# Patient Record
Sex: Female | Born: 1950 | Race: White | Hispanic: No | Marital: Married | Smoking: Never smoker
Health system: Southern US, Community
[De-identification: ages and names within clinical notes are randomized; demographics above are authoritative.]

## PROBLEM LIST (undated history)

## (undated) DIAGNOSIS — G4730 Sleep apnea, unspecified: Secondary | ICD-10-CM

## (undated) DIAGNOSIS — J189 Pneumonia, unspecified organism: Secondary | ICD-10-CM

## (undated) DIAGNOSIS — F0390 Unspecified dementia without behavioral disturbance: Secondary | ICD-10-CM

## (undated) DIAGNOSIS — K08109 Complete loss of teeth, unspecified cause, unspecified class: Secondary | ICD-10-CM

## (undated) DIAGNOSIS — K222 Esophageal obstruction: Secondary | ICD-10-CM

## (undated) DIAGNOSIS — I499 Cardiac arrhythmia, unspecified: Secondary | ICD-10-CM

## (undated) DIAGNOSIS — L723 Sebaceous cyst: Secondary | ICD-10-CM

## (undated) DIAGNOSIS — I480 Paroxysmal atrial fibrillation: Secondary | ICD-10-CM

## (undated) DIAGNOSIS — K219 Gastro-esophageal reflux disease without esophagitis: Secondary | ICD-10-CM

## (undated) DIAGNOSIS — I10 Essential (primary) hypertension: Secondary | ICD-10-CM

## (undated) DIAGNOSIS — I5022 Chronic systolic (congestive) heart failure: Secondary | ICD-10-CM

## (undated) DIAGNOSIS — R0600 Dyspnea, unspecified: Secondary | ICD-10-CM

## (undated) DIAGNOSIS — Z972 Presence of dental prosthetic device (complete) (partial): Secondary | ICD-10-CM

## (undated) HISTORY — PX: ESOPHAGOGASTRODUODENOSCOPY (EGD) WITH ESOPHAGEAL DILATION: SHX5812

## (undated) HISTORY — DX: Gastro-esophageal reflux disease without esophagitis: K21.9

## (undated) HISTORY — PX: BREAST SURGERY: SHX581

## (undated) HISTORY — DX: Sebaceous cyst: L72.3

---

## 1998-06-13 ENCOUNTER — Other Ambulatory Visit: Admission: RE | Admit: 1998-06-13 | Discharge: 1998-06-13 | Payer: Self-pay | Admitting: Obstetrics and Gynecology

## 1998-10-11 ENCOUNTER — Ambulatory Visit (HOSPITAL_COMMUNITY): Admission: RE | Admit: 1998-10-11 | Discharge: 1998-10-11 | Payer: Self-pay | Admitting: Gastroenterology

## 1998-10-11 ENCOUNTER — Encounter: Payer: Self-pay | Admitting: Gastroenterology

## 1998-11-02 ENCOUNTER — Ambulatory Visit (HOSPITAL_COMMUNITY): Admission: RE | Admit: 1998-11-02 | Discharge: 1998-11-02 | Payer: Self-pay | Admitting: Gastroenterology

## 1999-06-20 ENCOUNTER — Ambulatory Visit (HOSPITAL_COMMUNITY): Admission: RE | Admit: 1999-06-20 | Discharge: 1999-06-20 | Payer: Self-pay | Admitting: Gastroenterology

## 1999-07-11 ENCOUNTER — Ambulatory Visit (HOSPITAL_COMMUNITY): Admission: RE | Admit: 1999-07-11 | Discharge: 1999-07-11 | Payer: Self-pay | Admitting: Gastroenterology

## 2000-05-28 ENCOUNTER — Ambulatory Visit (HOSPITAL_COMMUNITY): Admission: RE | Admit: 2000-05-28 | Discharge: 2000-05-28 | Payer: Self-pay | Admitting: Gastroenterology

## 2000-06-03 ENCOUNTER — Encounter: Payer: Self-pay | Admitting: Emergency Medicine

## 2000-06-03 ENCOUNTER — Encounter: Admission: RE | Admit: 2000-06-03 | Discharge: 2000-06-03 | Payer: Self-pay | Admitting: Emergency Medicine

## 2000-06-18 ENCOUNTER — Ambulatory Visit (HOSPITAL_COMMUNITY): Admission: RE | Admit: 2000-06-18 | Discharge: 2000-06-18 | Payer: Self-pay | Admitting: Emergency Medicine

## 2000-07-09 ENCOUNTER — Ambulatory Visit (HOSPITAL_COMMUNITY): Admission: RE | Admit: 2000-07-09 | Discharge: 2000-07-09 | Payer: Self-pay | Admitting: Gastroenterology

## 2001-08-21 ENCOUNTER — Ambulatory Visit (HOSPITAL_COMMUNITY): Admission: RE | Admit: 2001-08-21 | Discharge: 2001-08-21 | Payer: Self-pay | Admitting: Gastroenterology

## 2002-08-17 ENCOUNTER — Other Ambulatory Visit: Admission: RE | Admit: 2002-08-17 | Discharge: 2002-08-17 | Payer: Self-pay | Admitting: Obstetrics and Gynecology

## 2002-09-24 ENCOUNTER — Encounter (INDEPENDENT_AMBULATORY_CARE_PROVIDER_SITE_OTHER): Payer: Self-pay | Admitting: Specialist

## 2002-09-24 ENCOUNTER — Ambulatory Visit (HOSPITAL_COMMUNITY): Admission: RE | Admit: 2002-09-24 | Discharge: 2002-09-24 | Payer: Self-pay | Admitting: Gastroenterology

## 2002-09-24 HISTORY — PX: COLONOSCOPY: SHX174

## 2003-03-23 ENCOUNTER — Ambulatory Visit (HOSPITAL_COMMUNITY): Admission: RE | Admit: 2003-03-23 | Discharge: 2003-03-23 | Payer: Self-pay | Admitting: Gastroenterology

## 2003-06-01 ENCOUNTER — Ambulatory Visit (HOSPITAL_COMMUNITY): Admission: RE | Admit: 2003-06-01 | Discharge: 2003-06-01 | Payer: Self-pay | Admitting: Gastroenterology

## 2003-06-01 HISTORY — PX: ESOPHAGOGASTRODUODENOSCOPY: SHX1529

## 2003-09-14 ENCOUNTER — Other Ambulatory Visit: Admission: RE | Admit: 2003-09-14 | Discharge: 2003-09-14 | Payer: Self-pay | Admitting: Obstetrics and Gynecology

## 2004-08-01 ENCOUNTER — Ambulatory Visit: Payer: Self-pay | Admitting: Family Medicine

## 2004-08-16 ENCOUNTER — Ambulatory Visit: Payer: Self-pay | Admitting: Family Medicine

## 2004-09-25 ENCOUNTER — Other Ambulatory Visit: Admission: RE | Admit: 2004-09-25 | Discharge: 2004-09-25 | Payer: Self-pay | Admitting: Obstetrics and Gynecology

## 2004-10-25 ENCOUNTER — Ambulatory Visit: Payer: Self-pay | Admitting: Family Medicine

## 2004-12-05 ENCOUNTER — Ambulatory Visit: Payer: Self-pay | Admitting: Family Medicine

## 2004-12-13 ENCOUNTER — Encounter: Admission: RE | Admit: 2004-12-13 | Discharge: 2004-12-13 | Payer: Self-pay | Admitting: Family Medicine

## 2005-01-24 ENCOUNTER — Encounter: Admission: RE | Admit: 2005-01-24 | Discharge: 2005-01-24 | Payer: Self-pay | Admitting: Family Medicine

## 2005-01-30 ENCOUNTER — Encounter: Admission: RE | Admit: 2005-01-30 | Discharge: 2005-01-30 | Payer: Self-pay | Admitting: Family Medicine

## 2005-03-19 ENCOUNTER — Encounter: Admission: RE | Admit: 2005-03-19 | Discharge: 2005-03-19 | Payer: Self-pay | Admitting: Family Medicine

## 2005-03-19 IMAGING — US US MFM FOLLOW-UP FOCUS VISIT
1 series · 14 of 22 positions shown · non-contrast
Comparison: none

CLINICAL DATA: One month follow-up following transcatheter occlusion, left great saphenous vein. 
 ULTRASOUND FOLLOW-UP FOCUSED VISIT:
 The patient returns one month following transcatheter occlusion of the left great saphenous vein.   The patient is doing well clinically.  The patient states her pain symptoms have improved significantly.  
 On physical exam, the varicose veins which were noted prior to treatment have nearly completely resolved.  Only scattered spider veins now noted in the left lower extremity.

[Series 1: unknown · 14 of 22 slices shown]
[im 1/22]
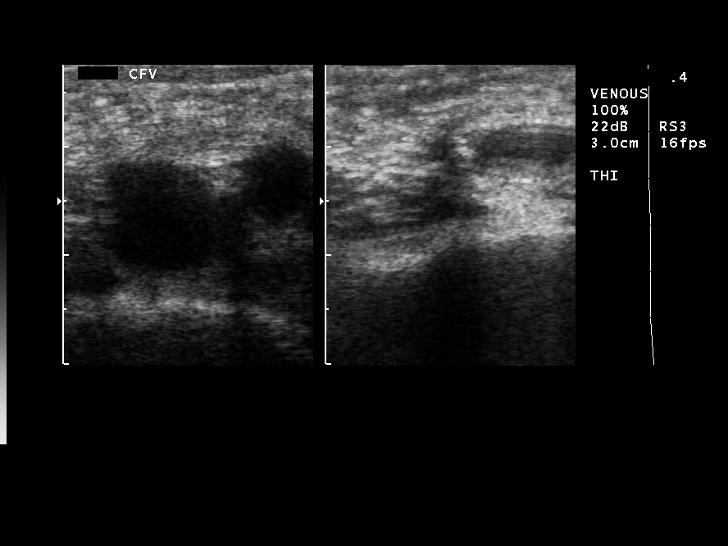
[im 3/22]
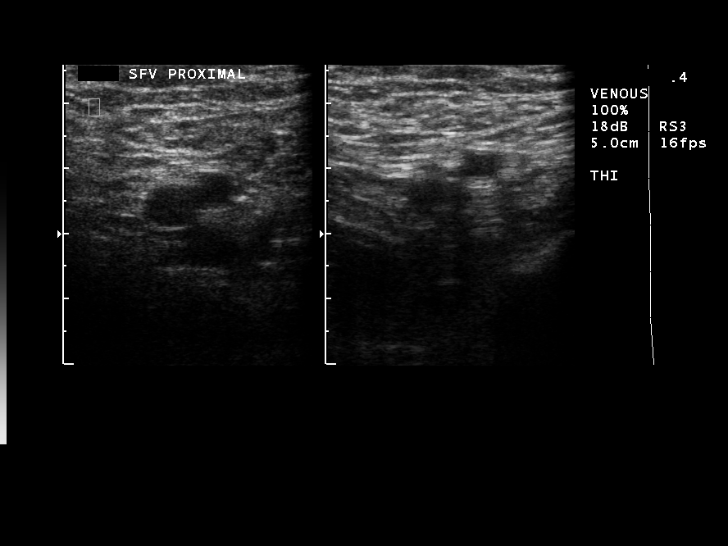
[im 4/22]
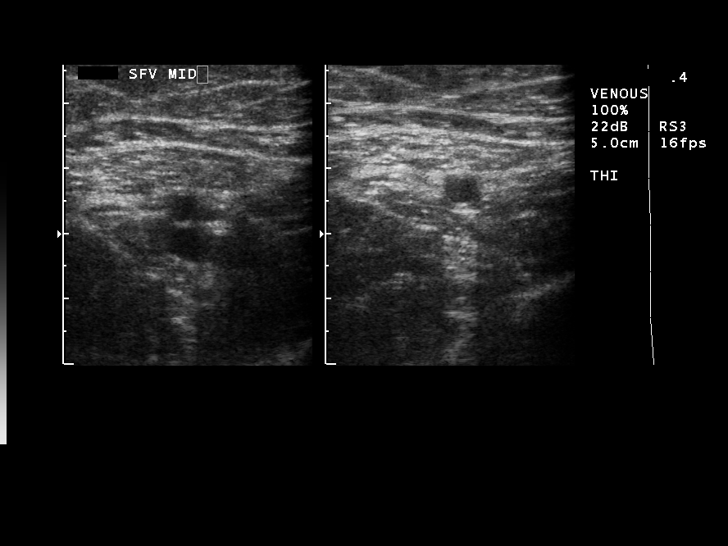
[im 6/22]
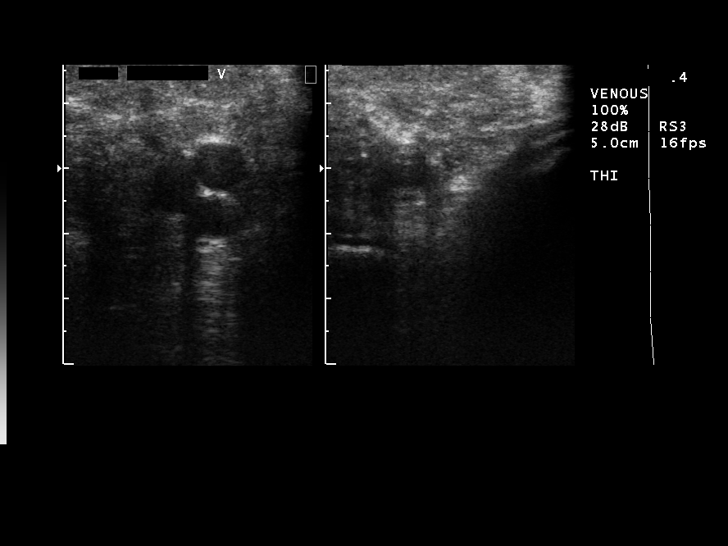
[im 8/22]
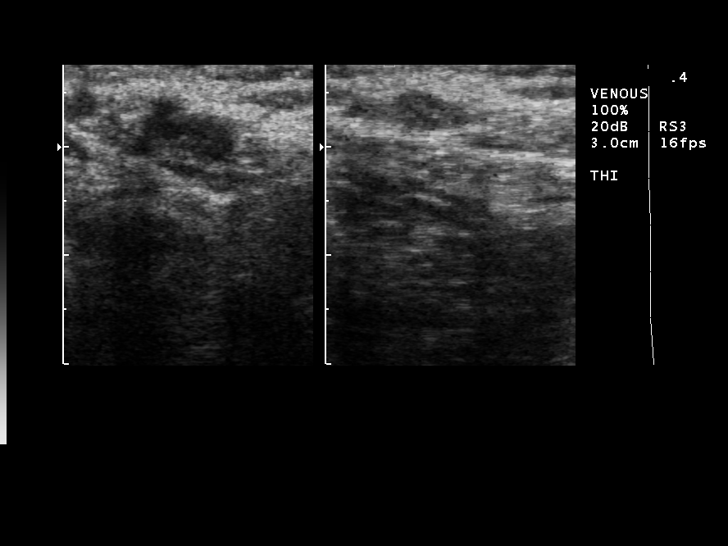
[im 9/22]
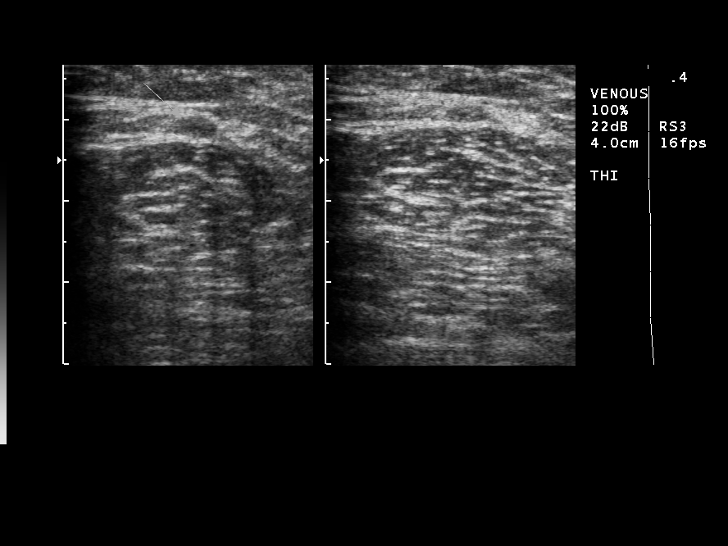
[im 11/22]
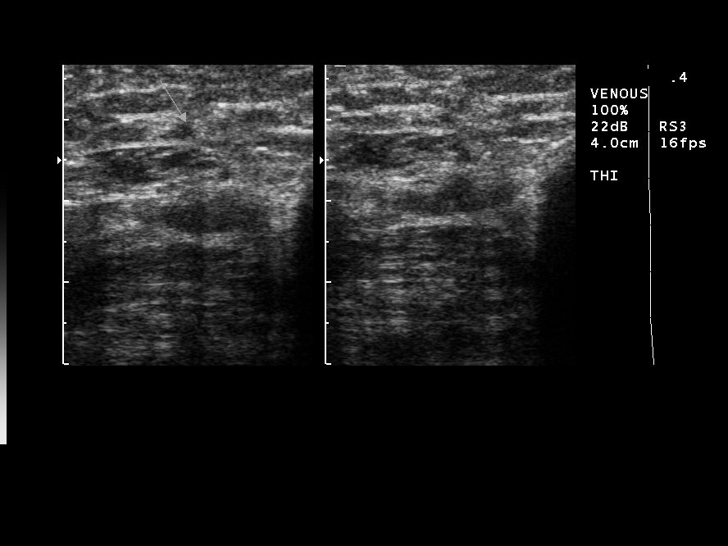
[im 12/22]
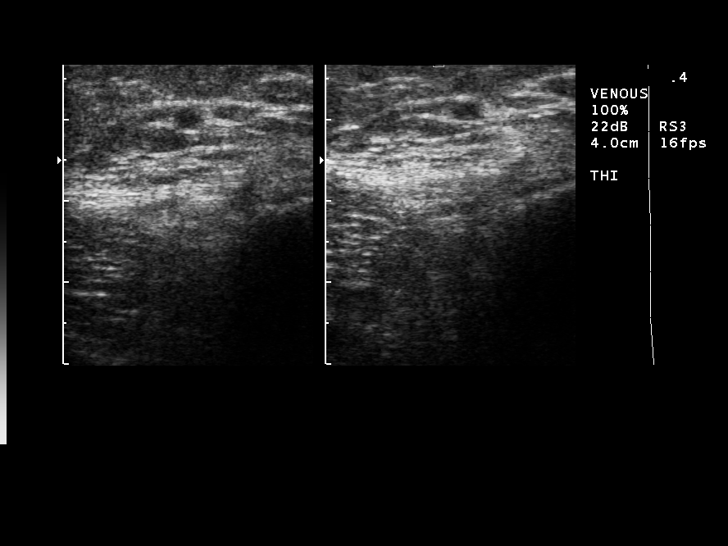
[im 14/22]
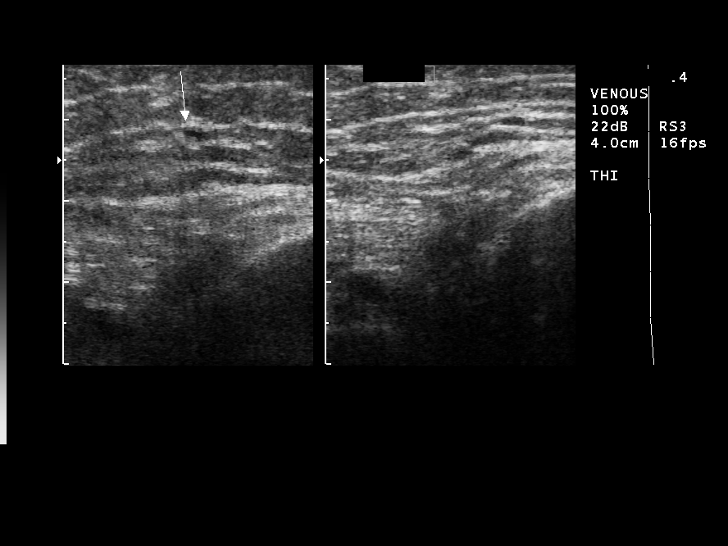
[im 15/22]
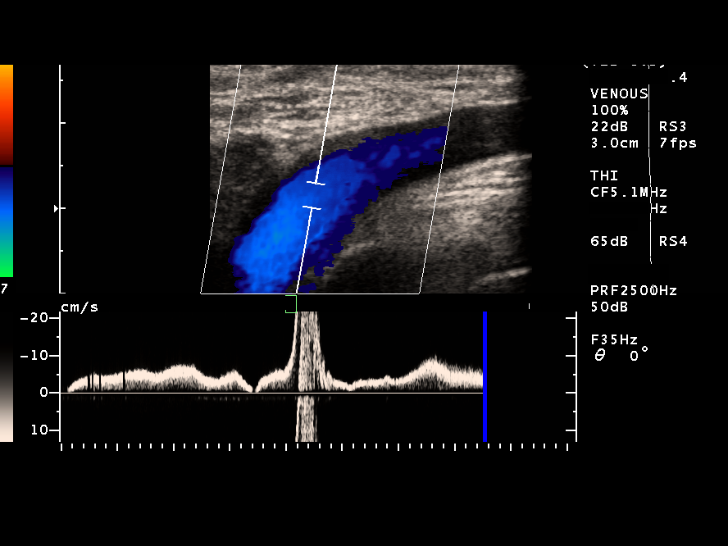
[im 17/22]
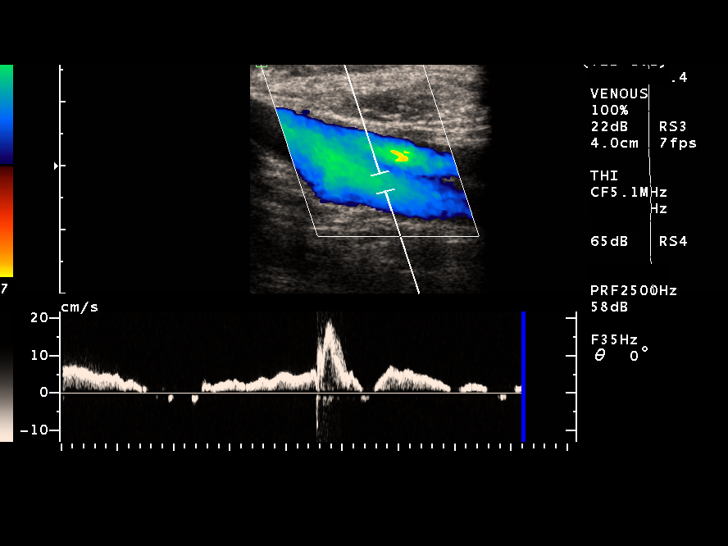
[im 19/22]
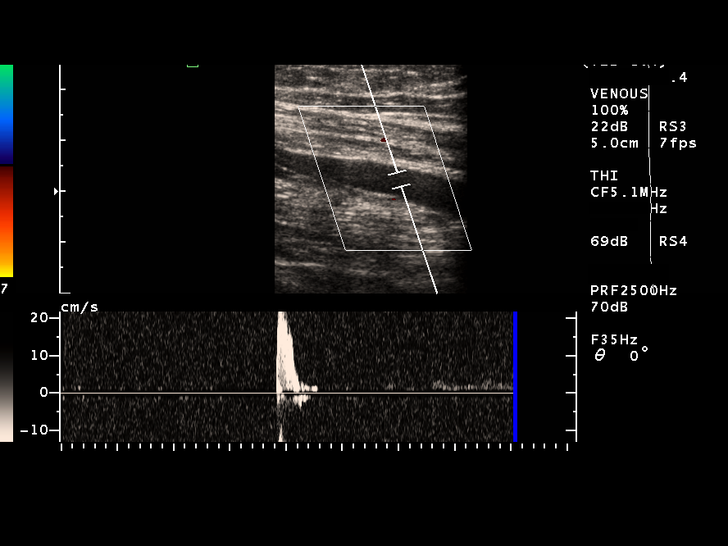
[im 20/22]
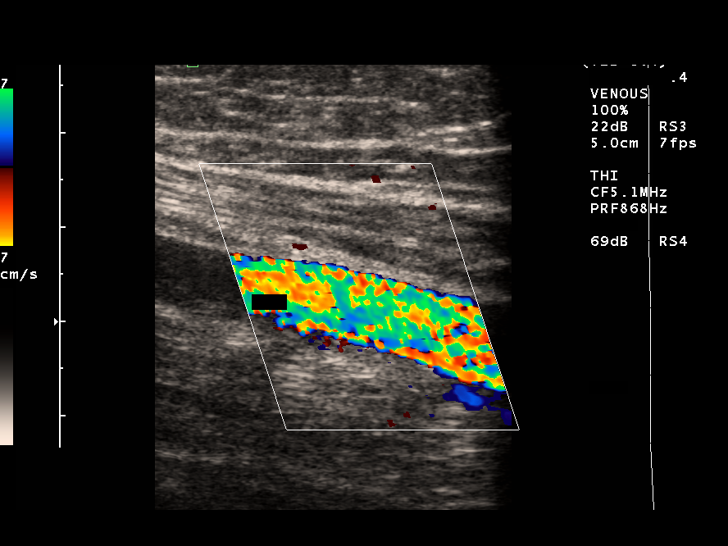
[im 22/22]
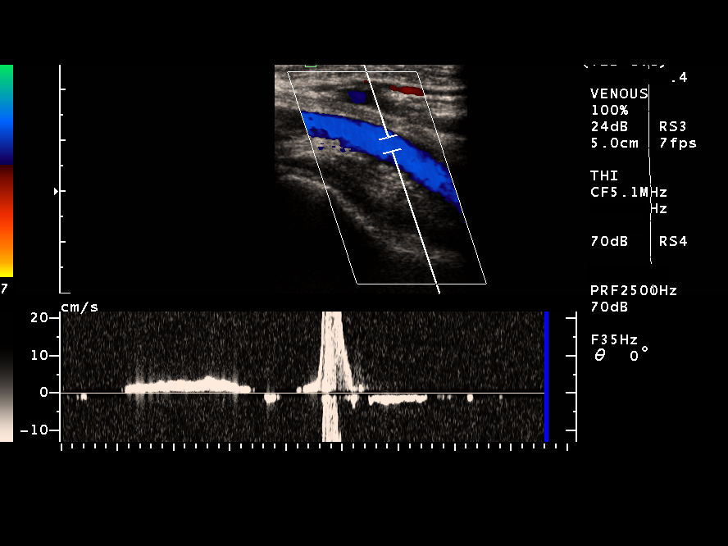

[14 of 22 positions shown; findings below may reference images not displayed]

IMPRESSION: Patient doing well one month following transcatheter occlusion of the left great saphenous vein with near complete resolution of her pain symptoms and varicose veins.  She will be followed-up at six months.

## 2005-08-07 ENCOUNTER — Ambulatory Visit: Payer: Self-pay | Admitting: Family Medicine

## 2005-09-30 ENCOUNTER — Ambulatory Visit: Payer: Self-pay | Admitting: Family Medicine

## 2005-10-14 ENCOUNTER — Ambulatory Visit: Payer: Self-pay | Admitting: Internal Medicine

## 2006-02-17 ENCOUNTER — Encounter (INDEPENDENT_AMBULATORY_CARE_PROVIDER_SITE_OTHER): Payer: Self-pay | Admitting: Specialist

## 2006-02-17 ENCOUNTER — Ambulatory Visit (HOSPITAL_COMMUNITY): Admission: RE | Admit: 2006-02-17 | Discharge: 2006-02-17 | Payer: Self-pay | Admitting: Physician Assistant

## 2006-05-16 ENCOUNTER — Ambulatory Visit (HOSPITAL_COMMUNITY): Admission: RE | Admit: 2006-05-16 | Discharge: 2006-05-16 | Payer: Self-pay | Admitting: Gastroenterology

## 2006-06-11 ENCOUNTER — Ambulatory Visit: Payer: Self-pay | Admitting: Family Medicine

## 2006-06-27 ENCOUNTER — Encounter (INDEPENDENT_AMBULATORY_CARE_PROVIDER_SITE_OTHER): Payer: Self-pay | Admitting: Specialist

## 2006-06-27 ENCOUNTER — Ambulatory Visit (HOSPITAL_COMMUNITY): Admission: RE | Admit: 2006-06-27 | Discharge: 2006-06-27 | Payer: Self-pay | Admitting: Gastroenterology

## 2006-12-03 ENCOUNTER — Ambulatory Visit (HOSPITAL_COMMUNITY): Admission: RE | Admit: 2006-12-03 | Discharge: 2006-12-03 | Payer: Self-pay | Admitting: Gastroenterology

## 2007-04-15 ENCOUNTER — Ambulatory Visit: Payer: Self-pay | Admitting: Family Medicine

## 2007-04-15 DIAGNOSIS — M542 Cervicalgia: Secondary | ICD-10-CM

## 2007-05-05 ENCOUNTER — Encounter: Payer: Self-pay | Admitting: Family Medicine

## 2007-05-08 ENCOUNTER — Ambulatory Visit (HOSPITAL_COMMUNITY): Admission: RE | Admit: 2007-05-08 | Discharge: 2007-05-08 | Payer: Self-pay | Admitting: Gastroenterology

## 2007-06-10 ENCOUNTER — Encounter: Payer: Self-pay | Admitting: Family Medicine

## 2007-06-17 ENCOUNTER — Encounter: Admission: RE | Admit: 2007-06-17 | Discharge: 2007-06-17 | Payer: Self-pay | Admitting: Obstetrics and Gynecology

## 2007-09-28 ENCOUNTER — Telehealth (INDEPENDENT_AMBULATORY_CARE_PROVIDER_SITE_OTHER): Payer: Self-pay | Admitting: *Deleted

## 2007-10-09 ENCOUNTER — Ambulatory Visit (HOSPITAL_COMMUNITY): Admission: RE | Admit: 2007-10-09 | Discharge: 2007-10-09 | Payer: Self-pay | Admitting: Obstetrics and Gynecology

## 2008-04-20 ENCOUNTER — Telehealth: Payer: Self-pay | Admitting: Family Medicine

## 2008-12-20 ENCOUNTER — Ambulatory Visit (HOSPITAL_COMMUNITY): Admission: RE | Admit: 2008-12-20 | Discharge: 2008-12-20 | Payer: Self-pay | Admitting: Obstetrics and Gynecology

## 2009-01-04 ENCOUNTER — Ambulatory Visit: Payer: Self-pay | Admitting: Family Medicine

## 2009-01-04 DIAGNOSIS — M810 Age-related osteoporosis without current pathological fracture: Secondary | ICD-10-CM | POA: Insufficient documentation

## 2009-01-09 ENCOUNTER — Telehealth: Payer: Self-pay | Admitting: Family Medicine

## 2009-03-21 ENCOUNTER — Telehealth: Payer: Self-pay | Admitting: Family Medicine

## 2009-05-17 ENCOUNTER — Telehealth: Payer: Self-pay | Admitting: Family Medicine

## 2009-06-13 ENCOUNTER — Telehealth: Payer: Self-pay | Admitting: Family Medicine

## 2009-06-22 ENCOUNTER — Encounter: Payer: Self-pay | Admitting: Family Medicine

## 2010-03-14 ENCOUNTER — Ambulatory Visit: Payer: Self-pay | Admitting: Family Medicine

## 2010-03-14 LAB — CONVERTED CEMR LAB
Glucose, Urine, Semiquant: NEGATIVE
Nitrite: NEGATIVE
Specific Gravity, Urine: >=1.03
WBC Urine, dipstick: NEGATIVE

## 2010-03-15 LAB — CONVERTED CEMR LAB
AST: 16 units/L (ref 0–37)
Albumin: 3.7 g/dL (ref 3.5–5.2)
Basophils Absolute: 0 10*3/uL (ref 0.0–0.1)
CO2: 28 meq/L (ref 19–32)
Chloride: 106 meq/L (ref 96–112)
GFR calc non Af Amer: 92.05 mL/min (ref >60)
Glucose, Bld: 87 mg/dL (ref 70–99)
HCT: 38.3 % (ref 36.0–46.0)
Hemoglobin: 12.7 g/dL (ref 12.0–15.0)
Lymphs Abs: 1.7 10*3/uL (ref 0.7–4.0)
MCHC: 33.1 g/dL (ref 30.0–36.0)
MCV: 90.7 fL (ref 78.0–100.0)
Monocytes Relative: 9.7 % (ref 3.0–12.0)
Neutro Abs: 3.1 10*3/uL (ref 1.4–7.7)
Potassium: 4.4 meq/L (ref 3.5–5.1)
RDW: 12.9 % (ref 11.5–14.6)
Sodium: 141 meq/L (ref 135–145)
TSH: 1.36 microintl units/mL (ref 0.35–5.50)

## 2010-03-20 ENCOUNTER — Ambulatory Visit: Payer: Self-pay | Admitting: Family Medicine

## 2010-03-20 ENCOUNTER — Encounter: Payer: Self-pay | Admitting: Family Medicine

## 2010-03-28 ENCOUNTER — Ambulatory Visit (HOSPITAL_COMMUNITY)
Admission: RE | Admit: 2010-03-28 | Discharge: 2010-03-28 | Payer: Self-pay | Source: Home / Self Care | Attending: Obstetrics and Gynecology | Admitting: Obstetrics and Gynecology

## 2010-04-04 ENCOUNTER — Telehealth: Payer: Self-pay | Admitting: Family Medicine

## 2010-05-05 ENCOUNTER — Encounter: Payer: Self-pay | Admitting: Family Medicine

## 2010-05-17 NOTE — Progress Notes (Signed)
Summary: Pt req med change from Prevacid  to generic Solutabs  Phone Note Call from Patient Call back at [facility] Phone [phone]   Caller: Patient Summary of Call: Pt req change of meds from Prevacid to generic Solutabs. Please call in to [facility].  Initial call taken by: Lucy Antigua,  May 17, 2009 9:13 AM  Follow-up for Phone Call        Phone Call Completed, Rx Called In Follow-up by: Alfred Levins, CMA,  May 17, 2009 3:31 PM    New/Updated Medications: PREVACID SOLUTAB 30 MG TBDP (LANSOPRAZOLE) once daily Prescriptions: PREVACID SOLUTAB 30 MG TBDP (LANSOPRAZOLE) once daily  #30 x 11   Entered by:   Alfred Levins, CMA   Authorized by:   Nelwyn Salisbury MD   Signed by:   Alfred Levins, CMA on 05/17/2009   Method used:   [facility]  [place] [address] [facility]       [address]       [place], [place]  [address]       Ph: [phone] or [phone]       Fax: [phone]   RxID:   [id number]   Appended Document: Pt req med change from Prevacid  to generic Solutabs    Prescriptions: PREVACID SOLUTAB 30 MG TBDP (LANSOPRAZOLE) 1 by mouth two times a day  #30 x 11   Entered by:   Alfred Levins, CMA   Authorized by:   Nelwyn Salisbury MD   Signed by:   Alfred Levins, CMA on 05/23/2009   Method used:   [facility]  [place] [address]       [address]       [place], [place]  [address]       Ph: [phone] or [phone]       Fax: [phone]   RxID:   [id number]     Appended Document: Pt req med change from Prevacid  to generic Solutabs    Prescriptions: PREVACID SOLUTAB 30 MG TBDP (LANSOPRAZOLE) 1 by mouth two times a day  #60 x 11   Entered by:   Alfred Levins, CMA   Authorized by:   Nelwyn Salisbury MD   Signed by:   Alfred Levins, CMA on 05/23/2009   Method used:   [facility]  [place] [address]       [address]       [place], [place]  [address]       Ph:  [phone] or [phone]       Fax: [phone]   RxID:   [id number]

## 2010-05-17 NOTE — Progress Notes (Signed)
Summary: Pt req referral for a doctor that can remove cyst  Phone Note Call from Patient Call back at [phone] cell   Caller: Patient Summary of Call: Pt called and said that she has a cyst on back on neck, and during last ov, Dr Clent Ridges mentioned about getting a referral for doctor to remove cyst. Pt req referral. Pts insurance has changed to [facility].  Initial call taken by: Lucy Antigua,  June 13, 2009 12:50 PM  Follow-up for Phone Call        refer to Surgery for a cyst on the neck Follow-up by: Nelwyn Salisbury MD,  June 13, 2009 1:10 PM  Additional Follow-up for Phone Call Additional follow up Details #1::        Phone Call Completed Additional Follow-up by: Alfred Levins, CMA,  June 13, 2009 3:53 PM

## 2010-05-17 NOTE — Letter (Signed)
Summary: [facility]   Imported By: Maryln Gottron 07/11/2009 14:36:54  _____________________________________________________________________  External Attachment:    Type:   Image     Comment:   External Document

## 2010-05-17 NOTE — Assessment & Plan Note (Signed)
Summary: CPX // RS   Vital Signs:  Patient profile:   60 year old female Height:      60 inches Weight:      108.06 pounds BMI:     21.18 O2 Sat:      97 % on Room air Temp:     98.6 degrees F oral Pulse rate:   81 / minute BP sitting:   118 / 78  (left arm) Cuff size:   regular  Vitals Entered By: Margaret Pyle, CMA (March 20, 2010 9:07 AM)  O2 Flow:  Room air CC: CPX Comments Pt is also taking ASA 81mg , Calcium and Vit D supplements   History of Present Illness: 60 yr old female for a cpx. She is doing well in general, with her only complaint being low back pain for 2 weeks. She has been doing very demanding work with stripping Scientist, water quality. She has sharp pain in the left lower back which runs down the back of her left leg to the calf. This has gotten better in the past few days with taking some Motrin.   Preventive Screening-Counseling & Management  Alcohol-Tobacco     Smoking Status: never  Allergies (verified): No Known Drug Allergies  Past History:  Past Medical History: GERD, sees Dr. Loreta Ave Osteoporosis per Dr. Henderson Cloud, gets Reclast yearly. Last DEXA was on 06-10-07 sees Dr. Henderson Cloud for GYN exams has a sebaceous cyst on the back of the neck, has seen Dr. Bertram Savin  Past Surgical History: pharyngeal dilatation 8-09 per Dr. Christia Reading EGD with esophageal dilatation 05-20-07  Family History: Reviewed history and no changes required. unremarkable  Social History: Reviewed history and no changes required. Married Never Smoked Alcohol use-no Smoking Status:  never  Review of Systems  The patient denies anorexia, fever, weight loss, weight gain, vision loss, decreased hearing, hoarseness, chest pain, syncope, dyspnea on exertion, peripheral edema, prolonged cough, headaches, hemoptysis, abdominal pain, melena, hematochezia, severe indigestion/heartburn, hematuria, incontinence, genital sores, muscle weakness, suspicious skin  lesions, transient blindness, difficulty walking, depression, unusual weight change, abnormal bleeding, enlarged lymph nodes, angioedema, breast masses, and testicular masses.    Physical Exam  General:  Well-developed,well-nourished,in no acute distress; alert,appropriate and cooperative throughout examination Head:  Normocephalic and atraumatic without obvious abnormalities. No apparent alopecia or balding. Eyes:  No corneal or conjunctival inflammation noted. EOMI. Perrla. Funduscopic exam benign, without hemorrhages, exudates or papilledema. Vision grossly normal. Ears:  External ear exam shows no significant lesions or deformities.  Otoscopic examination reveals clear canals, tympanic membranes are intact bilaterally without bulging, retraction, inflammation or discharge. Hearing is grossly normal bilaterally. Nose:  External nasal examination shows no deformity or inflammation. Nasal mucosa are pink and moist without lesions or exudates. Mouth:  Oral mucosa and oropharynx without lesions or exudates.  Teeth in good repair. Neck:  No deformities, masses, or tenderness noted. Chest Wall:  No deformities, masses, or tenderness noted. Lungs:  Normal respiratory effort, chest expands symmetrically. Lungs are clear to auscultation, no crackles or wheezes. Heart:  Normal rate and regular rhythm. S1 and S2 normal without gallop, murmur, click, rub or other extra sounds. EKG normal  Abdomen:  Bowel sounds positive,abdomen soft and non-tender without masses, organomegaly or hernias noted. Msk:  No deformity or scoliosis noted of thoracic or lumbar spine.  No tenderness, full ROM  Pulses:  R and L carotid,radial,femoral,dorsalis pedis and posterior tibial pulses are full and equal bilaterally Extremities:  No clubbing, cyanosis, edema, or deformity  noted with normal full range of motion of all joints.   Neurologic:  No cranial nerve deficits noted. Station and gait are normal. Plantar reflexes are  down-going bilaterally. DTRs are symmetrical throughout. Sensory, motor and coordinative functions appear intact. Skin:  Intact without suspicious lesions or rashes Cervical Nodes:  No lymphadenopathy noted Axillary Nodes:  No palpable lymphadenopathy Inguinal Nodes:  No significant adenopathy Psych:  Cognition and judgment appear intact. Alert and cooperative with normal attention span and concentration. No apparent delusions, illusions, hallucinations   Impression & Recommendations:  Problem # 1:  HEALTH MAINTENANCE EXAM (ICD-V70.0)  Orders: EKG w/ Interpretation (93000)  Complete Medication List: 1)  Prevacid Solutab 30 Mg Tbdp (Lansoprazole) .Marland Kitchen.. 1 by mouth two times a day  Patient Instructions: 1)  Please schedule a follow-up appointment in 1 year.  2)  she is not sure when her last colonoscopy was, so she will ask Dr. Kenna Gilbert office.   Orders Added: 1)  Est. Patient 40-64 years [99396] 2)  EKG w/ Interpretation [93000]   Immunization History:  Influenza Immunization History:    Influenza:  historical (01/13/2010)   Immunization History:  Influenza Immunization History:    Influenza:  Historical (01/13/2010)

## 2010-05-17 NOTE — Progress Notes (Signed)
Summary: something cheaper  Phone Note Call from Patient   Caller: Patient Call For: Nelwyn Salisbury MD Summary of Call: pt can no longer afford prevacid at 50.00 copay, Pt would like something else [phone] Initial call taken by: Heron Sabins,  April 04, 2010 12:28 PM  Follow-up for Phone Call        switch to Omeprazole 40 mg a day, call in one year supply Follow-up by: Nelwyn Salisbury MD,  April 04, 2010 1:31 PM    New/Updated Medications: OMEPRAZOLE 40 MG CPDR (OMEPRAZOLE) 1 by mouth once daily Prescriptions: OMEPRAZOLE 40 MG CPDR (OMEPRAZOLE) 1 by mouth once daily  #30 x 11   Entered by:   Pura Spice, RN   Authorized by:   Nelwyn Salisbury MD   Signed by:   Pura Spice, RN on 04/04/2010   Method used:   [facility]  [place] [address]       [address]       [place], [place]  [address]       Ph: [phone] or [phone]       Fax: [phone]   RxID:   [id number]

## 2010-06-18 ENCOUNTER — Ambulatory Visit (HOSPITAL_BASED_OUTPATIENT_CLINIC_OR_DEPARTMENT_OTHER)
Admission: RE | Admit: 2010-06-18 | Discharge: 2010-06-18 | Disposition: A | Discharge status: Home / Self Care | Payer: BC Managed Care – PPO | Source: Ambulatory Visit | Attending: General Surgery | Admitting: General Surgery

## 2010-06-18 DIAGNOSIS — L723 Sebaceous cyst: Secondary | ICD-10-CM | POA: Insufficient documentation

## 2010-08-28 NOTE — Op Note (Signed)
NAMEVERNE, COVE               ACCOUNT NO.:  [id number]   MEDICAL RECORD NO.:  [id number]          PATIENT TYPE:  AMB   LOCATION:  ENDO                         [facility]ACILITY[facility]  [facility]   PHYSICIAN:  Anselmo Rod, M.D.  DATE OF BIRTH:  05-27-1950   DATE OF PROCEDURE:  12/03/2006  DATE OF DISCHARGE:                               OPERATIVE REPORT   PROCEDURE PERFORMED:  Esophagogastroduodenoscopy with balloon dilatation  of the UES stricture.   ENDOSCOPIST:  Anselmo Rod.   INSTRUMENT USED:  Pentax video panendoscope.   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   PREPROCEDURE PREPARATION:  Informed consent was procured from the  patient.  The patient fasted for 8 hours prior to procedure.  Risks and  benefits of the procedure were discussed with the patient in detail.   PREPROCEDURE PHYSICAL:  The patient had stable vital signs.  NECK:  Supple.  CHEST:  Clear to auscultation.  S1/S2 regular.  ABDOMEN:  Soft with normal bowel sounds.   DESCRIPTION OF PROCEDURE:  The patient was placed in left lateral  decubitus position and sedated with 100 mcg of Fentanyl and 10 mg of  Versed given intravenously in slow incremental doses. Once the patient  was adequately sedated and maintained on low-flow oxygen and continuous  cardiac monitoring, the Pentax video panendoscope was advanced through  the mouthpiece, over the tongue into the esophagus and with difficulty  at the UES site. There was slight hesitation to passage of scope which  ultimately passed.  The rest of the esophagus was widely patent.  GE  junction and Z-line appeared healthy. The entire gastric mucosa in the  proximal small bowel appeared normal.  No abnormalities were noted on  high retroflexion. The scope was then pulled back into this esophagus,  and the  UES stricture was identified and dilated with controlled radial  expansion balloon dilator measuring 10 mm, 11 mm and 12 mm sequentially  with good results.  The patient tolerated the procedure well without  complications.  There was minimal heme at the site of dilatation after  advancing the scope initially to the UES.   IMPRESSION:  Upper esophageal sphincter stricture dilated with balloon  dilator.  Otherwise normal esophagogastroduodenoscopy.   RECOMMENDATIONS:  1. Continue Prevacid.  2. Call the office as needed for further problems with dysphagia.      Anselmo Rod, M.D.  Electronically Signed     JNM/MEDQ  D:  12/03/2006  T:  12/04/2006  Job:  [id number]

## 2010-08-31 NOTE — Op Note (Signed)
NAMEYVANNA, VIDAS                         ACCOUNT NO.:  [id number]   MEDICAL RECORD NO.:  [id number]                   PATIENT TYPE:  AMB   LOCATION:  ENDO                                 FACILITY:  [facility]   PHYSICIAN:  Anselmo Rod, M.D.               DATE OF BIRTH:  1951/02/27   DATE OF PROCEDURE:  06/01/2003  DATE OF DISCHARGE:                                 OPERATIVE REPORT   PROCEDURE:  Esophagogastroduodenoscopy.   ENDOSCOPIST:  Charna Elizabeth, M.D.   INSTRUMENT USED:  Olympus video panendoscope.   INDICATIONS FOR PROCEDURE:  60 year old white female with a stricture in the  upper esophagus undergoing repeat EGD for recurrent dysphagia and dilatation  planned if needed.   PREPROCEDURE PREPARATION:  Informed consent was procured from the patient.  The patient was fasted for eight hours prior to the procedure.   PREPROCEDURE PHYSICAL:  Patient with stable vital signs.  Neck supple.  Chest clear to auscultation.  S1 and S2 regular.  Abdomen soft with normal  bowel sounds.   DESCRIPTION OF PROCEDURE:  The patient was placed in the left lateral  decubitus position, sedated with 70 mg of Demerol and 8 mg Versed  intravenously.  Once the patient was adequately sedated, maintained on low  flow oxygen and continuous cardiac monitoring, the Olympus video  panendoscope was advanced through the mouth piece over the tongue into the  esophagus with some difficulty.  There was stricturing of the EUS.  The  scope was somewhat difficult to pass with mild bleeding noted after the  scope was passed through the esophagus.  Otherwise, it was normal.  The Z-  line appeared healthy.  Retroflexion in the high cardia revealed a small  hiatal hernia.  There was diffuse gastritis noted but no ulcers were  identified.  The proximal small bowel appeared normal.   IMPRESSION:  1. Stricture in the proximal esophagus spontaneously dilated with passage of     scope.  2. Small hiatal hernia.  3.  Diffuse gastritis.  4. Normal proximal small bowel.   RECOMMENDATIONS:  1. Continue Nexium.  2. Barium swallow in the next 6-8 weeks.  3. Liberal fluid intake with meals.  4. Chew meats carefully and eat slowly.  5. Further recommendations made in follow up.                                               Anselmo Rod, M.D.    JNM/MEDQ  D:  06/01/2003  T:  06/01/2003  Job:  [id number]   cc:   Reuben Likes, M.D.  [address]  [place]  [place] [address]  Fax: [phone]

## 2010-08-31 NOTE — Op Note (Signed)
Mackenzie [name], BEZOLD               ACCOUNT NO.:  [id number]   MEDICAL RECORD NO.:  [id number]          PATIENT TYPE:  AMB   LOCATION:  ENDO                         FACILITY:  [facility]   PHYSICIAN:  Anselmo Rod, M.D.  DATE OF BIRTH:  1950/07/11   DATE OF PROCEDURE:  02/17/2006  DATE OF DISCHARGE:  02/17/2006                                 OPERATIVE REPORT   PROCEDURE PERFORMED:  Esophagogastroduodenoscopy with spontaneous dilatation  of a stricture at the upper esophageal sphincter.   PREPROCEDURE PREPARATION:  Informed consent was procured from the patient.  The patient fasted for 8 hours prior to the procedure.  Risks and benefits  of the procedure were discussed with her in great detail.  The patient has a  history of a stricture at the UES which has been dilated in the past.  Patient had recurrent dysphagia and therefore repeat EGD with possible  dilatation is planned.   PREPROCEDURE PHYSICAL:  The patient had stable vital signs.  NECK:  Supple.  CHEST:  Clear to auscultation.  S1, S2 regular.  ABDOMEN:  Soft with normal bowel sounds.   DESCRIPTION OF THE PROCEDURE:  The patient was placed in the left lateral  decubitus position, sedated with 75 mcg of fentanyl and 7.5 mg of Versed in  slow incremental doses.  Once the patient was adequately sedated and  maintained on low flow oxygen and continuous cardiac monitoring.  The  Olympus video pan endoscope was advanced through the mouthpiece, over the  tongue, into the esophagus with slight difficulty.  There was some  hesitation at the UES and when the scope was gently pushed into the upper  esophagus it seemed to have interrupted a possible web.  There seemed to  be spontaneous dilatation of this area with passage of scope and some  minimal bleeding was noted.  The rest of the gastric mucosa appeared normal.  Submucosal nodularity was noted in the proximal stomach of unclear  significance.  Proximal small bowel biopsies were  done to rule out sprue,  which may be a reason why the patient has an esophageal web.  There was no  outlet obstruction.  The patient tolerated the procedure well without  immediate complications.  As there was some bleeding with spontaneous  dilatation of the UES, further dilatation was not undertaken for the fear of  causing complications related to perforation or bleeding.   IMPRESSION:  1. Web versus stricture at upper esophageal stricture spontaneously      dilated with passage of a scope.  Minimal bleeding noted.  2. Submucosal nodularity in proximal stomach, question significance.  3. Small bowel biopsy done to rule out sprue.   RECOMMENDATIONS:  1. Await pathology result.  2. Soft diet for the next 2-3 days.  3. Carafate suspension samples have been given to the patient to take in      between meals and at bedtime.  4. Outpatient followup in the next 2 weeks for further recommendations.      Anselmo Rod, M.D.  Electronically Signed     JNM/MEDQ  D:  02/19/2006  T:  02/20/2006  Job:  [id number]   cc:   Tera Mater. Clent Ridges, MD

## 2010-08-31 NOTE — Procedures (Signed)
[place]. [facility]  Patient:    Mackenzie [name], Mackenzie [name] Visit Number: [id number] MRN: [id number]          Service Type: END Location: ENDO Attending Physician:  Charna Elizabeth Dictated by:   Anselmo Rod, M.D. Proc. Date: 08/21/01 Admit Date:  08/21/2001 Discharge Date: 08/21/2001   CC:         Reuben Likes, M.D.   Procedure Report  DATE OF BIRTH:  1951-02-10.  PROCEDURE:  Esophagogastroduodenoscopy with dilatation of an upper esophagus stricture.  ENDOSCOPIST:  Anselmo Rod, M.D.  INSTRUMENT USED:  Olympus video panendoscope and Savary dilators.  INDICATION FOR PROCEDURE:  Severe dysphagia in a 60 year old white female. Rule out recurrent stricture.  Dilatation planned.  PREPROCEDURE PREPARATION:  Informed consent was procured from the patient. The patient was fasted for eight hours prior to the procedure.  PREPROCEDURE PHYSICAL:  VITAL SIGNS:  The patient had stable vital signs.  NECK:  Supple.  CHEST:  Clear to auscultation.  S1, S2 regular.  ABDOMEN:  Soft with normal bowel sounds.  DESCRIPTION OF PROCEDURE:  The patient was placed in the left lateral decubitus position and sedated with 50 mg of Demerol and 10 mg of Versed intravenously.  Once the patient was adequately sedate and maintained on low-flow oxygen and continuous cardiac monitoring, the Olympus video panendoscope was advanced through the mouthpiece, over the tongue, into the esophagus under direct vision.  The entire esophagus appeared normal except for a stricture at the UES.  Gastric mucosa appeared nodular in configuration, but no ulcers were seen.  A guidewire was placed on the antrum, and the scope was withdrawn.  Serial dilators were used, Savary dilator sizes 12, 12.8, 14, and 15 mm were used to dilate the upper esophageal sphincter.  Repeat endoscopy was done to check this.  There was a minimal amount of heme at the site of dilatation.  The patient tolerated  the procedure well without complication.  IMPRESSION: 1. Upper esophageal sphincter stricture dilated with Savary dilators. 2. Mild atrophic gastritis.  RECOMMENDATIONS: 1. Continue PPIs. 2. Soft diet for the next three to four days. 3. Outpatient follow-up on a p.r.n. basis. 4. Avoid all nonsteroidals including aspirin. Dictated by:   Anselmo Rod, M.D. Attending Physician:  Charna Elizabeth DD:  08/21/01 TD:  08/24/01 Job: [id number]

## 2010-08-31 NOTE — Procedures (Signed)
[place]. [facility]  Patient:    Mackenzie [name], Mackenzie [name]                      MRN: [id number] Proc. Date: 07/11/99 Adm. Date:  [id number] Attending:  Charna Elizabeth CC:         Reuben Likes, M.D.                           Procedure Report  DATE OF BIRTH:  [birth year]  REFERRING PHYSICIAN:  Reuben Likes, M.D.  PROCEDURE PERFORMED:  Esophagogastroduodenoscopy with Savary dilatation of a high esophageal stricture.  ENDOSCOPIST:  Anselmo Rod, M.D.  INSTRUMENT USED:  Olympus video panendoscope.  INDICATIONS:  Dysphagia in a 59 year old white female, who has stricture at 10 m. Patient underwent Savary dilatation up to 16 mm on June 20, 1999.  This has recurred with symptoms when she tried to eat bacon recently.  Therefore, repeat EGD with dilatation was planned.  PREPROCEDURE PHYSICAL:  Patient has stable vital signs.  NECK:  Supple.  CHEST:  Clear to auscultation. S1, S2 regular.  ABDOMEN:  Soft with normal abdominal bowel sounds.  DESCRIPTION OF PROCEDURE:  The patient was placed in left lateral decubitus position and sedated with 100 mg of Demerol and 5 mg of Versed intravenously. nce the patient was adequately sedated and maintained on low-flow oxygen and continuous cardiac monitoring, the Olympus video panendoscope was advanced through the mouth piece, over the tongue into the esophagus under direct vision.  A stricture was  seen at the UES at 10 cm that was dilated with Savary dilators measuring 12, 15, 17, 19 and 20 mm.  There was no heme on the returning dilator and patient tolerated the procedure well.  Dilatation was done in a routine manner after placing the guidewire in the antrum.  The rest of the esophagus and the stomach, including proximal small bowel distal appeared normal.  IMPRESSION: 1. High stricture in the esophagus at the level of the UES (10 cm) dilated with  Savary dilators. 2. Normal-appearing stomach and  proximal small bowel.  RECOMMENDATIONS: 1. Continue soft diet for now. 2. Continue with Carafate slurry 1 g q.i.d. for the next two weeks. 3. Prevacid 30 mg 1 p.o. q.d. 4. Avoid all nonsteroidals. 5. Outpatient follow-up in the next two weeks. DD:  07/11/99 TD:  07/11/99 Job: [id number]

## 2010-08-31 NOTE — Procedures (Signed)
[place]  Patient:    Mackenzie [name], Mackenzie [name]                      MRN: [id number] Proc. Date: 07/09/00 Adm. Date:  [id number] Attending:  Charna Elizabeth CC:         Reuben Likes, M.D.   Procedure Report  DATE OF BIRTH:  Mar 19, 1951  PROCEDURE PERFORMED:  Esophagogastroduodenoscopy with Savory dilatation of a high esophageal stricture.  ENDOSCOPIST:  Anselmo Rod, M.D.  INSTRUMENT USED:  Olympus video panendoscope and Savory dilators.  INDICATION FOR PROCEDURE:  Recurrent dysphagia in a 60 year old white female with a high UES stricture.  Recent dilatation done did not help the patient much.  The patient had recurrence of her symptoms within two to three weeks of the dilatation.  More aggressive dilatation is planned this time.  PREPROCEDURE PREPARATION:  Informed consent was procured from the patient. The patient was fasted for eight hours prior to the procedure.  PREPROCEDURE PHYSICAL:  VITAL SIGNS:  The patient had stable vital signs.  NECK:  Supple.  CHEST:  Clear to auscultation.  S1 and S2 normal.  ABDOMEN:  Soft with normal abdominal bowel sounds.  DESCRIPTION OF PROCEDURE:  The patient was placed in the left lateral decubitus position and sedated with 50 mg of Demerol and 4 mg of Versed intravenously.  Once the patient was adequately sedated and maintained on low flow oxygen and continuous cardiac monitoring, the Olympus video panendoscope was advanced through the mouth piece over the tongue into the esophagus under direct vision.  The UES stricture seemed to be quite patent.  The rest of the distal esophagus appeared normal.  There was mild diffuse gastritis about the gastric mucosa with normal appearing proximal small bowel up to 60 cm.  After finishing an examination of the upper GI tract, a guide wire was passed in the routine manner and the UES stricture was dilated with 14, 19, and 20 [place] Savory dilators.  There was  some heme seen on the last dilator.  Repeat endoscopy was done and there was small amount of bleeding at the site of the dilatation.  The patient tolerated the procedure well and had no postoperative complications.  IMPRESSION: 1. High esophageal stricture dilated with Savory dilators. 2. Mild diffuse gastritis. 3. Normal proximal small bowel.  RECOMMENDATIONS: 1. Carafate slurry 1 gm q.i.d. will be prescribed for the patient for    the next 15 days. 2. Soft diet is to be maintained for next four to five days. 3. She is to continue her Protonix 40 mg one p.o. q.d. 4. Outpatient follow up is advised in the next two weeks or earlier if    need be. 5. Antireflux measures to be aggressively followed. DD:  07/09/00 TD:  07/10/00 Job: [id number]

## 2010-08-31 NOTE — Op Note (Signed)
[name], Mackenzie [name]               ACCOUNT NO.:  [id number]   MEDICAL RECORD NO.:  [id number]          PATIENT TYPE:  AMB   LOCATION:  ENDO                         FACILITY:  [facility]   PHYSICIAN:  Anselmo Rod, M.D.  DATE OF BIRTH:  Aug 29, 1950   DATE OF PROCEDURE:  06/27/2006  DATE OF DISCHARGE:                               OPERATIVE REPORT   PROCEDURE PERFORMED:  Esophagogastroduodenoscopy with balloon dilatation  of the upper esophageal sphincter.   ENDOSCOPIST:  Anselmo Rod, M.D.   INSTRUMENT USED:  Pentax video panendoscope.   INDICATIONS FOR PROCEDURE:  60 year old white female with recurrent  dysphagia and history of a UES.  She is undergoing a repeat EGD with  dilatation for recurrent dysphagia.  Balloon dilatation is planned.   PRE-PROCEDURE PREPARATION:  Informed consent was procured from the  patient.  The risks and benefits of the procedure including perforation  requiring surgery were discussed with the patient in great detail.   PRE-PROCEDURE PHYSICAL:  Patient with stable vital signs.  Neck supple.  Chest clear to auscultation.  S1 and S2 regular.  Abdomen soft with  normal bowel sounds.   DESCRIPTION OF PROCEDURE:  The patient was placed in the left lateral  decubitus position and sedated with 100 mcg of fentanyl and 10 mg of  Versed given intravenously in slow incremental doses.  Once the patient  was adequately sedated and maintained on low flow oxygen and continuous  cardiac monitoring, the Pentax video panendoscope was advanced through  the mouthpiece over the tongue into the esophagus under direct vision.  There was some hesitation at the EUS to the passage of the scope.  The  scope was then gently advanced in the stomach. The gastric mucosa seemed  slightly inflamed, mild gastritis was noted.  The proximal small bowel  was examined.  There was evidence of focal erythema. Biopsies were done  to rule out the presence of sprue. A 15-16 mm controlled  radial  expansion balloon dilator was used and the UES was dilated.  The balloon  was held in position at 15 mm dilatation for about 3 minutes and again  at 16 mm for about 3 minutes.  There was no bleeding at the site of the  dilatation. The patient tolerated the procedure well without  complications.   IMPRESSION:  1. UES sphincter dilated with the CRE dilator, size 15/16 mm.  2. Mild gastritis.  3. Proximal small bowel biopsy to rule out sprue.   RECOMMENDATIONS:  1. A soft diet is recommended for the next couple of days.  2. Continue Prevacid Solutab.  3. Repeat dilatation is planned in eight weeks to achieve better      results.  4. Outpatient follow-up as need arises in the future.      Anselmo Rod, M.D.  Electronically Signed     JNM/MEDQ  D:  06/27/2006  T:  06/28/2006  Job:  [id number]   cc:   Jeannett Senior A. Clent Ridges, MD

## 2010-08-31 NOTE — Procedures (Signed)
. [facility]  Patient:    Mackenzie [name], Mackenzie [name]                      MRN: [id number] Proc. Date: 05/28/00 Adm. Date:  [id number] Attending:  Charna Elizabeth CC:         Reuben Likes, M.D.   Procedure Report  DATE OF BIRTH:  Aug 19, 1950.  REFERRING PHYSICIAN:  Reuben Likes, M.D.  PROCEDURE PERFORMED:  Esophagogastroduodenoscopy with Savary dilatation of esophageal stricture.  ENDOSCOPIST:  Anselmo Rod, M.D.  INSTRUMENT USED:  PREPROCEDURE PREPARATION:  Informed consent was procured from the patient. The patient was fasted for eight hours prior to the procedure.  PREPROCEDURE PHYSICAL:  The patient had stable vital signs.  Neck supple. Chest clear to auscultation.  S1, S2 regular.  Abdomen soft with normal abdominal bowel sounds.  DESCRIPTION OF PROCEDURE:  The patient was placed in left lateral decubitus position and sedated with 60 mg of Demerol and 7 mg of Versed intravenously. Once the patient was adequately sedated and maintained on low-flow oxygen and continuous cardiac monitoring, the Olympus video panendoscope was advanced through the mouthpiece, over the tongue, into the esophagus under direct vision.  The scope was advanced to the stomach.  A guide wire was then placed in the stomach and the upper esophageal stricture which was slightly difficult to negotiate while passing the scope notice at 10 cm, was dilated with serial Savary dilators using a 12 mm, 12.8 mm, 14 mm, 15 mm, 16 mm, 17 mm, 18 mm, 19 mm dilators.  There was slight heme seen on the 14 mm dilator but the patient tolerated the procedure well without complications.  IMPRESSION:  Upper esophageal sphincter stricture seen at 10 cm, dilated with Savary dilators.  RECOMMENDATION: 1. The patient has been advised to try Protonix 40 mg 1 p.o. q.d.  A    prescription has been given to her for 30 days with six refills. 2. Carafate slurry 1 gm q.i.d. has been prescribed  for the next 15 days. 3. Soft diet has been advocated for the next two to three days and she is    to advance her diet as tolerated. DD:  05/29/00 TD:  05/29/00 Job: [id number]

## 2010-08-31 NOTE — Op Note (Signed)
NAME:  Mackenzie [name], Mackenzie [name]                         ACCOUNT NO.:  [id number]   MEDICAL RECORD NO.:  [id number]                   PATIENT TYPE:  AMB   LOCATION:  ENDO                                 FACILITY:  [facility]   PHYSICIAN:  Anselmo Rod, M.D.               DATE OF BIRTH:  07/02/1950   DATE OF PROCEDURE:  03/23/2003  DATE OF DISCHARGE:                                 OPERATIVE REPORT   PROCEDURE PERFORMED:  Esophagogastroduodenoscopy with dilatation of upper  esophageal sphincter.   ENDOSCOPIST:  Anselmo Rod, M.D.   INSTRUMENT USED:  Olympus video panendoscope.   INDICATIONS FOR PROCEDURE:  This is a 60 year old white female with a  history of proximal esophagus stricture, undergoing dilatation.  The  patient's last dilatation was in May of 2003.   PREPROCEDURE PREPARATION:  Informed consent was secured from the patient.  The patient fasted for eight hours prior to the procedure.   PREPROCEDURE PHYSICAL:  VITAL SIGNS:  Stable.  NECK:  Supple.  CHEST:  Clear to auscultation.  ABDOMEN:  Soft, with normal bowel sounds.   DESCRIPTION OF THE PROCEDURE:  The patient was placed in the left lateral  decubitus position and sedated with 70 mg of Demerol and 10 mg of Versed  intravenously.  Once the patient was adequately sedated and maintained on  low-flow oxygen and continuous cardiac monitor, the Olympus panendoscope was  advanced through the mouthpiece, over the tongue, into the esophagus under  direct vision.  There was some hesitancy noted in passage of the scope  through the UES.  The rest of the esophagus was widely patent.  The GE  junction appeared normal.  There was colonic gastritis noted throughout the  gastric mucosa.  No ulcers, erosions, masses, or polyps were seen.  Retroflexion of the high cardia revealed no abnormalities except for the  chronic gastritis.  The proximal small bowel appeared normal.  A guidewire  was passed into the stomach in the routine manner,  and the proximal  esophageal stricture was dilated with 12.8- and 15-French dilators over the  guidewire in a routine manner.  There was a small amount of heme noted on  the dilators.  The patient tolerated the procedure well without  complication.  The proximal small bowel appeared normal.   IMPRESSION:  1. Proximal esophageal stricture dilated with three Savary dilators (see     description above).  2. Severe gastritis.  3. Normal proximal small bowel.   RECOMMENDATIONS:  1. Soft diet for the next two to three days.  2. Continue PPIs.  3. Avoid nonsteroidals.  4. Repeat EGD with possible dilatation in the next four to six weeks.  Anselmo Rod, M.D.    JNM/MEDQ  D:  03/23/2003  T:  03/23/2003  Job:  [id number]   cc:   Reuben Likes, M.D.  [address]  [place]  [place] [address]  Fax: [phone]

## 2010-08-31 NOTE — Op Note (Signed)
   NAME:  Mackenzie [name], Mackenzie [name]                         ACCOUNT NO.:  [id number]   MEDICAL RECORD NO.:  [id number]                   PATIENT TYPE:  AMB   LOCATION:  ENDO                                 FACILITY:  [facility]   PHYSICIAN:  Anselmo Rod, M.D.               DATE OF BIRTH:  Feb 26, 1951   DATE OF PROCEDURE:  09/24/2002  DATE OF DISCHARGE:                                 OPERATIVE REPORT   PROCEDURE PERFORMED:  Colonoscopy with snare polypectomy x1.   ENDOSCOPIST:  Anselmo Rod, M.D.   INSTRUMENT USED:  Olympus videocolonoscope.   INDICATION FOR THE PROCEDURE:  Screening colonoscopy being performed in a 27-  year-old white female.  Rule out colonic polyps, masses, etc.   PREPROCEDURE PREPARATION:  Informed consent was procured from the patient.  The patient fasted for eight hours prior to the procedure and prepped with a  bottle of magnesium citrate and a gallon on GoLYTELY the night prior to the  procedure.   PREPROCEDURE PHYSICAL:  VITAL SIGNS:  Stable.  NECK:  Supple.  CHEST:  Clear to auscultation, S1 and S2 regular.  ABDOMEN:  Soft with normal bowel sounds.   DESCRIPTION OF THE PROCEDURE:  The patient was placed in the left lateral  decubitus position and sedated with 100 mg of Demerol and  10 mg Versed  intravenously.  Once the patient was adequately sedated and maintained on  low-flow oxygen and continuous cardiac monitoring, the Olympus  videocolonoscope was advanced from the rectum to the cecum without  difficulty.  The patient has fairly good prep.  A small sessile polyp was  snared in the hepatic flexure.  Small internal and external hemorrhoids were  seen.  The patient tolerated the procedure well without complications.  The  appendiceal orifice and ileocecal valve were visualized and photographed.  The terminal ileum appeared normal.   IMPRESSION:  1. Small sessile polyp snared from the hepatic flexure.  2. Small nonbleeding internal and external  hemorrhoids.  3. Normal-appearing terminal ileum.  4. No masses or polyps seen in the cecum, right colon, left colon, rectum,     or rectosigmoid.    RECOMMENDATIONS:  1. Await pathology results.  2. Avoid nonsteroidals including aspirin for the next four weeks.  3. Outpatient followup in the next two weeks for further recommendations.                                               Anselmo Rod, M.D.    JNM/MEDQ  D:  09/24/2002  T:  09/25/2002  Job:  [id number]   cc:   Reuben Likes, M.D.  [address]  [place]  [place] [address]  Fax: [phone]

## 2010-08-31 NOTE — Procedures (Signed)
[place]  Patient:    Mackenzie [name], Mackenzie [name]                      MRN: [id number] Proc. Date: 06/20/99 Adm. Date:  [id number] Attending:  Charna Elizabeth CC:         Reuben Likes, M.D.                           Procedure Report  DATE OF BIRTH:  27-Oct-1950.  REFERRING PHYSICIAN:  Reuben Likes, M.D.  PROCEDURE PERFORMED:  Esophagogastroduodenoscopy with Savory dilatation of an esophageal stricture.  ENDOSCOPIST:  Anselmo Rod, M.D.  INSTRUMENT USED:  Olympus video panendoscope.  INDICATION FOR PROCEDURE:  [age]-year-old white female with a history of high esophageal stricture and recurrent dysphagia.  Repeat dilatation is planned.  PROCEDURE PREPARATION:  Informed consent was procured from the patient.  The patient was fasted for eight hours prior to the procedure and taken off of all nonsteroidals for a week prior to the procedure.  PREPROCEDURE PHYSICAL:  VITAL SIGNS:  Patient had stable vital signs.  NECK:  Supple.  CHEST:  Clear to auscultation.  S1 and S2 regular.  ABDOMEN:  Soft, with normal abdominal bowel sounds.  DESCRIPTION OF PROCEDURE:  The patient was placed in the left lateral decubitus  position and sedated with 60 mg of Demerol and 6 mg of Versed intravenously. Once the patient was adequately sedate and maintained on low-flow oxygen and continuous cardiac monitoring, the Olympus video panendoscope was advanced through the mouthpiece, over the tongue and into the esophagus under direct vision.  There as a high esophageal stricture seen at the level of the cricopharyngeus; this was dilated with Savory dilators measuring 12.0 mm, 12.8 mm, 14.0 mm, 15.0 mm and 16.0 mm.  There was minimal amount of heme on the last dilator.  The rest of the esophagus appeared normal.  There was debris in the stomach in the high fundus;  this was suctioned out for more clear visualization of the underlying  mucosa. o abnormalities were seen except for mild diffuse gastritis throughout the stomach. A small hiatal hernia was seen on retroflexion.  The proximal small bowel appeared normal.  The patient tolerated the procedure well without complication.  IMPRESSION: 1. High esophageal stricture, dilated with Savory dilators. 2. Small hiatal hernia. 3. Mild diffuse gastritis. 4. Normal proximal small bowel.  RECOMMENDATIONS: 1. Patient is to maintain a soft diet for one week. 2. She has been given a prescription for Carafate slurry 1 g q.8h. for the next two    weeks. 3. Samples of Prevacid 30 mg p.o. q.d. have been given to her from the office for    the next month. 4. She has strongly been advised to refrain from the use of all nonsteroidals. 5. Outpatient followup is advised in the next two weeks. DD:  06/20/99 TD:  06/21/99 Job: [id number]

## 2010-08-31 NOTE — Op Note (Signed)
Mackenzie [name], Mackenzie [name]               ACCOUNT NO.:  [id number]   MEDICAL RECORD NO.:  [id number]          PATIENT TYPE:  AMB   LOCATION:  ENDO                         FACILITY:  [facility]   PHYSICIAN:  Anselmo Rod, M.D.  DATE OF BIRTH:  1950/10/31   DATE OF PROCEDURE:  DATE OF DISCHARGE:  05/08/2007                               OPERATIVE REPORT   PROCEDURE PERFORMED:  Esophagogastroduodenoscopy with balloon dilatation  of the upper esophageal stricture.  Marland Kitchen   ENDOSCOPIST:  Anselmo Rod, M.D.   INSTRUMENT USED:  Pentax video panendoscope.   INDICATIONS FOR PROCEDURE:  A 60 year old white female with a history of  a tight stricture at the UES undergoing EGD with dilatation.  The  patient has had several dilatations in the past.  Preprocedure  preparation and informed consent was procured from the patient.  The  patient fasted for 8 hours prior to procedure.  The risks and benefits  were discussed with the patient in great detail.   PREPROCEDURE PHYSICAL:  VITAL SIGNS:  Stable.  NECK:  Supple.  CHEST:  Clear to auscultation.  HEART:  S1, S2, regular.  ABDOMEN:  Soft, with normal bowel sounds.   DESCRIPTION OF PROCEDURE:  The patient was placed in a left lateral  decubitus position, sedated with 75 mcg of Fentanyl and 6 mg of Versed  given intravenously in slow incremental doses.  Once the patient was  adequately sedated,  maintained on low-flow oxygen and continuous  cardiac monitoring, the Pentax video panendoscope was advanced through  the mouthpiece over the tongue into the esophagus.  There was hesitation  with passage of the scope through the upper esophageal sphincter.  The  scope was then gently maneuvered into the esophagus.  The rest of the  esophagus was widely patent with no evidence of  ring stricture, masses,  esophagitis or [name] mucosa.  The scope was then advanced into the  stomach.  After thorough inspection, the GE junction appeared normal.  Mild diffuse  gastritis was noted. A small hiatal hernia was seen on high  retroflexion.  The proximal small bowel up to 60 cm appeared normal.  Once the upper GI tract was adequately inspected, the 15 mm balloon  dilator was used to dilate the upper esophageal sphincter.  This was  increased to 16.5 mm.  The balloon was held in place for about 2  minutes, and then the balloon was dilated again to 18 mm, and the  balloon was held again in place for 2 minutes.  The patient tolerated  the procedure well without complication.  There was some small tear from  the passage of the scope into the esophagus on initial inspection.   IMPRESSION:  1. Upper esophageal sphincter stricture dilated with the controlled      radial expansion balloon at 15, 16.5 and 18 mm.  2. Small hiatal hernia.  3. Mild diffuse gastritis.  4. Normal proximal small bowel.   RECOMMENDATIONS:  1. An ENT evaluation is planned for the patient.  2. Continue Prevacid Solutab for now.  3. Soft diet for the next couple  of days.  4. Outpatient follow up as needs arise in the future.      Anselmo Rod, M.D.  Electronically Signed     JNM/MEDQ  D:  05/11/2007  T:  05/11/2007  Job:  [id number]   cc:   Jeannett Senior A. Clent Ridges, MD  [address] [place]  [place] [address]

## 2010-11-01 ENCOUNTER — Telehealth: Payer: Self-pay

## 2010-11-01 NOTE — Telephone Encounter (Signed)
Pt would like an Rx for prevacid solu tabs because she feels like they work better that the caps.  She last saw you for a CPE 03/20/10

## 2010-11-02 MED ORDER — LANSOPRAZOLE 30 MG PO TBDP: 30.0000 mg | ORAL_TABLET | Freq: Every day | ORAL | Status: DC

## 2010-11-02 NOTE — Telephone Encounter (Signed)
Done and pt aware

## 2010-11-02 NOTE — Telephone Encounter (Signed)
Call in Prevacid Solutabs 30mg  to take every day, #30 with 11 rf

## 2011-07-04 ENCOUNTER — Encounter: Payer: Self-pay | Admitting: Family Medicine

## 2011-07-05 ENCOUNTER — Ambulatory Visit: Payer: BC Managed Care – PPO | Admitting: Family Medicine

## 2011-11-04 ENCOUNTER — Other Ambulatory Visit: Payer: Self-pay | Admitting: Family Medicine

## 2011-11-05 ENCOUNTER — Other Ambulatory Visit: Payer: Self-pay | Admitting: Family Medicine

## 2011-11-05 NOTE — Telephone Encounter (Signed)
Can we refill this? 

## 2011-11-06 NOTE — Telephone Encounter (Signed)
Call in #30 only. Needs an OV  

## 2011-11-24 ENCOUNTER — Other Ambulatory Visit: Payer: Self-pay | Admitting: Family Medicine

## 2011-12-03 ENCOUNTER — Encounter: Payer: Self-pay | Admitting: Family Medicine

## 2011-12-03 ENCOUNTER — Ambulatory Visit (INDEPENDENT_AMBULATORY_CARE_PROVIDER_SITE_OTHER): Payer: BC Managed Care – PPO | Admitting: Family Medicine

## 2011-12-03 VITALS — BP 138/82 | HR 105 | Temp 98.5°F | Wt 110.0 lb

## 2011-12-03 DIAGNOSIS — M810 Age-related osteoporosis without current pathological fracture: Secondary | ICD-10-CM

## 2011-12-03 DIAGNOSIS — K219 Gastro-esophageal reflux disease without esophagitis: Secondary | ICD-10-CM

## 2011-12-03 MED ORDER — LANSOPRAZOLE 30 MG PO TBDP: 30.0000 mg | ORAL_TABLET | Freq: Every day | ORAL | Status: DC

## 2011-12-03 NOTE — Progress Notes (Signed)
  Subjective:    Patient ID: Mackenzie [name], female    DOB: Apr 13, 1951, 61 y.o.   MRN: [id number]  HPI Here for refills. She feels fine and her GERD is well controlled. She sees Dr. Henderson Cloud for GYN care , and he had been taking care of her osteoporosis. She had been getting Reclast treatments, but apparently her DEXA scan last year did not show much improvement. She was referred to Dr. Ardyth Harps, who switched her to [facility] shots daily. She has been on these for 2 months now.    Review of Systems  Constitutional: Negative.   Respiratory: Negative.   Cardiovascular: Negative.   Gastrointestinal: Negative.        Objective:   Physical Exam  Constitutional: She appears well-developed and well-nourished.  Neck: No thyromegaly present.  Cardiovascular: Normal rate, regular rhythm, normal heart sounds and intact distal pulses.   Pulmonary/Chest: Effort normal and breath sounds normal.  Abdominal: Soft. Bowel sounds are normal. She exhibits no distension and no mass. There is no tenderness. There is no rebound and no guarding.  Lymphadenopathy:    She has no cervical adenopathy.          Assessment & Plan:  Refilled her Prevacid. She will sign releases so we can get copies of her recent office notes and lab results.

## 2011-12-27 ENCOUNTER — Ambulatory Visit: Payer: BC Managed Care – PPO | Admitting: Family Medicine

## 2012-01-16 ENCOUNTER — Encounter: Payer: Self-pay | Admitting: Internal Medicine

## 2012-01-16 ENCOUNTER — Ambulatory Visit (INDEPENDENT_AMBULATORY_CARE_PROVIDER_SITE_OTHER): Payer: BC Managed Care – PPO | Admitting: Internal Medicine

## 2012-01-16 VITALS — BP 144/80 | HR 78 | Temp 98.3°F | Wt 108.0 lb

## 2012-01-16 DIAGNOSIS — L259 Unspecified contact dermatitis, unspecified cause: Secondary | ICD-10-CM

## 2012-01-16 DIAGNOSIS — L239 Allergic contact dermatitis, unspecified cause: Secondary | ICD-10-CM

## 2012-01-16 MED ORDER — CLOBETASOL PROPIONATE 0.05 % EX CREA: TOPICAL_CREAM | Freq: Two times a day (BID) | CUTANEOUS | Status: DC

## 2012-01-16 NOTE — Assessment & Plan Note (Signed)
61 year old white female with signs and symptoms of allergic dermatitis on left lower neck. Use clobetasol cream as directed twice daily for 2 weeks. If symptoms do not improve, patient advised to follow up with her PCP within 2 weeks.

## 2012-01-16 NOTE — Progress Notes (Signed)
  Subjective:    Patient ID: Mackenzie [name], female    DOB: [birth year], 61 y.o.   MRN: [id number]  HPI  61 year old white female complains of slightly red itchy area on left lower neck for one to 2 months. She has tried over-the-counter hydrocortisone creams without any improvement. She denies any associated fever or, joint pains or uveitis symptoms.  She denies any new environmental exposures including lotions, creams and/or new jewelry.  Review of Systems See HPI     Past Medical History  Diagnosis Date  . GERD (gastroesophageal reflux disease)     see's Dr. Loreta Ave  . Gynecological examination     see's Dr. Henderson Cloud   . Sebaceous cyst     on back of neck, has seen Dr. Bertram Savin  . Osteoporosis     sees Dr. Ardyth Harps     History   Social History  . Marital Status: Married    Spouse Name: N/A    Number of Children: N/A  . Years of Education: N/A   Occupational History  . Not on file.   Social History Main Topics  . Smoking status: Never Smoker   . Smokeless tobacco: Never Used  . Alcohol Use: No  . Drug Use: No  . Sexually Active: Not on file   Other Topics Concern  . Not on file   Social History Narrative  . No narrative on file    Past Surgical History  Procedure Date  . Pharyngeal dilatation 8/09    Dr. Christia Reading  . Esophagogastroduodenoscopy 05/20/07    No family history on file.  No Known Allergies  Current Outpatient Prescriptions on File Prior to Visit  Medication Sig Dispense Refill  . Calcium Carb-Cholecalciferol (CALCIUM + D3 PO) Take by mouth daily.      . lansoprazole (PREVACID SOLUTAB) 30 MG disintegrating tablet Take 1 tablet (30 mg total) by mouth daily.  30 tablet  11  . Teriparatide, Recombinant, (FORTEO [place]) Inject 20 mcg into the skin daily.        BP 144/80  Pulse 78  Temp 98.3 F (36.8 C) (Oral)  Wt 108 lb (48.988 kg)  SpO2 97%    Objective:   Physical Exam  Constitutional: She appears well-developed and  well-nourished.  Cardiovascular: Normal rate, regular rhythm and normal heart sounds.   Pulmonary/Chest: Effort normal and breath sounds normal. She has no wheezes.  Skin:       Quarter sized area of redness right lower neck          Assessment & Plan:

## 2012-01-16 NOTE — Patient Instructions (Addendum)
Please follow up with Dr. Clent Ridges if your rash has not resolved within 2 weeks

## 2012-04-23 ENCOUNTER — Telehealth: Payer: Self-pay | Admitting: Family Medicine

## 2012-04-23 NOTE — Telephone Encounter (Signed)
Pt received letter from [employer] staing they will no longer pay for lansoprazole (PREVACID SOLUTAB) 30 MG disintegrating tablet. They did give list. Pt says lansoprazole another form (pill) is on list and that is ok. Pt needs refill of this by 17th. Pharm: [facility]/ [place]

## 2012-04-24 MED ORDER — LANSOPRAZOLE 30 MG PO CPDR: 30.0000 mg | DELAYED_RELEASE_CAPSULE | Freq: Every day | ORAL | Status: DC

## 2012-04-24 NOTE — Telephone Encounter (Signed)
Change to regular Prevacid 30 mg a day. Call in one year supply

## 2012-04-24 NOTE — Telephone Encounter (Signed)
I sent script e-scribe and left voice message for pt 

## 2012-09-14 ENCOUNTER — Encounter: Payer: Self-pay | Admitting: Family Medicine

## 2012-09-14 ENCOUNTER — Ambulatory Visit (INDEPENDENT_AMBULATORY_CARE_PROVIDER_SITE_OTHER): Payer: BC Managed Care – PPO | Admitting: Family Medicine

## 2012-09-14 VITALS — BP 128/72 | HR 94 | Temp 98.3°F | Wt 110.0 lb

## 2012-09-14 DIAGNOSIS — D3110 Benign neoplasm of unspecified cornea: Secondary | ICD-10-CM

## 2012-09-14 DIAGNOSIS — D3112 Benign neoplasm of left cornea: Secondary | ICD-10-CM

## 2012-09-14 NOTE — Progress Notes (Signed)
  Subjective:    Patient ID: Mackenzie [name], female    DOB: February 06, 1951, 62 y.o.   MRN: [id number]  HPI Here for 3 days of mild pain in the left eye. No recent trauma. She woke up 3 mornings ago with this pain and it has persisted. She uses eye drops which help for awhile. No change in vision. She does not wear contacts.    Review of Systems  Constitutional: Negative.   Eyes: Positive for pain. Negative for photophobia, discharge, redness, itching and visual disturbance.       Objective:   Physical Exam  Constitutional: She appears well-developed and well-nourished. No distress.  Eyes:  The left cornea has an area of milky white discoloration that appears to be within the cornea and not on the surface          Assessment & Plan:  Corneal lesion, possible abrasion vs an acute infection. Refer to Ophthalmology ASAP

## 2012-12-22 ENCOUNTER — Ambulatory Visit: Payer: BC Managed Care – PPO | Admitting: Family Medicine

## 2013-01-11 ENCOUNTER — Encounter (HOSPITAL_BASED_OUTPATIENT_CLINIC_OR_DEPARTMENT_OTHER): Payer: Self-pay | Admitting: *Deleted

## 2013-01-12 ENCOUNTER — Encounter (HOSPITAL_BASED_OUTPATIENT_CLINIC_OR_DEPARTMENT_OTHER): Payer: Self-pay | Admitting: *Deleted

## 2013-01-12 NOTE — Progress Notes (Signed)
No labs needed

## 2013-01-18 ENCOUNTER — Encounter (HOSPITAL_BASED_OUTPATIENT_CLINIC_OR_DEPARTMENT_OTHER): Payer: Self-pay | Admitting: Anesthesiology

## 2013-01-18 ENCOUNTER — Ambulatory Visit (HOSPITAL_BASED_OUTPATIENT_CLINIC_OR_DEPARTMENT_OTHER)
Admission: RE | Admit: 2013-01-18 | Discharge: 2013-01-18 | Disposition: A | Discharge status: Home / Self Care | Payer: BC Managed Care – PPO | Source: Ambulatory Visit | Attending: Otolaryngology | Admitting: Otolaryngology

## 2013-01-18 ENCOUNTER — Ambulatory Visit (HOSPITAL_BASED_OUTPATIENT_CLINIC_OR_DEPARTMENT_OTHER): Payer: BC Managed Care – PPO | Admitting: Anesthesiology

## 2013-01-18 ENCOUNTER — Encounter (HOSPITAL_BASED_OUTPATIENT_CLINIC_OR_DEPARTMENT_OTHER)
Admission: RE | Disposition: A | Discharge status: Home / Self Care | Payer: Self-pay | Source: Ambulatory Visit | Attending: Otolaryngology

## 2013-01-18 DIAGNOSIS — K219 Gastro-esophageal reflux disease without esophagitis: Secondary | ICD-10-CM | POA: Insufficient documentation

## 2013-01-18 DIAGNOSIS — K222 Esophageal obstruction: Secondary | ICD-10-CM | POA: Insufficient documentation

## 2013-01-18 HISTORY — DX: Presence of dental prosthetic device (complete) (partial): Z97.2

## 2013-01-18 HISTORY — DX: Complete loss of teeth, unspecified cause, unspecified class: K08.109

## 2013-01-18 HISTORY — PX: RIGID ESOPHAGOSCOPY: SHX5226

## 2013-01-18 LAB — POCT HEMOGLOBIN-HEMACUE: Hemoglobin: 5.9 g/dL — CL (ref 12.0–15.0)

## 2013-01-18 SURGERY — ESOPHAGOSCOPY, RIGID
Anesthesia: General | Site: Esophagus | Wound class: Clean Contaminated

## 2013-01-18 MED ORDER — OXYCODONE HCL 5 MG PO TABS: 5.0000 mg | ORAL_TABLET | Freq: Once | ORAL | Status: DC | PRN

## 2013-01-18 MED ORDER — LIDOCAINE HCL (CARDIAC) 20 MG/ML IV SOLN
INTRAVENOUS | Status: DC | PRN
  Administered 2013-01-18: 50 mg via INTRAVENOUS

## 2013-01-18 MED ORDER — OXYCODONE HCL 5 MG/5ML PO SOLN: 5.0000 mg | Freq: Once | ORAL | Status: DC | PRN

## 2013-01-18 MED ORDER — SUCCINYLCHOLINE CHLORIDE 20 MG/ML IJ SOLN
INTRAMUSCULAR | Status: DC | PRN
  Administered 2013-01-18: 100 mg via INTRAVENOUS

## 2013-01-18 MED ORDER — MIDAZOLAM HCL 5 MG/5ML IJ SOLN
INTRAMUSCULAR | Status: DC | PRN
  Administered 2013-01-18: 1 mg via INTRAVENOUS

## 2013-01-18 MED ORDER — PROMETHAZINE HCL 25 MG/ML IJ SOLN: 6.2500 mg | INTRAMUSCULAR | Status: DC | PRN

## 2013-01-18 MED ORDER — FENTANYL CITRATE 0.05 MG/ML IJ SOLN
INTRAMUSCULAR | Status: DC | PRN
  Administered 2013-01-18: 50 ug via INTRAVENOUS

## 2013-01-18 MED ORDER — DEXAMETHASONE SODIUM PHOSPHATE 4 MG/ML IJ SOLN
INTRAMUSCULAR | Status: DC | PRN
  Administered 2013-01-18: 5 mg via INTRAVENOUS

## 2013-01-18 MED ORDER — ONDANSETRON HCL 4 MG/2ML IJ SOLN
INTRAMUSCULAR | Status: DC | PRN
  Administered 2013-01-18: 4 mg via INTRAMUSCULAR

## 2013-01-18 MED ORDER — HYDROMORPHONE HCL PF 1 MG/ML IJ SOLN: 0.2500 mg | INTRAMUSCULAR | Status: DC | PRN

## 2013-01-18 MED ORDER — LACTATED RINGERS IV SOLN
INTRAVENOUS | Status: DC
  Administered 2013-01-18: 10:00:00 via INTRAVENOUS

## 2013-01-18 MED ORDER — PROPOFOL 10 MG/ML IV BOLUS
INTRAVENOUS | Status: DC | PRN
  Administered 2013-01-18: 150 mg via INTRAVENOUS

## 2013-01-18 SURGICAL SUPPLY — 27 items
CANISTER SUCTION 1200CC (MISCELLANEOUS) ×2 IMPLANT
CLOTH BEACON ORANGE TIMEOUT ST (SAFETY) ×2 IMPLANT
CONT SPEC 4OZ CLIKSEAL STRL BL (MISCELLANEOUS) ×2 IMPLANT
GAUZE SPONGE 4X4 12PLY STRL LF (GAUZE/BANDAGES/DRESSINGS) ×2 IMPLANT
GLOVE BIO SURGEON STRL SZ7.5 (GLOVE) ×2 IMPLANT
GLOVE SURG SS PI 7.0 STRL IVOR (GLOVE) ×1 IMPLANT
GOWN PREVENTION PLUS XLARGE (GOWN DISPOSABLE) ×3 IMPLANT
GUARD TEETH (MISCELLANEOUS) ×2 IMPLANT
IV NS 500ML (IV SOLUTION)
IV NS 500ML BAXH (IV SOLUTION) ×1 IMPLANT
M00558370 FIXED WIRE ESOPHAGEAL BALLOON DILATATION CATHETER ×1 IMPLANT
MARKER SKIN DUAL TIP RULER LAB (MISCELLANEOUS) ×1 IMPLANT
NDL HYPO 18GX1.5 BLUNT FILL (NEEDLE) ×1 IMPLANT
NDL SPNL 22GX7 QUINCKE BK (NEEDLE) IMPLANT
NEEDLE HYPO 18GX1.5 BLUNT FILL (NEEDLE) ×2 IMPLANT
NEEDLE SPNL 22GX7 QUINCKE BK (NEEDLE) IMPLANT
NS IRRIG 1000ML POUR BTL (IV SOLUTION) ×2 IMPLANT
PATTIES SURGICAL .5 X3 (DISPOSABLE) ×1 IMPLANT
SHEET MEDIUM DRAPE 40X70 STRL (DRAPES) ×2 IMPLANT
SOLUTION ANTI FOG 6CC (MISCELLANEOUS) ×1 IMPLANT
SOLUTION BUTLER CLEAR DIP (MISCELLANEOUS) ×1 IMPLANT
SURGILUBE 2OZ TUBE FLIPTOP (MISCELLANEOUS) ×2 IMPLANT
SYR 5ML LL (SYRINGE) ×1 IMPLANT
SYR CONTROL 10ML LL (SYRINGE) ×1 IMPLANT
SYR INFLATE BILIARY GAUGE (MISCELLANEOUS) ×1 IMPLANT
TOWEL OR 17X24 6PK STRL BLUE (TOWEL DISPOSABLE) ×2 IMPLANT
TUBE CONNECTING 20X1/4 (TUBING) ×2 IMPLANT

## 2013-01-18 NOTE — Anesthesia Postprocedure Evaluation (Signed)
Anesthesia Post Note  Patient: Mackenzie [name]  Procedure(s) Performed: Procedure(s) (LRB): RIGID ESOPHAGOSCOPY WITH ESPHAGEAL Balloon DILATION  (N/A)  Anesthesia type: general  Patient location: PACU  Post pain: Pain level controlled  Post assessment: Patient's Cardiovascular Status Stable  Last Vitals:  Filed Vitals:   01/18/13 1140  BP:   Pulse: 72  Temp:   Resp: 17    Post vital signs: Reviewed and stable  Level of consciousness: sedated  Complications: No apparent anesthesia complications

## 2013-01-18 NOTE — H&P (Signed)
Mackenzie [name] is an 62 y.o. female.   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  She has had recurrence of symptoms for the past few weeks and presents for repeat dilation.  Past Medical History  Diagnosis Date  . GERD (gastroesophageal reflux disease)     see's Dr. Loreta Ave  . Gynecological examination     see's Dr. Henderson Cloud   . Sebaceous cyst     on back of neck, has seen Dr. Bertram Savin  . Osteoporosis     sees Dr. Ardyth Harps   . Full dentures     Past Surgical History  Procedure Laterality Date  . Esophagogastroduodenoscopy  06/01/2003    12/13  . Colonoscopy  09/24/2002  . Esophagogastroduodenoscopy (egd) with esophageal dilation      History reviewed. No pertinent family history. Social History:  reports that she has never smoked. She has never used smokeless tobacco. She reports that she does not drink alcohol or use illicit drugs.  Allergies: No Known Allergies  Medications Prior to Admission  Medication Sig Dispense Refill  . aspirin 81 MG tablet Take 81 mg by mouth daily.      . Calcium Carb-Cholecalciferol (CALCIUM + D3 PO) Take by mouth daily.      . lansoprazole (PREVACID) 30 MG capsule Take 1 capsule (30 mg total) by mouth daily.  30 capsule  11  . Teriparatide, Recombinant, (FORTEO Junior) Inject 20 mcg into the skin daily.        No results found for this or any previous visit (from the past 48 hour(s)). No results found.  Review of Systems  All other systems reviewed and are negative.    Blood pressure 167/76, pulse 65, temperature 98.3 F (36.8 C), temperature source Oral, resp. rate 18, height 5' (1.524 m), weight 49.896 kg (110 lb), SpO2 98.00%. Physical Exam  Constitutional: She is oriented to person, place, and time. She appears well-developed and well-nourished. No distress.  HENT:  Head: Normocephalic.  Right Ear:  External ear normal.  Left Ear: External ear normal.  Nose: Nose normal.  Mouth/Throat: Oropharynx is clear and moist.  Eyes: Conjunctivae and EOM are normal. Pupils are equal, round, and reactive to light.  Neck: Normal range of motion. Neck supple.  Cardiovascular: Normal rate.   Respiratory: Effort normal.  GI:  Did not examine.  Genitourinary:  Did not examine.  Musculoskeletal: Normal range of motion.  Neurological: She is alert and oriented to person, place, and time. No cranial nerve deficit.  Skin: Skin is warm and dry.  Psychiatric: She has a normal mood and affect. Her behavior is normal. Judgment and thought content normal.     Assessment/Plan Esophageal stenosis, dysphagia To OR for esophagoscopy with dilation.  Jafeth Mustin 01/18/2013, 10:00 AM

## 2013-01-18 NOTE — Anesthesia Procedure Notes (Signed)
Procedure Name: Intubation Date/Time: 01/18/2013 10:29 AM Performed by: Zenia Resides D Pre-anesthesia Checklist: Patient identified, Emergency Drugs available, Suction available and Patient being monitored Patient Re-evaluated:Patient Re-evaluated prior to inductionOxygen Delivery Method: Circle System Utilized Preoxygenation: Pre-oxygenation with 100% oxygen Intubation Type: IV induction Ventilation: Mask ventilation without difficulty Laryngoscope Size: Mac and 3 Grade View: Grade I Tube type: Oral Tube size: 5.5 mm Number of attempts: 1 Airway Equipment and Method: stylet and oral airway Placement Confirmation: ETT inserted through vocal cords under direct vision,  positive ETCO2 and breath sounds checked- equal and bilateral Tube secured with: Tape Dental Injury: Teeth and Oropharynx as per pre-operative assessment

## 2013-01-18 NOTE — Anesthesia Preprocedure Evaluation (Addendum)
Anesthesia Evaluation  Patient identified by MRN, date of birth, ID band Patient awake    Reviewed: Allergy & Precautions, H&P , NPO status , Patient's Chart, lab work & pertinent test results  History of Anesthesia Complications Negative for: history of anesthetic complications  Airway Mallampati: I TM Distance: >3 FB Neck ROM: Full    Dental  (+) Edentulous Upper, Edentulous Lower and Dental Advisory Given   Pulmonary neg pulmonary ROS,    Pulmonary exam normal       Cardiovascular negative cardio ROS      Neuro/Psych negative neurological ROS  negative psych ROS   GI/Hepatic Neg liver ROS, GERD-  ,  Endo/Other  negative endocrine ROS  Renal/GU negative Renal ROS     Musculoskeletal   Abdominal   Peds  Hematology negative hematology ROS (+)   Anesthesia Other Findings   Reproductive/Obstetrics                          Anesthesia Physical Anesthesia Plan  ASA: II  Anesthesia Plan: General   Post-op Pain Management:    Induction: Intravenous  Airway Management Planned: Oral ETT  Additional Equipment:   Intra-op Plan:   Post-operative Plan: Extubation in OR  Informed Consent: I have reviewed the patients History and Physical, chart, labs and discussed the procedure including the risks, benefits and alternatives for the proposed anesthesia with the patient or authorized representative who has indicated his/her understanding and acceptance.   Dental advisory given  Plan Discussed with: CRNA, Anesthesiologist and Surgeon  Anesthesia Plan Comments:        Anesthesia Quick Evaluation

## 2013-01-18 NOTE — Transfer of Care (Signed)
Immediate Anesthesia Transfer of Care Note  Patient: Mackenzie [name]  Procedure(s) Performed: Procedure(s): RIGID ESOPHAGOSCOPY WITH ESPHAGEAL Balloon DILATION  (N/A)  Patient Location: PACU  Anesthesia Type:General  Level of Consciousness: awake, alert  and oriented  Airway & Oxygen Therapy: Patient Spontanous Breathing and Patient connected to face mask oxygen  Post-op Assessment: Report given to PACU RN and Post -op Vital signs reviewed and stable  Post vital signs: Reviewed and stable  Complications: No apparent anesthesia complications

## 2013-01-18 NOTE — Brief Op Note (Signed)
01/18/2013  10:46 AM  PATIENT:  Mackenzie [name]  62 y.o. female  PRE-OPERATIVE DIAGNOSIS:  ESOPHAGEAL STENOSIS, DYSPHAGIA  POST-OPERATIVE DIAGNOSIS:  * No post-op diagnosis entered *  PROCEDURE:  Procedure(s): RIGID ESOPHAGOSCOPY WITH ESPHAGEAL Balloon DILATION  (N/A)  SURGEON:  Surgeon(s) and Role:    * Christia Reading, MD - Primary  PHYSICIAN ASSISTANT:   ASSISTANTS: none   ANESTHESIA:   general  EBL:     BLOOD ADMINISTERED:none  DRAINS: none and Nasogastric Tube   LOCAL MEDICATIONS USED:  NONE  SPECIMEN:  No Specimen  DISPOSITION OF SPECIMEN:  N/A  COUNTS:  YES  TOURNIQUET:  * No tourniquets in log *  DICTATION: .Other Dictation: Dictation Number [id number]  PLAN OF CARE: Discharge to home after PACU  PATIENT DISPOSITION:  PACU - hemodynamically stable.   Delay start of Pharmacological VTE agent (>24hrs) due to surgical blood loss or risk of bleeding: no

## 2013-01-19 NOTE — Op Note (Signed)
NAMEAYDIN, CAVALIERI NO.:  [id number]  MEDICAL RECORD NO.:  [id number]  LOCATION:                               FACILITY:  [facility]  PHYSICIAN:  Antony Contras, MD     DATE OF BIRTH:  June 01, 1950  DATE OF PROCEDURE:  01/18/2013 DATE OF DISCHARGE:  01/18/2013                              OPERATIVE REPORT   PREOPERATIVE DIAGNOSES: 1. Proximal esophageal stenosis. 2. Dysphagia.  POSTOPERATIVE DIAGNOSES: 1. Proximal esophageal stenosis. 2. Dysphagia.  PROCEDURE:  Rigid esophagoscopy with esophageal balloon dilation to 18 mm.  SURGEON:  Antony Contras, MD  ANESTHESIA:  General endotracheal anesthesia.  COMPLICATIONS:  None.  INDICATION:  The patient is a 62 year old female who has a long history of esophageal stenosis in the proximal esophagus that has required serial dilation with her last dilation occurring in December of last year.  She has had recurrence of obstructive symptoms over the last few weeks and presents for dilation.  FINDINGS:  The usual position of stenosis was again seen with approximately 80-90% stenosis.  Dilation was performed with the esophageal balloon dilator up to 18 mm with only a mucosal tear on the left side seen afterwards.  DESCRIPTION OF PROCEDURE:  The patient was identified in the holding room and informed consent having been obtained including discussion of risks, benefits, and alternatives, the patient was brought to the operative suite and put on the operative table in supine position. Anesthesia was induced and the patient was intubated by the anesthesia team without difficulty.  The eyes were taped closed and the bed was turned 90 degrees from anesthesia.  A damp gauze was placed over the upper gum and a cervical esophagoscope was inserted into the upper esophagus keeping the lumen in view.  The stenosis was approached and easily seen.  The esophageal balloon dilator was then passed through the rigid esophagoscope and  through the stenotic area, and first inflated to 15 mm for 1 minute and then removed.  The area was examined and only a small mucosal tear seen.  Thus, the dilator was again positioned and inflated to 18 mm for another 1 minute.  It was then removed and the mucosal tear was seemed to be widened, but only for the mucosa.  At this point, the cervical esophagoscope could passed through the area of stenosis without difficulty.  The esophagus was suctioned and the scope was removed as was the damp gauze. She was turned back to Anesthesia for wake up and was extubated, moved to the recovery room in stable condition.     Antony Contras, MD     DDB/MEDQ  D:  01/18/2013  T:  01/19/2013  Job:  [id number]

## 2013-01-20 ENCOUNTER — Encounter (HOSPITAL_BASED_OUTPATIENT_CLINIC_OR_DEPARTMENT_OTHER): Payer: Self-pay | Admitting: Otolaryngology

## 2013-04-23 ENCOUNTER — Other Ambulatory Visit: Payer: Self-pay | Admitting: Family Medicine

## 2013-08-17 ENCOUNTER — Other Ambulatory Visit: Payer: Self-pay | Admitting: Family Medicine

## 2013-10-20 ENCOUNTER — Ambulatory Visit: Payer: BC Managed Care – PPO | Admitting: Family Medicine

## 2013-11-01 ENCOUNTER — Telehealth: Payer: Self-pay | Admitting: Family Medicine

## 2013-11-02 ENCOUNTER — Ambulatory Visit: Payer: BC Managed Care – PPO | Admitting: Family Medicine

## 2013-11-08 ENCOUNTER — Encounter: Payer: Self-pay | Admitting: Family Medicine

## 2013-11-08 ENCOUNTER — Ambulatory Visit (INDEPENDENT_AMBULATORY_CARE_PROVIDER_SITE_OTHER): Payer: BC Managed Care – PPO | Admitting: Family Medicine

## 2013-11-08 VITALS — BP 155/92 | HR 71 | Temp 98.9°F | Ht 60.0 in | Wt 106.0 lb

## 2013-11-08 DIAGNOSIS — K219 Gastro-esophageal reflux disease without esophagitis: Secondary | ICD-10-CM

## 2013-11-08 DIAGNOSIS — I10 Essential (primary) hypertension: Secondary | ICD-10-CM

## 2013-11-08 LAB — CBC WITH DIFFERENTIAL/PLATELET
BASOS PCT: 0.4 % (ref 0.0–3.0)
Basophils Absolute: 0 10*3/uL (ref 0.0–0.1)
EOS ABS: 0.1 10*3/uL (ref 0.0–0.7)
EOS PCT: 1.9 % (ref 0.0–5.0)
HCT: 41.4 % (ref 36.0–46.0)
Hemoglobin: 13.8 g/dL (ref 12.0–15.0)
Lymphocytes Relative: 29.9 % (ref 12.0–46.0)
Lymphs Abs: 2.2 10*3/uL (ref 0.7–4.0)
MCHC: 33.3 g/dL (ref 30.0–36.0)
MCV: 89.2 fl (ref 78.0–100.0)
MONOS PCT: 8.2 % (ref 3.0–12.0)
Monocytes Absolute: 0.6 10*3/uL (ref 0.1–1.0)
NEUTROS PCT: 59.6 % (ref 43.0–77.0)
Neutro Abs: 4.3 10*3/uL (ref 1.4–7.7)
Platelets: 318 10*3/uL (ref 150.0–400.0)
RBC: 4.65 Mil/uL (ref 3.87–5.11)
RDW: 13.1 % (ref 11.5–15.5)
WBC: 7.2 10*3/uL (ref 4.0–10.5)

## 2013-11-08 LAB — POCT URINALYSIS DIPSTICK
Bilirubin, UA: NEGATIVE
Glucose, UA: NEGATIVE
KETONES UA: NEGATIVE
Leukocytes, UA: NEGATIVE
Nitrite, UA: NEGATIVE
PH UA: 5.5
PROTEIN UA: NEGATIVE
RBC UA: NEGATIVE
SPEC GRAV UA: 1.02
UROBILINOGEN UA: 0.2

## 2013-11-08 LAB — HEPATIC FUNCTION PANEL
ALBUMIN: 3.8 g/dL (ref 3.5–5.2)
ALK PHOS: 31 U/L — AB (ref 39–117)
ALT: 12 U/L (ref 0–35)
AST: 18 U/L (ref 0–37)
BILIRUBIN DIRECT: 0 mg/dL (ref 0.0–0.3)
TOTAL PROTEIN: 7.6 g/dL (ref 6.0–8.3)
Total Bilirubin: 0.4 mg/dL (ref 0.2–1.2)

## 2013-11-08 LAB — BASIC METABOLIC PANEL
BUN: 14 mg/dL (ref 6–23)
CHLORIDE: 104 meq/L (ref 96–112)
CO2: 28 meq/L (ref 19–32)
Calcium: 9.7 mg/dL (ref 8.4–10.5)
Creatinine, Ser: 0.7 mg/dL (ref 0.4–1.2)
GFR: 89.46 mL/min (ref >60.00)
GLUCOSE: 98 mg/dL (ref 70–99)
POTASSIUM: 4.6 meq/L (ref 3.5–5.1)
SODIUM: 138 meq/L (ref 135–145)

## 2013-11-08 LAB — TSH: TSH: 0.61 u[IU]/mL (ref 0.35–4.50)

## 2013-11-08 MED ORDER — RAMIPRIL 10 MG PO CAPS: 10.0000 mg | ORAL_CAPSULE | Freq: Every day | ORAL | Status: DC

## 2013-11-08 MED ORDER — LANSOPRAZOLE 30 MG PO CPDR: DELAYED_RELEASE_CAPSULE | ORAL | Status: DC

## 2013-11-08 NOTE — Progress Notes (Signed)
   Subjective:    Patient ID: Mackenzie [name], female    DOB: 1950/06/25, 63 y.o.   MRN: [id number]  HPI Here for refills and to discuss her BP. Her GERD has been well controlled. She notes that her BP at home has been creeping up this past year and now it is often in the 239R or 320E systolic. She feels well but occasionally has headaches.    Review of Systems  Constitutional: Negative.   Respiratory: Negative.   Cardiovascular: Negative.   Neurological: Positive for headaches.       Objective:   Physical Exam  Constitutional: She is oriented to person, place, and time. She appears well-developed and well-nourished.  Neck: No thyromegaly present.  Cardiovascular: Normal rate, regular rhythm, normal heart sounds and intact distal pulses.   Pulmonary/Chest: Effort normal and breath sounds normal.  Lymphadenopathy:    She has no cervical adenopathy.  Neurological: She is alert and oriented to person, place, and time.          Assessment & Plan:  Her GERD is stable. She will treat the HTN with Ramipril 10 mg daily. Get labs today and recheck in one month.

## 2013-11-08 NOTE — Progress Notes (Signed)
Pre visit review using our clinic review tool, if applicable. No additional management support is needed unless otherwise documented below in the visit note. 

## 2013-11-11 ENCOUNTER — Telehealth: Payer: Self-pay | Admitting: Family Medicine

## 2013-11-11 NOTE — Telephone Encounter (Signed)
I spoke with pt and put a copy of labs in mail.

## 2013-11-11 NOTE — Telephone Encounter (Signed)
Pt req a copy of her labs be sent to her home address. She would also like to know what her bp was on her last visit

## 2013-11-18 ENCOUNTER — Telehealth: Payer: Self-pay | Admitting: Family Medicine

## 2013-11-18 NOTE — Telephone Encounter (Signed)
Pt called to say the bp medicine  ramipril (ALTACE) 10 MG capsule makes her cough . Pt said she will no longer takes this medicine is there something else she can try.

## 2013-11-19 ENCOUNTER — Telehealth: Payer: Self-pay | Admitting: Family Medicine

## 2013-11-19 MED ORDER — AMLODIPINE BESYLATE 5 MG PO TABS: 5.0000 mg | ORAL_TABLET | Freq: Every day | ORAL | Status: DC

## 2013-11-19 NOTE — Telephone Encounter (Signed)
Per Dr. Sarajane Jews, pt can schedule to see him next week. If she does get any worse, then she can go to the ER. I spoke with pt and she will schedule this.

## 2013-11-19 NOTE — Telephone Encounter (Signed)
Stop the Ramipril. Call in Amlodipine 5 mg daily, one year supply

## 2013-11-19 NOTE — Telephone Encounter (Signed)
Patient Information:  Caller Name: Kaleeyah  Phone: [phone]  Patient: Mackenzie [name], Mackenzie [name]  Gender: Female  DOB: 09/08/1950  Age: 63 Years  PCP: [name] [facility])  Office Follow Up:  Does the office need to follow up with this patient?: Yes  Instructions For The Office: RN spoke [place], in office, and informed of above. Irine Seal requests that office note be sent for review by Dr. Sarajane Jews. States to advise Patient that she will receive a return call regarding above.  RN Note:  Patient states she has had frequent episodes of "palpitations" in the mornings. States sx have been occurring " for a few months." Patient states she experiences increased fatigue, dizziness, nausea accompanying the palpitations. Patient states she experienced palpitations at 0640 11/19/13. States episode lasted until approx. 1100 11/19/13. Patient states she continues to have intermittent palpitations. Patient denies dizziness, diaphoresis, dyspnea, nausea at present. Patient denies chest pain. Blood pressure 173/83, pulse 115 at 1545 11/19/13.  Care advice given per guidelines. Call back parameters reviewed. Patient advised not to drive. Patient advised to change positions slowly, with assistance, Patient verbalizes understanding. RN spoke [place], in office, and informed of above. Irine Seal requests that office note be sent for review by Dr. Sarajane Jews. States to advise Patient that she will receive a return call regarding above. Patient informed of above.  Patient advised to call 911 if chest pain develops or sx increase. Call back parameters reviewed. Patient verbalizes understanding. Patient requests call back at [phone].   Symptoms  Reason For Call & Symptoms: Increased fatigue, Palpitations  Reviewed Health History In EMR: Yes  Reviewed Medications In EMR: Yes  Reviewed Allergies In EMR: Yes  Reviewed Surgeries / Procedures: Yes  Date of Onset of Symptoms: Unknown  Guideline(s) Used:  Heart Rate  and Heartbeat Questions  Disposition Per Guideline:   See Today in Office  Reason For Disposition Reached:   Age > 60 years  Advice Given:  Avoid Caffeine  Avoid caffeine-containing beverages (Reason: caffeine is a stimulant and can aggravate palpitations).  Examples of caffeine-containing beverages include coffee, tea, colas, Mountain Dew, [employer], and some energy drinks.  Call Back If:  Chest pain, lightheadedness, or difficulty breathing occurs  Heart beating more than 130 beats / minute  More than 3 extra or skipped beats / minute  You become worse.  Patient Will Follow Care Advice:  YES

## 2013-11-19 NOTE — Telephone Encounter (Signed)
I spoke with pt, sent new script e-scribe and added Ramipril to drug allergy list.

## 2013-11-24 ENCOUNTER — Ambulatory Visit: Payer: BC Managed Care – PPO | Admitting: Family Medicine

## 2013-11-24 ENCOUNTER — Telehealth: Payer: Self-pay | Admitting: Family Medicine

## 2013-11-24 MED ORDER — BENZONATATE 200 MG PO CAPS: 200.0000 mg | ORAL_CAPSULE | Freq: Two times a day (BID) | ORAL | Status: DC | PRN

## 2013-11-24 NOTE — Telephone Encounter (Signed)
Pt called to ask if Dr Sarajane Jews would rx her something for a cough. She said she has tried over the counter and nothing is working

## 2013-11-24 NOTE — Telephone Encounter (Signed)
error 

## 2013-11-24 NOTE — Telephone Encounter (Signed)
I called the pt and advised her the Rx was called to her pharmacy.

## 2013-11-24 NOTE — Telephone Encounter (Signed)
Call in Benzonatate 200 mg to take bid prn cough, #60 with 2 rf

## 2013-11-24 NOTE — Telephone Encounter (Signed)
Duplicate

## 2013-12-02 ENCOUNTER — Encounter: Payer: Self-pay | Admitting: Family Medicine

## 2013-12-02 ENCOUNTER — Ambulatory Visit (INDEPENDENT_AMBULATORY_CARE_PROVIDER_SITE_OTHER): Payer: BC Managed Care – PPO | Admitting: Family Medicine

## 2013-12-02 VITALS — BP 142/84 | HR 72 | Temp 98.4°F | Wt 107.0 lb

## 2013-12-02 DIAGNOSIS — R030 Elevated blood-pressure reading, without diagnosis of hypertension: Secondary | ICD-10-CM

## 2013-12-02 DIAGNOSIS — L989 Disorder of the skin and subcutaneous tissue, unspecified: Secondary | ICD-10-CM

## 2013-12-02 DIAGNOSIS — IMO0001 Reserved for inherently not codable concepts without codable children: Secondary | ICD-10-CM | POA: Insufficient documentation

## 2013-12-02 NOTE — Progress Notes (Signed)
Pre visit review using our clinic review tool, if applicable. No additional management support is needed unless otherwise documented below in the visit note. 

## 2013-12-02 NOTE — Progress Notes (Signed)
   Subjective:    Patient ID: Mackenzie [name], female    DOB: Apr 09, 1951, 63 y.o.   MRN: [id number]  HPI Here to follow up elevated BP and to check several skin lesions. At our last visit she had been getting some BP readings at home with systolics in the 505L and we started her on ramipril. After taking a single dose she started to cough, and she stopped the med thinking this was a side effect. We then prescribed Amlodipine but she never took it because her BP came down on its own. Since then she has had readings of 110s or 120s over 70s. She actually developed what sounds like a viral URI because she had sinus drainage and a dry cough for about a week, but this resolved. She now feels fine. Also she asks me to check a lesion on her finger that appeared 2 years ago and has been slowly growing since then. And she has had a small lump on the right neck for 6 months.    Review of Systems  Constitutional: Negative.   Respiratory: Negative.   Cardiovascular: Negative.        Objective:   Physical Exam  Constitutional: She appears well-developed and well-nourished.  Cardiovascular: Normal rate, regular rhythm, normal heart sounds and intact distal pulses.   Pulmonary/Chest: Effort normal and breath sounds normal.  Skin:  There is a firm non-tender papule on the right neck. There is a large umbilicated non-tender lesion on the right 3rd finger.          Assessment & Plan:  She seems to have had a viral URI which resolved. Her BP at home was probably elevated as she was fighting the virus, but this has returned to her baseline now. She does NOT have HTN and she does not need top take BP meds. We will [facility] the skin lesions.

## 2014-09-22 ENCOUNTER — Telehealth: Payer: Self-pay

## 2014-09-22 NOTE — Telephone Encounter (Signed)
Pt to have done at obgyn office.

## 2014-11-03 ENCOUNTER — Other Ambulatory Visit: Payer: Self-pay | Admitting: Family Medicine

## 2015-01-09 ENCOUNTER — Encounter: Payer: Self-pay | Admitting: Family Medicine

## 2015-01-09 ENCOUNTER — Ambulatory Visit (INDEPENDENT_AMBULATORY_CARE_PROVIDER_SITE_OTHER): Payer: Self-pay | Admitting: Family Medicine

## 2015-01-09 ENCOUNTER — Telehealth: Payer: Self-pay | Admitting: Family Medicine

## 2015-01-09 VITALS — BP 114/97 | HR 131 | Temp 98.5°F | Ht 60.0 in | Wt 105.0 lb

## 2015-01-09 DIAGNOSIS — J019 Acute sinusitis, unspecified: Secondary | ICD-10-CM | POA: Diagnosis not present

## 2015-01-09 MED ORDER — AZITHROMYCIN 200 MG/5ML PO SUSR: ORAL | Status: DC

## 2015-01-09 NOTE — Telephone Encounter (Signed)
Per Dr. Sarajane Jews change amount of Zithromax liquid from 22.5 ml to 45 ml, this a correct amount. I spoke with pharmacy and gave correct amount.

## 2015-01-09 NOTE — Progress Notes (Signed)
   Subjective:    Patient ID: Mackenzie [name], female    DOB: 08-Jun-1950, 64 y.o.   MRN: [id number]  HPI Here for 3 weeks of PND, upset stomach , and a dry cough. No fever. 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    Review of Systems  Constitutional: Negative.   HENT: Positive for congestion and postnasal drip. Negative for sinus pressure.   Eyes: Negative.   Respiratory: Positive for cough. Negative for shortness of breath and wheezing.   Cardiovascular: Negative.        Objective:   Physical Exam  Constitutional: She appears well-developed and well-nourished.  HENT:  Right Ear: External ear normal.  Left Ear: External ear normal.  Nose: Nose normal.  Mouth/Throat: Oropharynx is clear and moist.  Eyes: Conjunctivae are normal.  Cardiovascular: Normal rate, regular rhythm, normal heart sounds and intact distal pulses.   Pulmonary/Chest: Effort normal and breath sounds normal.  Lymphadenopathy:    She has no cervical adenopathy.          Assessment & Plan:  The cough ssems to be from PND, likely from a sinusitis. Treat with Zithromax.

## 2015-01-09 NOTE — Progress Notes (Signed)
Pre visit review using our clinic review tool, if applicable. No additional management support is needed unless otherwise documented below in the visit note. 

## 2015-02-09 ENCOUNTER — Inpatient Hospital Stay (HOSPITAL_COMMUNITY)
Admission: EM | Admit: 2015-02-09 | Discharge: 2015-02-13 | DRG: 308 | Disposition: A | Discharge status: Home / Self Care | Payer: BLUE CROSS/BLUE SHIELD | Attending: Cardiology | Admitting: Cardiology

## 2015-02-09 ENCOUNTER — Encounter (HOSPITAL_COMMUNITY): Payer: Self-pay | Admitting: *Deleted

## 2015-02-09 ENCOUNTER — Ambulatory Visit (INDEPENDENT_AMBULATORY_CARE_PROVIDER_SITE_OTHER): Payer: BLUE CROSS/BLUE SHIELD | Admitting: Family Medicine

## 2015-02-09 ENCOUNTER — Encounter: Payer: Self-pay | Admitting: Family Medicine

## 2015-02-09 ENCOUNTER — Emergency Department (HOSPITAL_COMMUNITY): Payer: BLUE CROSS/BLUE SHIELD

## 2015-02-09 VITALS — BP 124/102 | HR 149 | Temp 98.7°F | Ht 60.0 in | Wt 106.0 lb

## 2015-02-09 DIAGNOSIS — K222 Esophageal obstruction: Secondary | ICD-10-CM

## 2015-02-09 DIAGNOSIS — K219 Gastro-esophageal reflux disease without esophagitis: Secondary | ICD-10-CM | POA: Diagnosis present

## 2015-02-09 DIAGNOSIS — I110 Hypertensive heart disease with heart failure: Secondary | ICD-10-CM | POA: Diagnosis present

## 2015-02-09 DIAGNOSIS — I340 Nonrheumatic mitral (valve) insufficiency: Secondary | ICD-10-CM | POA: Diagnosis not present

## 2015-02-09 DIAGNOSIS — R002 Palpitations: Secondary | ICD-10-CM | POA: Diagnosis not present

## 2015-02-09 DIAGNOSIS — I248 Other forms of acute ischemic heart disease: Secondary | ICD-10-CM

## 2015-02-09 DIAGNOSIS — I480 Paroxysmal atrial fibrillation: Secondary | ICD-10-CM | POA: Diagnosis present

## 2015-02-09 DIAGNOSIS — I509 Heart failure, unspecified: Secondary | ICD-10-CM | POA: Diagnosis not present

## 2015-02-09 DIAGNOSIS — M810 Age-related osteoporosis without current pathological fracture: Secondary | ICD-10-CM | POA: Diagnosis present

## 2015-02-09 DIAGNOSIS — I4891 Unspecified atrial fibrillation: Secondary | ICD-10-CM | POA: Diagnosis present

## 2015-02-09 DIAGNOSIS — I429 Cardiomyopathy, unspecified: Secondary | ICD-10-CM | POA: Diagnosis present

## 2015-02-09 DIAGNOSIS — I2489 Other forms of acute ischemic heart disease: Secondary | ICD-10-CM

## 2015-02-09 DIAGNOSIS — I5021 Acute systolic (congestive) heart failure: Secondary | ICD-10-CM | POA: Diagnosis present

## 2015-02-09 DIAGNOSIS — I5023 Acute on chronic systolic (congestive) heart failure: Secondary | ICD-10-CM | POA: Diagnosis present

## 2015-02-09 DIAGNOSIS — I5022 Chronic systolic (congestive) heart failure: Secondary | ICD-10-CM | POA: Diagnosis present

## 2015-02-09 DIAGNOSIS — E0781 Sick-euthyroid syndrome: Secondary | ICD-10-CM | POA: Diagnosis present

## 2015-02-09 DIAGNOSIS — Z79899 Other long term (current) drug therapy: Secondary | ICD-10-CM

## 2015-02-09 DIAGNOSIS — Z888 Allergy status to other drugs, medicaments and biological substances status: Secondary | ICD-10-CM | POA: Diagnosis not present

## 2015-02-09 DIAGNOSIS — R7989 Other specified abnormal findings of blood chemistry: Secondary | ICD-10-CM | POA: Diagnosis not present

## 2015-02-09 HISTORY — DX: Esophageal obstruction: K22.2

## 2015-02-09 HISTORY — DX: Chronic systolic (congestive) heart failure: I50.22

## 2015-02-09 HISTORY — DX: Paroxysmal atrial fibrillation: I48.0

## 2015-02-09 LAB — BASIC METABOLIC PANEL
Anion gap: 12 (ref 5–15)
BUN: 11 mg/dL (ref 6–20)
CO2: 23 mmol/L (ref 22–32)
CREATININE: 0.92 mg/dL (ref 0.44–1.00)
Calcium: 9.6 mg/dL (ref 8.9–10.3)
Chloride: 106 mmol/L (ref 101–111)
GFR calc Af Amer: >60 mL/min (ref >60)
GLUCOSE: 116 mg/dL — AB (ref 65–99)
Potassium: 3.8 mmol/L (ref 3.5–5.1)
Sodium: 141 mmol/L (ref 135–145)

## 2015-02-09 LAB — BRAIN NATRIURETIC PEPTIDE: B Natriuretic Peptide: 1026.2 pg/mL — ABNORMAL HIGH (ref 0.0–100.0)

## 2015-02-09 LAB — I-STAT TROPONIN, ED: Troponin i, poc: 0.09 ng/mL (ref 0.00–0.08)

## 2015-02-09 LAB — TSH: TSH: 2.948 u[IU]/mL (ref 0.350–4.500)

## 2015-02-09 LAB — HEPARIN LEVEL (UNFRACTIONATED): HEPARIN UNFRACTIONATED: 0.15 [IU]/mL — AB (ref 0.30–0.70)

## 2015-02-09 LAB — CBC
HCT: 45 % (ref 36.0–46.0)
Hemoglobin: 14.5 g/dL (ref 12.0–15.0)
MCH: 28.9 pg (ref 26.0–34.0)
MCHC: 32.2 g/dL (ref 30.0–36.0)
MCV: 89.8 fL (ref 78.0–100.0)
PLATELETS: 305 10*3/uL (ref 150–400)
RBC: 5.01 MIL/uL (ref 3.87–5.11)
RDW: 13 % (ref 11.5–15.5)
WBC: 7.8 10*3/uL (ref 4.0–10.5)

## 2015-02-09 LAB — TROPONIN I: TROPONIN I: 0.07 ng/mL — AB (ref <0.031)

## 2015-02-09 LAB — MAGNESIUM: Magnesium: 1.7 mg/dL (ref 1.7–2.4)

## 2015-02-09 LAB — PROTIME-INR
INR: 1.17 (ref 0.00–1.49)
Prothrombin Time: 15 seconds (ref 11.6–15.2)

## 2015-02-09 LAB — T4, FREE: FREE T4: 1.33 ng/dL — AB (ref 0.61–1.12)

## 2015-02-09 MED ORDER — DILTIAZEM LOAD VIA INFUSION
10.0000 mg | Freq: Once | INTRAVENOUS | Status: AC
  Administered 2015-02-09: 10 mg via INTRAVENOUS
  Filled 2015-02-09: qty 10

## 2015-02-09 MED ORDER — HEPARIN (PORCINE) IN NACL 100-0.45 UNIT/ML-% IJ SOLN
950.0000 [IU]/h | INTRAMUSCULAR | Status: AC
  Administered 2015-02-09: 700 [IU]/h via INTRAVENOUS
  Filled 2015-02-09: qty 250

## 2015-02-09 MED ORDER — ZOLPIDEM TARTRATE 5 MG PO TABS
5.0000 mg | ORAL_TABLET | Freq: Every evening | ORAL | Status: DC | PRN
  Administered 2015-02-10 – 2015-02-12 (×4): 5 mg via ORAL
  Filled 2015-02-09 (×4): qty 1

## 2015-02-09 MED ORDER — PANTOPRAZOLE SODIUM 40 MG PO TBEC
40.0000 mg | DELAYED_RELEASE_TABLET | Freq: Every day | ORAL | Status: DC
  Administered 2015-02-10 – 2015-02-13 (×4): 40 mg via ORAL
  Filled 2015-02-09 (×4): qty 1

## 2015-02-09 MED ORDER — LANSOPRAZOLE 30 MG PO CPDR: 30.0000 mg | DELAYED_RELEASE_CAPSULE | Freq: Every day | ORAL | Status: DC

## 2015-02-09 MED ORDER — ONDANSETRON HCL 4 MG/2ML IJ SOLN: 4.0000 mg | Freq: Four times a day (QID) | INTRAMUSCULAR | Status: DC | PRN

## 2015-02-09 MED ORDER — ASPIRIN EC 81 MG PO TBEC
81.0000 mg | DELAYED_RELEASE_TABLET | Freq: Every day | ORAL | Status: DC
Start: 2015-02-09 — End: 2015-02-10
  Filled 2015-02-09 (×2): qty 1

## 2015-02-09 MED ORDER — HEPARIN BOLUS VIA INFUSION
3000.0000 [IU] | Freq: Once | INTRAVENOUS | Status: AC
  Administered 2015-02-09: 3000 [IU] via INTRAVENOUS
  Filled 2015-02-09: qty 3000

## 2015-02-09 MED ORDER — ACETAMINOPHEN 325 MG PO TABS: 650.0000 mg | ORAL_TABLET | ORAL | Status: DC | PRN

## 2015-02-09 MED ORDER — ALPRAZOLAM 0.25 MG PO TABS: 0.2500 mg | ORAL_TABLET | Freq: Two times a day (BID) | ORAL | Status: DC | PRN

## 2015-02-09 MED ORDER — POTASSIUM CHLORIDE CRYS ER 20 MEQ PO TBCR
40.0000 meq | EXTENDED_RELEASE_TABLET | Freq: Once | ORAL | Status: AC
  Administered 2015-02-09: 40 meq via ORAL
  Filled 2015-02-09: qty 2

## 2015-02-09 MED ORDER — DILTIAZEM HCL 100 MG IV SOLR: 5.0000 mg/h | INTRAVENOUS | Status: DC

## 2015-02-09 MED ORDER — ENSURE ENLIVE PO LIQD
237.0000 mL | Freq: Two times a day (BID) | ORAL | Status: DC
  Administered 2015-02-10 – 2015-02-12 (×5): 237 mL via ORAL

## 2015-02-09 MED ORDER — FUROSEMIDE 10 MG/ML IJ SOLN
40.0000 mg | Freq: Once | INTRAMUSCULAR | Status: AC
  Administered 2015-02-09: 40 mg via INTRAVENOUS
  Filled 2015-02-09: qty 4

## 2015-02-09 MED ORDER — DEXTROSE 5 % IV SOLN
5.0000 mg/h | INTRAVENOUS | Status: DC
  Administered 2015-02-09: 5 mg/h via INTRAVENOUS
  Administered 2015-02-09: 15 mg/h via INTRAVENOUS
  Filled 2015-02-09 (×2): qty 100

## 2015-02-09 MED ORDER — METOPROLOL TARTRATE 25 MG PO TABS
25.0000 mg | ORAL_TABLET | Freq: Two times a day (BID) | ORAL | Status: DC
  Administered 2015-02-09: 25 mg via ORAL
  Filled 2015-02-09 (×2): qty 1

## 2015-02-09 MED ORDER — FUROSEMIDE 10 MG/ML IJ SOLN
40.0000 mg | Freq: Once | INTRAMUSCULAR | Status: AC
  Administered 2015-02-10: 40 mg via INTRAVENOUS
  Filled 2015-02-09: qty 4

## 2015-02-09 MED ORDER — SODIUM CHLORIDE 0.9 % IV SOLN
INTRAVENOUS | Status: DC
  Administered 2015-02-10: 10 mL/h via INTRAVENOUS

## 2015-02-09 NOTE — Progress Notes (Signed)
ANTICOAGULATION CONSULT NOTE - Initial Consult  Pharmacy Consult for Heparin Indication: atrial fibrillation  Allergies  Allergen Reactions  . Ramipril     cough    Patient Measurements:   Heparin Dosing Weight: 48 kg  Vital Signs: Temp: 98 F (36.7 C) (10/27 1122) Temp Source: Oral (10/27 1122) BP: 109/91 mmHg (10/27 1430) Pulse Rate: 106 (10/27 1430)  Labs:  Recent Labs  02/09/15 1143  HGB 14.5  HCT 45.0  PLT 305  CREATININE 0.92    Estimated Creatinine Clearance: 43.8 mL/min (by C-G formula based on Cr of 0.92).   Medical History: Past Medical History  Diagnosis Date  . GERD (gastroesophageal reflux disease)     see's Dr. Collene Mares  . Gynecological examination     see's Dr. Gaetano Net   . Sebaceous cyst     on back of neck, has seen Dr. Ronnald Collum  . Osteoporosis     sees Dr. Carrolyn Meiers   . Full dentures     Medications:   (Not in a hospital admission) Scheduled:  . furosemide  40 mg Intravenous Once   Infusions:  . diltiazem (CARDIZEM) infusion 15 mg/hr (02/09/15 1439)    Assessment: 64yo female with history of GERD presents with SOB and irregular heartbeat. Pharmacy is consulted to dose heparin for atrial fibrillation. CBC wnl, BNP 1026.2, Trop 0.09, sCr 0.92.  Goal of Therapy:  Heparin level 0.3-0.7 units/ml Monitor platelets by anticoagulation protocol: Yes   Plan:  Give 3000 units bolus x 1 Start heparin infusion at 700 units/hr Check anti-Xa level in 6 hours and daily while on heparin Continue to monitor H&H and platelets  Andrey Cota. Diona Foley, PharmD Clinical Pharmacist Pager [phone] 02/09/2015,2:51 PM

## 2015-02-09 NOTE — ED Provider Notes (Signed)
CSN: [id number]     Arrival date & time 02/09/15  1112 History   First MD Initiated Contact with Patient 02/09/15 1128     Chief Complaint  Patient presents with  . Atrial Fibrillation  . Cough  . Shortness of Breath    Patient is a 64 y.o. female presenting with atrial fibrillation, cough, and shortness of breath. The history is provided by the patient. No language interpreter was used.  Atrial Fibrillation Associated symptoms include shortness of breath.  Cough Associated symptoms: shortness of breath   Shortness of Breath Associated symptoms: cough    Mackenzie [name] presents for evaluation of irregular heartbeat and shortness of breath. She reports intermittent shortness breath and cough for the last few weeks. Yesterday she had increased cough that was dry in nature. She feels that her heart is beating fast since yesterday. Her shortness of breath is worse lying down at night. She saw her family doctor today and was referred to the emergency department for further evaluation. She denies any medical problems and takes no medications. She has a family history of heart disease.  Past Medical History  Diagnosis Date  . GERD (gastroesophageal reflux disease)     see's Dr. Collene Mares  . Gynecological examination     see's Dr. Gaetano Net   . Sebaceous cyst     on back of neck, has seen Dr. Ronnald Collum  . Osteoporosis     sees Dr. Carrolyn Meiers   . Full dentures    Past Surgical History  Procedure Laterality Date  . Esophagogastroduodenoscopy  06/01/2003    12/13  . Colonoscopy  09/24/2002  . Esophagogastroduodenoscopy (egd) with esophageal dilation    . Rigid esophagoscopy N/A 01/18/2013    Procedure: RIGID ESOPHAGOSCOPY WITH ESPHAGEAL Balloon DILATION ;  Surgeon: Melida Quitter, MD;  Location: [place];  Service: ENT;  Laterality: N/A;   History reviewed. No pertinent family history. Social History  Substance Use Topics  . Smoking status: Never Smoker   . Smokeless tobacco:  Never Used  . Alcohol Use: No   OB History    No data available     Review of Systems  Respiratory: Positive for cough and shortness of breath.   All other systems reviewed and are negative.     Allergies  Ramipril  Home Medications   Prior to Admission medications   Medication Sig Start Date End Date Taking? Authorizing Provider  lansoprazole (PREVACID) 30 MG capsule Take 1 capsule (30 mg total) by mouth daily. 02/09/15  Yes Laurey Morale, MD  benzonatate (TESSALON) 200 MG capsule Take 1 capsule (200 mg total) by mouth 2 (two) times daily as needed for cough. Patient not taking: Reported on 01/09/2015 11/24/13   Laurey Morale, MD   BP 139/84 mmHg  Pulse 48  Temp(Src) 98 F (36.7 C) (Oral)  Resp 15  SpO2 97% Physical Exam  Constitutional: She is oriented to person, place, and time. She appears well-developed and well-nourished.  HENT:  Head: Normocephalic and atraumatic.  Cardiovascular:  No murmur heard. Tachycardic and irregular  Pulmonary/Chest: Effort normal and breath sounds normal. No respiratory distress.  Abdominal: Soft. There is no tenderness. There is no rebound and no guarding.  Musculoskeletal: She exhibits no edema or tenderness.  Neurological: She is alert and oriented to person, place, and time.  Skin: Skin is warm and dry.  Psychiatric: She has a normal mood and affect. Her behavior is normal.  Nursing note and vitals reviewed.  ED Course  Procedures (including critical care time) Labs Review Labs Reviewed  BASIC METABOLIC PANEL - Abnormal; Notable for the following:    Glucose, Bld 116 (*)    All other components within normal limits  BRAIN NATRIURETIC PEPTIDE - Abnormal; Notable for the following:    B Natriuretic Peptide 1026.2 (*)    All other components within normal limits  I-STAT TROPOININ, ED - Abnormal; Notable for the following:    Troponin i, poc 0.09 (*)    All other components within normal limits  CBC  HEPARIN LEVEL  (UNFRACTIONATED)    Imaging Review Dg Chest Portable 1 View  02/09/2015  CLINICAL DATA:  Cough and shortness of breath EXAM: PORTABLE CHEST 1 VIEW COMPARISON:  Thoracic spine study 06/17/2007 FINDINGS: Cardiopericardial enlargement which is mild. Negative aortic contours when accounting for rotation. Small pleural effusions with interstitial coarsening and cephalized blood flow. No focal consolidation. S shape scoliosis. IMPRESSION: Pulmonary venous congestion with trace effusions, likely mild CHF. Electronically Signed   By: Monte Fantasia M.D.   On: 02/09/2015 12:20   I have personally reviewed and evaluated these images and lab results as part of my medical decision-making.   EKG Interpretation   Date/Time:  Thursday February 09 2015 11:20:04 EDT Ventricular Rate:  159 PR Interval:    QRS Duration: 88 QT Interval:  294 QTC Calculation: 478 R Axis:   64 Text Interpretation:  Atrial fibrillation Possible Anterior infarct , age  undetermined Abnormal ECG Reconfirmed by Hazle Coca [phone]) on 02/09/2015  4:00:05 PM      MDM   Final diagnoses:  Atrial fibrillation with RVR ([place])    Pt here for SOB, palpitations, chest discomfort, noted to be in afib with RVR in ED.  CXR with mild pulmonary edema, pt euvolemic on exam.  HR improved on diltiazem drip.  Cardiology consulted re new onset afib with RVR.  Plan to admit for further mgmt.  Pt updated of diagnosis and treatment plan.      Quintella Reichert, MD 02/09/15 1600

## 2015-02-09 NOTE — ED Notes (Signed)
Pts daughter updated on pts status & the plan of care per pt request

## 2015-02-09 NOTE — ED Notes (Signed)
Patient went to doctors office today for unrelieved cough, sob, dizziness, and fatigue. Patient noted to be in new onset afib.

## 2015-02-09 NOTE — Progress Notes (Signed)
Pre visit review using our clinic review tool, if applicable. No additional management support is needed unless otherwise documented below in the visit note. 

## 2015-02-09 NOTE — Progress Notes (Signed)
   Subjective:    Patient ID: Mackenzie [name], female    DOB: [birth year], 64 y.o.   MRN: [id number]  HPI Here for the onset last night of SOB and a sensation of her heart racing. No chest pains or sweats or nausea. She has had a dry cough for about a week prior to that. She has never felt this way before.    Review of Systems  Constitutional: Negative.   HENT: Negative.   Eyes: Negative.   Respiratory: Positive for cough and shortness of breath. Negative for chest tightness and wheezing.   Cardiovascular: Positive for palpitations. Negative for chest pain and leg swelling.  Gastrointestinal: Negative.   Neurological: Negative.        Objective:   Physical Exam  Constitutional: She is oriented to person, place, and time. She appears well-developed and well-nourished. No distress.  Eyes: Conjunctivae are normal. Pupils are equal, round, and reactive to light.  Neck: Neck supple. No thyromegaly present.  Cardiovascular: Normal heart sounds and intact distal pulses.   No murmur heard. Rapid rate, irregular rhythm. EKG shows atrial fibrillation  Pulmonary/Chest: Effort normal and breath sounds normal. No respiratory distress. She has no wheezes. She has no rales.  Lymphadenopathy:    She has no cervical adenopathy.  Neurological: She is alert and oriented to person, place, and time.          Assessment & Plan:  New onset atrial fibrillation with a rapid ventricular response. She was given four 81 mg tablets of aspirin here in the clinic. She will drive herself directly to [facility] ER now for further treatment.

## 2015-02-09 NOTE — ED Notes (Signed)
Spoke with lab BNP to be ran off of lavender tube that was sent for CBC

## 2015-02-09 NOTE — H&P (Signed)
Mackenzie [name] is an 64 y.o. female.    Primary Cardiologist: new  PCP: Laurey Morale, MD  Chief Complaint: SOB and heart racing  HPI:  64 year old female seen by PCP today for SOB and Heart racing that began last pm.   No prior cardiac hx.  She does have + FH with 3 brothers and her other with CAD and MIs.  She has no hx of DM, Thyroid issues or  CVA.  She was on BP med for short while for HTN but her BP then became too low.  So no longer taking.  She had a cough 2 weeks ago treated with zpak and cough medicine and it did get better eventually but returned 2 days ago and then last pm SOB.  She went to [facility] last night and was so weak and SOB did not think she would make it back to bed.  No chest pain.        LABS:  BNP 1026.2, troponin 0.09,  EKG a fib with RVR poor R wave progression in pericardial leads.  Mild CHF on CXR.    Has received 10 mg IV dilt with controlled HR.  Now on drip but HR increased to 120 will increase to 15 mg Hr.  She has trouble swallowing pills, has had esophageal strictures with dilatation.     Past Medical History  Diagnosis Date  . GERD (gastroesophageal reflux disease)     see's Dr. Collene Mares  . Gynecological examination     see's Dr. Gaetano Net   . Sebaceous cyst     on back of neck, has seen Dr. Ronnald Collum  . Osteoporosis     sees Dr. Carrolyn Meiers   . Full dentures     Past Surgical History  Procedure Laterality Date  . Esophagogastroduodenoscopy  06/01/2003    12/13  . Colonoscopy  09/24/2002  . Esophagogastroduodenoscopy (egd) with esophageal dilation    . Rigid esophagoscopy N/A 01/18/2013    Procedure: RIGID ESOPHAGOSCOPY WITH ESPHAGEAL Balloon DILATION ;  Surgeon: Melida Quitter, MD;  Location: [place];  Service: ENT;  Laterality: N/A;    Family History  Problem Relation Age of Onset  . Heart attack Mother 62    died from massive heart attack  . Aneurysm Father     cerebral  . Hypertension Sister   . Diabetes  type II Sister   . Heart attack Brother   . Hypertension Sister   . Heart attack Brother    Social History:  reports that she has never smoked. She has never used smokeless tobacco. She reports that she does not drink alcohol or use illicit drugs.  Allergies:  Allergies  Allergen Reactions  . Ramipril     cough    OUTPATIENT MEDICATIONS: No current facility-administered medications on file prior to encounter.   Current Outpatient Prescriptions on File Prior to Encounter  Medication Sig Dispense Refill  . lansoprazole (PREVACID) 30 MG capsule Take 1 capsule (30 mg total) by mouth daily. 90 capsule 3  . benzonatate (TESSALON) 200 MG capsule Take 1 capsule (200 mg total) by mouth 2 (two) times daily as needed for cough. (Patient not taking: Reported on 01/09/2015) 60 capsule 2     Results for orders placed or performed during the hospital encounter of 02/09/15 (from the past 48 hour(s))  Basic metabolic panel     Status: Abnormal   Collection Time: 02/09/15 11:43 AM  Result Value Ref Range   Sodium 141 135 - 145 mmol/L   Potassium 3.8 3.5 - 5.1 mmol/L   Chloride 106 101 - 111 mmol/L   CO2 23 22 - 32 mmol/L   Glucose, Bld 116 (H) 65 - 99 mg/dL   BUN 11 6 - 20 mg/dL   Creatinine, Ser 0.92 0.44 - 1.00 mg/dL   Calcium 9.6 8.9 - 10.3 mg/dL   GFR calc non Af Amer >60 >60 mL/min   GFR calc Af Amer >60 >60 mL/min    Comment: (NOTE) The eGFR has been calculated using the CKD EPI equation. This calculation has not been validated in all clinical situations. eGFR's persistently <60 mL/min signify possible Chronic Kidney Disease.    Anion gap 12 5 - 15  CBC     Status: None   Collection Time: 02/09/15 11:43 AM  Result Value Ref Range   WBC 7.8 4.0 - 10.5 K/uL   RBC 5.01 3.87 - 5.11 MIL/uL   Hemoglobin 14.5 12.0 - 15.0 g/dL   HCT 45.0 36.0 - 46.0 %   MCV 89.8 78.0 - 100.0 fL   MCH 28.9 26.0 - 34.0 pg   MCHC 32.2 30.0 - 36.0 g/dL   RDW 13.0 11.5 - 15.5 %   Platelets 305 150 - 400  K/uL  Brain natriuretic peptide     Status: Abnormal   Collection Time: 02/09/15 11:53 AM  Result Value Ref Range   B Natriuretic Peptide 1026.2 (H) 0.0 - 100.0 pg/mL  I-stat troponin, ED     Status: Abnormal   Collection Time: 02/09/15 11:54 AM  Result Value Ref Range   Troponin i, poc 0.09 (HH) 0.00 - 0.08 ng/mL   Comment NOTIFIED PHYSICIAN    Comment 3            Comment: Due to the release kinetics of cTnI, a negative result within the first hours of the onset of symptoms does not rule out myocardial infarction with certainty. If myocardial infarction is still suspected, repeat the test at appropriate intervals.    Dg Chest Portable 1 View  02/09/2015  CLINICAL DATA:  Cough and shortness of breath EXAM: PORTABLE CHEST 1 VIEW COMPARISON:  Thoracic spine study 06/17/2007 FINDINGS: Cardiopericardial enlargement which is mild. Negative aortic contours when accounting for rotation. Small pleural effusions with interstitial coarsening and cephalized blood flow. No focal consolidation. S shape scoliosis. IMPRESSION: Pulmonary venous congestion with trace effusions, likely mild CHF. Electronically Signed   By: Monte Fantasia M.D.   On: 02/09/2015 12:20    ROS: General: recent  Cough recently no colds or fevers, no weight changes Skin:no rashes or ulcers HEENT:no blurred vision, no congestion CV:see HPI PUL:see HPI GI:+ diarrhea has since resolved no constipation occ bright blood with stools   no indigestion GU:no hematuria, no dysuria MS:no joint pain, no claudication Neuro:no syncope, no lightheadedness Endo:no diabetes, no thyroid disease   Blood pressure 109/91, pulse 106, temperature 98 F (36.7 C), temperature source Oral, resp. rate 21, SpO2 97 %. PE: General:Pleasant affect, NAD Skin:Warm and dry, brisk capillary refill HEENT:normocephalic, sclera clear, mucus membranes moist Neck:supple, + JVD, no bruits  Heart:irreg irreg without murmur, gallup, rub or click Lungs:  with rales in bases, no rhonchi, or wheezes ZYY:QMGN, non tender, + BS, do not palpate liver spleen or masses Ext:no lower ext edema, 2+ pedal pulses, 2+ radial pulses Neuro:alert and oriented X 3, MAE, follows commands, + facial symmetry    Assessment/Plan Principal Problem:  Atrial fibrillation with RVR ([place])- continue with IV dilt and add BB.  Will increase rate to 15 mg hr on dilt.  CHA2DS2VASC score of 3.  Will add IV heparin and change to NOAC prior to discharge.   Active Problems:   Acute CHF (congestive heart failure) ([place])- addI V lasix and check echo.      Elevated troponin will follow may be due to a fib with RVR and HF. IF troponin remains low would do outpt stress. But if increases would do inpt nuc or cath.       [place] Nurse Practitioner Certified [place] Pager [phone] or after 5pm or weekends call [phone] 02/09/2015, 2:41 PM    Patient seen and examined and history reviewed. Agree with above findings and plan. 64 yo WF presents with new onset of atrial fibrillation with RVR and CHF. No prior cardiac history. Presents with worsening cough and SOB. Not aware that heart out of rhythm. Ecg shows AFib with RVR. BNP elevated. CXR with pulmonary edema and pleural effusion. On exam she has rales. No murmur. No edema. CHAD-vasc score of 3. Will admit to telemetry. Start IV heparin. IV diltiazem for rate control- hopefully transition to po metoprolol. IV diuresis. Echo ordered. Check TSH. If Echo OK will transition to oral anticoagulants. Troponin mildly elevated- I suspect this is related to demand ischemia. She does have a history of dysphagia and has had prior esophageal dilatations. Will tailor therapy as much as possible.   Karleen Seebeck [place], [place] 02/09/2015 2:48 PM

## 2015-02-09 NOTE — ED Notes (Signed)
Pt aware that her daughter called & is in route

## 2015-02-09 NOTE — ED Notes (Signed)
Heart healthy dinner tray ordered 

## 2015-02-10 ENCOUNTER — Inpatient Hospital Stay (HOSPITAL_COMMUNITY): Payer: BLUE CROSS/BLUE SHIELD

## 2015-02-10 DIAGNOSIS — I4891 Unspecified atrial fibrillation: Secondary | ICD-10-CM

## 2015-02-10 DIAGNOSIS — I5021 Acute systolic (congestive) heart failure: Secondary | ICD-10-CM | POA: Diagnosis present

## 2015-02-10 LAB — BASIC METABOLIC PANEL
Anion gap: 9 (ref 5–15)
BUN: 12 mg/dL (ref 6–20)
CHLORIDE: 102 mmol/L (ref 101–111)
CO2: 25 mmol/L (ref 22–32)
CREATININE: 1 mg/dL (ref 0.44–1.00)
Calcium: 9 mg/dL (ref 8.9–10.3)
GFR calc Af Amer: >60 mL/min (ref >60)
GFR calc non Af Amer: 58 mL/min — ABNORMAL LOW (ref >60)
Glucose, Bld: 111 mg/dL — ABNORMAL HIGH (ref 65–99)
POTASSIUM: 3.7 mmol/L (ref 3.5–5.1)
SODIUM: 136 mmol/L (ref 135–145)

## 2015-02-10 LAB — TROPONIN I
TROPONIN I: 0.06 ng/mL — AB (ref <0.031)
TROPONIN I: 0.06 ng/mL — AB (ref <0.031)

## 2015-02-10 LAB — CBC
HCT: 39.9 % (ref 36.0–46.0)
Hemoglobin: 12.8 g/dL (ref 12.0–15.0)
MCH: 28.2 pg (ref 26.0–34.0)
MCHC: 32.1 g/dL (ref 30.0–36.0)
MCV: 87.9 fL (ref 78.0–100.0)
Platelets: 282 10*3/uL (ref 150–400)
RBC: 4.54 MIL/uL (ref 3.87–5.11)
RDW: 13 % (ref 11.5–15.5)
WBC: 7.7 10*3/uL (ref 4.0–10.5)

## 2015-02-10 LAB — LIPID PANEL
CHOL/HDL RATIO: 3 ratio
Cholesterol: 157 mg/dL (ref 0–200)
HDL: 53 mg/dL (ref >40)
LDL CALC: 92 mg/dL (ref 0–99)
Triglycerides: 61 mg/dL (ref <150)
VLDL: 12 mg/dL (ref 0–40)

## 2015-02-10 LAB — HEMOGLOBIN A1C
Hgb A1c MFr Bld: 5.7 % — ABNORMAL HIGH (ref 4.8–5.6)
Mean Plasma Glucose: 117 mg/dL

## 2015-02-10 LAB — HEPARIN LEVEL (UNFRACTIONATED): HEPARIN UNFRACTIONATED: 0.27 [IU]/mL — AB (ref 0.30–0.70)

## 2015-02-10 MED ORDER — ADULT MULTIVITAMIN W/MINERALS CH
1.0000 | ORAL_TABLET | Freq: Every day | ORAL | Status: DC
  Administered 2015-02-12: 1 via ORAL
  Filled 2015-02-10 (×3): qty 1

## 2015-02-10 MED ORDER — METOPROLOL TARTRATE 50 MG PO TABS
50.0000 mg | ORAL_TABLET | Freq: Two times a day (BID) | ORAL | Status: DC
Start: 2015-02-10 — End: 2015-02-13
  Administered 2015-02-10 – 2015-02-13 (×7): 50 mg via ORAL
  Filled 2015-02-10 (×7): qty 1

## 2015-02-10 MED ORDER — RIVAROXABAN 15 MG PO TABS
15.0000 mg | ORAL_TABLET | Freq: Every day | ORAL | Status: DC
  Administered 2015-02-10 – 2015-02-12 (×3): 15 mg via ORAL
  Filled 2015-02-10 (×4): qty 1

## 2015-02-10 NOTE — Progress Notes (Addendum)
Mackenzie [name] Profile: 64 y/o female with no prior cardiac history and no prior h/o DM, thyroid disease or CVA, admitted for new onset atrial fibrillation w/ RVR and acute CHF.   Subjective: Breathing improved. No chest pain. No palpitations.   Objective: Vital signs in last 24 hours: Temp:  [97.4 F (36.3 C)-98.8 F (37.1 C)] 98.8 F (37.1 C) (10/28 0627) Pulse Rate:  [32-157] 90 (10/27 2102) Resp:  [15-32] 17 (10/28 0727) BP: (78-154)/(50-110) 106/90 mmHg (10/28 0727) SpO2:  [93 %-100 %] 97 % (10/27 2100) Weight:  [101 lb 3.2 oz (45.904 kg)-106 lb (48.081 kg)] 101 lb 8 oz (46.04 kg) (10/28 0627) Last BM Date: 02/09/15  Intake/Output from previous day: 10/27 0701 - 10/28 0700 In: 560.8 [P.O.:240; I.V.:320.8] Out: 1500 [Urine:1500] Intake/Output this shift:    Medications Current Facility-Administered Medications  Medication Dose Route Frequency Provider Last Rate Last Dose  . 0.9 %  sodium chloride infusion   Intravenous Continuous Isaiah Serge, NP 10 mL/hr at 02/10/15 0020 10 mL/hr at 02/10/15 0020  . acetaminophen (TYLENOL) tablet 650 mg  650 mg Oral Q4H PRN Isaiah Serge, NP      . ALPRAZolam Duanne Moron) tablet 0.25 mg  0.25 mg Oral BID PRN Isaiah Serge, NP      . aspirin EC tablet 81 mg  81 mg Oral Daily Isaiah Serge, NP   81 mg at 02/09/15 2221  . diltiazem (CARDIZEM) 100 mg in dextrose 5 % 100 mL (1 mg/mL) infusion  5-15 mg/hr Intravenous Continuous Isaiah Serge, NP   Stopped at 02/10/15 0009  . feeding supplement (ENSURE ENLIVE) (ENSURE ENLIVE) liquid 237 mL  237 mL Oral BID BM Cyncere Sontag M Martinique, MD      . heparin ADULT infusion 100 units/mL (25000 units/250 mL)  850 Units/hr Intravenous Continuous Jake Church Masters, RPH 8.5 mL/hr at 02/10/15 0020 850 Units/hr at 02/10/15 0020  . metoprolol tartrate (LOPRESSOR) tablet 25 mg  25 mg Oral BID Isaiah Serge, NP   25 mg at 02/09/15 2102  . ondansetron (ZOFRAN) injection 4 mg  4 mg Intravenous Q6H PRN Isaiah Serge, NP       . pantoprazole (PROTONIX) EC tablet 40 mg  40 mg Oral Daily Isaiah Serge, NP      . zolpidem (AMBIEN) tablet 5 mg  5 mg Oral QHS PRN Isaiah Serge, NP   5 mg at 02/10/15 0019    PE: General appearance: alert, cooperative and no distress Neck: no carotid bruit and no JVD Lungs: faint rales over LLL field Heart: irregularly irregular rhythm and tachy rate Extremities: no LEE Pulses: 2+ and symmetric Skin: warm and dry Neurologic: Grossly normal  Lab Results:   Recent Labs  02/09/15 1143 02/10/15 0126  WBC 7.8 7.7  HGB 14.5 12.8  HCT 45.0 39.9  PLT 305 282   BMET  Recent Labs  02/09/15 1143 02/10/15 0126  NA 141 136  K 3.8 3.7  CL 106 102  CO2 23 25  GLUCOSE 116* 111*  BUN 11 12  CREATININE 0.92 1.00  CALCIUM 9.6 9.0   PT/INR  Recent Labs  02/09/15 1915  LABPROT 15.0  INR 1.17   Cholesterol  Recent Labs  02/10/15 0126  CHOL 157   Cardiac Panel (last 3 results)  Recent Labs  02/09/15 1915 02/10/15 0126  TROPONINI 0.07* 0.06*    Studies/Results: 2D Echo 02/10/15 - pending  Assessment/Plan  Principal Problem:   Atrial fibrillation  with RVR ([address]) Active Problems:   Acute CHF (congestive heart failure) ([place])   1. Atrial Fibrillation w/ RVR: TSH, K and Mg all WNL. Afebrile. WBC normal. CXR with only mild CHF but no infiltrate.  2D Echo pending. Rate is improved but still mildly tachy and irregular, in the upper 90s- low 100s. Continue PO metoprolol and Continue IV Cardizem. Transition to PO Cardizem if normal EF on echo. Continue IV heparin for now. Will need oral anticoagulation given CHA2DS2 VASc score of at least 2 (female, Age 33-74). Will wait to initiate once echo results are completed, as she may require cath if significant LV dysfunction.   2. Acute CHF: 2D echo pending. No dyspnea. Volume appears fairly stable.   3. Abnormal Troponin: flat, low-level trend. No chest pain. Suspect 2/2 demand ischemia from afib w/ RVR and acute CHF.      LOS: 1 day    Mackenzie [name] Mackenzie [name] 02/10/2015 7:58 AM Mackenzie [name] seen and examined and history reviewed. Agree with above findings and plan. Breathing is much better with good diuresis. Developed hypotension on IV diltiazem and this was discontinued. Now HR 100-120 at rest. Mild troponin elevation with flat curve consistent with demand ischemia. Echo is pending. Will increase metoprolol to 50 mg bid. Received lasix IV at 5:30 this am so will hold further diuresis today. May need to start oral lasix tomorrow am. Start Xarelto for anticoagulation and DC heparin. Given clinical improvement I think we can manage with rate control and anticoagulation and plan on DCCV in 4 weeks. Potential DC in am. If she is difficult to control or unstable we could consider TEE guided DCCV Monday. She does have a long history of swallowing problems so will try and limit pills as much as possible.   Mackenzie [name], Mackenzie [name] 02/10/2015 9:27 AM

## 2015-02-10 NOTE — Care Management Note (Addendum)
Case Management Note  Patient Details  Name: Mackenzie [name] MRN: [id number] Date of Birth: [birth year]  Subjective/Objective:  Pt admitted for A Fib. Plan is for d/c on Xarelto once pt is medically stable for d/c. Benefits check in process and will make pt aware of cost once completed.                   Action/Plan: CM will provide pt with 30 day free card. Pt will need Rx for 30 day free with no refills and the original Rx with refills. No further needs from CM at this time.    Expected Discharge Date:  02/11/15               Expected Discharge Plan:  Home/Self Care  In-House Referral:  NA  Discharge planning Services  CM Consult, Medication Assistance  Post Acute Care Choice:  NA Choice offered to:  NA  DME Arranged:  N/A DME Agency:  NA  HH Arranged:  NA HH Agency:  NA  Status of Service:  Completed, signed off  Medicare Important Message Given:    Date Medicare IM Given:    Medicare IM give by:    Date Additional Medicare IM Given:    Additional Medicare Important Message give by:     If discussed at [place] of Stay Meetings, dates discussed:    Additional Comments: [address] [name], RN,BSN [phone] CM did provide pt with 30 day free/ co pay card for Xarelto. CM did call [facility] in [place] and medication is available.   Pt copay will be $30-prior auth not required  [name], RN 02/10/2015, 11:56 AM

## 2015-02-10 NOTE — Progress Notes (Signed)
UR Completed Davontay Watlington Graves-Bigelow, RN,BSN [phone]  

## 2015-02-10 NOTE — Progress Notes (Signed)
Echo reviewed. This shows severe LV dysfunction with EF 25-30%. Mild MR and mild LAE. I explained results to patient. Explained that this could be related to her arrhythmia (tachycardia mediated) but I would recommend ruling out ischemia.  Will plan on Lexiscan Myoview in am. If normal perfusion I would focus on treating her arrhythmia and low EF. If abnormal would need to consider cardiac cath.  If Myoview perfusion is normal I would recommend a TEE guided cardioversion on Monday since I think early restoration of NSR would improve her chances of LV recovery.  Would consider addition of ARB but currently BP is too soft. She has a history of cough on ACEi in past.  Peter Martinique MD, [facility]

## 2015-02-10 NOTE — Progress Notes (Signed)
Patient b/p dropping to 51'G systolic and heart rate dropping to low 60's. Cardizem gtt turned off and Dr. Philbert Riser paged to make aware. Patient is resting comfortably with no c/o's. Will continue to monitor. [facility] [employer]

## 2015-02-10 NOTE — Discharge Instructions (Addendum)
Atrial Fibrillation °Atrial fibrillation is a type of irregular or rapid heartbeat (arrhythmia). In atrial fibrillation, the heart quivers continuously in a chaotic pattern. This occurs when parts of the heart receive disorganized signals that make the heart unable to pump blood normally. This can increase the risk for stroke, heart failure, and other heart-related conditions. There are different types of atrial fibrillation, including: °· Paroxysmal atrial fibrillation. This type starts suddenly, and it usually stops on its own shortly after it starts. °· Persistent atrial fibrillation. This type often lasts longer than a week. It may stop on its own or with treatment. °· Long-lasting persistent atrial fibrillation. This type lasts longer than 12 months. °· Permanent atrial fibrillation. This type does not go away. °Talk with your health care provider to learn about the type of atrial fibrillation that you have. °CAUSES °This condition is caused by some heart-related conditions or procedures, including: °· A heart attack. °· Coronary artery disease. °· Heart failure. °· Heart valve conditions. °· High blood pressure. °· Inflammation of the sac that surrounds the heart (pericarditis). °· Heart surgery. °· Certain heart rhythm disorders, such as Wolf-Parkinson-White syndrome. °Other causes include: °· Pneumonia. °· Obstructive sleep apnea. °· Blockage of an artery in the lungs (pulmonary embolism, or PE). °· Lung cancer. °· Chronic lung disease. °· Thyroid problems, especially if the thyroid is overactive (hyperthyroidism). °· Caffeine. °· Excessive alcohol use or illegal drug use. °· Use of some medicines, including certain decongestants and diet pills. °Sometimes, the cause cannot be found. °RISK FACTORS °This condition is more likely to develop in: °· People who are older in age. °· People who smoke. °· People who have diabetes mellitus. °· People who are overweight (obese). °· Athletes who exercise  vigorously. °SYMPTOMS °Symptoms of this condition include: °· A feeling that your heart is beating rapidly or irregularly. °· A feeling of discomfort or pain in your chest. °· Shortness of breath. °· Sudden light-headedness or weakness. °· Getting tired easily during exercise. °In some cases, there are no symptoms. °DIAGNOSIS °Your health care provider may be able to detect atrial fibrillation when taking your pulse. If detected, this condition may be diagnosed with: °· An electrocardiogram (ECG). °· A Holter monitor test that records your heartbeat patterns over a 24-hour period. °· Transthoracic echocardiogram (TTE) to evaluate how blood flows through your heart. °· Transesophageal echocardiogram (TEE) to view more detailed images of your heart. °· A stress test. °· Imaging tests, such as a CT scan or chest X-ray. °· Blood tests. °TREATMENT °The main goals of treatment are to prevent blood clots from forming and to keep your heart beating at a normal rate and rhythm. The type of treatment that you receive depends on many factors, such as your underlying medical conditions and how you feel when you are experiencing atrial fibrillation. °This condition may be treated with: °· Medicine to slow down the heart rate, bring the heart's rhythm back to normal, or prevent clots from forming. °· Electrical cardioversion. This is a procedure that resets your heart's rhythm by delivering a controlled, low-energy shock to the heart through your skin. °· Different types of ablation, such as catheter ablation, catheter ablation with pacemaker, or surgical ablation. These procedures destroy the heart tissues that send abnormal signals. When the pacemaker is used, it is placed under your skin to help your heart beat in a regular rhythm. °HOME CARE INSTRUCTIONS °· Take over-the counter and prescription medicines only as told by your health care provider. °·   If your health care provider prescribed a blood-thinning medicine  (anticoagulant), take it exactly as told. Taking too much blood-thinning medicine can cause bleeding. If you do not take enough blood-thinning medicine, you will not have the protection that you need against stroke and other problems.  Do not use tobacco products, including cigarettes, chewing tobacco, and e-cigarettes. If you need help quitting, ask your health care provider.  If you have obstructive sleep apnea, manage your condition as told by your health care provider.  Do not drink alcohol.  Do not drink beverages that contain caffeine, such as coffee, soda, and tea.  Maintain a healthy weight. Do not use diet pills unless your health care provider approves. Diet pills may make heart problems worse.  Follow diet instructions as told by your health care provider.  Exercise regularly as told by your health care provider.  Keep all follow-up visits as told by your health care provider. This is important. PREVENTION  Avoid drinking beverages that contain caffeine or alcohol.  Avoid certain medicines, especially medicines that are used for breathing problems.  Avoid certain herbs and herbal medicines, such as those that contain ephedra or ginseng.  Do not use illegal drugs, such as cocaine and amphetamines.  Do not smoke.  Manage your high blood pressure. SEEK MEDICAL CARE IF:  You notice a change in the rate, rhythm, or strength of your heartbeat.  You are taking an anticoagulant and you notice increased bruising.  You tire more easily when you exercise or exert yourself. SEEK IMMEDIATE MEDICAL CARE IF:  You have chest pain, abdominal pain, sweating, or weakness.  You feel nauseous.  You notice blood in your vomit, bowel movement, or urine.  You have shortness of breath.  You suddenly have swollen feet and ankles.  You feel dizzy.  You have sudden weakness or numbness of the face, arm, or leg, especially on one side of the body.  You have trouble speaking,  trouble understanding, or both (aphasia).  Your face or your eyelid droops on one side. These symptoms may represent a serious problem that is an emergency. Do not wait to see if the symptoms will go away. Get medical help right away. Call your local emergency services (911 in the [place] Do not drive yourself to the hospital.   This information is not intended to replace advice given to you by your health care provider. Make sure you discuss any questions you have with your health care provider.   Document Released: 04/01/2005 Document Revised: 12/21/2014 Document Reviewed: 07/27/2014 Elsevier Interactive Patient Education 2016 [place] on my medicine - XARELTO (Rivaroxaban)  This medication education was reviewed with me or my healthcare representative as part of my discharge preparation.  The pharmacist that spoke with me during my hospital stay was:  Reginia Naas, [facility]  Why was Xarelto prescribed for you? Xarelto was prescribed for you to reduce the risk of a blood clot forming that can cause a stroke if you have a medical condition called atrial fibrillation (a type of irregular heartbeat).  What do you need to know about xarelto ? Take your Xarelto ONCE DAILY at the same time every day with your evening meal. If you have difficulty swallowing the tablet whole, you may crush it and mix in applesauce just prior to taking your dose.  Take Xarelto exactly as prescribed by your doctor and DO NOT stop taking Xarelto without talking to the doctor who prescribed the medication.  Stopping without other  stroke prevention medication to take the place of Xarelto may increase your risk of developing a clot that causes a stroke.  Refill your prescription before you run out.  After discharge, you should have regular check-up appointments with your healthcare provider that is prescribing your Xarelto.  In the future your dose may need to be changed if your kidney  function or weight changes by a significant amount.  What do you do if you miss a dose? If you are taking Xarelto ONCE DAILY and you miss a dose, take it as soon as you remember on the same day then continue your regularly scheduled once daily regimen the next day. Do not take two doses of Xarelto at the same time or on the same day.   Important Safety Information A possible side effect of Xarelto is bleeding. You should call your healthcare provider right away if you experience any of the following: ? Bleeding from an injury or your nose that does not stop. ? Unusual colored urine (red or dark brown) or unusual colored stools (red or black). ? Unusual bruising for unknown reasons. ? A serious fall or if you hit your head (even if there is no bleeding).  Some medicines may interact with Xarelto and might increase your risk of bleeding while on Xarelto. To help avoid this, consult your healthcare provider or pharmacist prior to using any new prescription or non-prescription medications, including herbals, vitamins, non-steroidal anti-inflammatory drugs (NSAIDs) and supplements.  This website has more information on Xarelto: [email or web address]

## 2015-02-10 NOTE — Progress Notes (Signed)
[place] for Heparin Indication: atrial fibrillation  Allergies  Allergen Reactions  . Ramipril     cough    Patient Measurements: Height: 5' (152.4 cm) Weight: 101 lb 3.2 oz (45.904 kg) IBW/kg (Calculated) : 45.5 Heparin Dosing Weight: 48 kg  Vital Signs: Temp: 97.4 F (36.3 C) (10/27 2117) Temp Source: Oral (10/27 2117) BP: 78/59 mmHg (10/28 0000) Pulse Rate: 90 (10/27 2102)  Labs:  Recent Labs  02/09/15 1143 02/09/15 1915 02/09/15 2145  HGB 14.5  --   --   HCT 45.0  --   --   PLT 305  --   --   LABPROT  --  15.0  --   INR  --  1.17  --   HEPARINUNFRC  --   --  0.15*  CREATININE 0.92  --   --   TROPONINI  --  0.07*  --     Estimated Creatinine Clearance: 43.8 mL/min (by C-G formula based on Cr of 0.92).   Medical History: Past Medical History  Diagnosis Date  . GERD (gastroesophageal reflux disease)     see's Dr. Collene Mares  . Gynecological examination     see's Dr. Gaetano Net   . Sebaceous cyst     on back of neck, has seen Dr. Ronnald Collum  . Osteoporosis     sees Dr. Carrolyn Meiers   . Full dentures     Medications:  Prescriptions prior to admission  Medication Sig Dispense Refill Last Dose  . lansoprazole (PREVACID) 30 MG capsule Take 1 capsule (30 mg total) by mouth daily. 90 capsule 3 02/09/2015 at Unknown time  . benzonatate (TESSALON) 200 MG capsule Take 1 capsule (200 mg total) by mouth 2 (two) times daily as needed for cough. (Patient not taking: Reported on 01/09/2015) 60 capsule 2 Not Taking   Scheduled:  . aspirin EC  81 mg Oral Daily  . feeding supplement (ENSURE ENLIVE)  237 mL Oral BID BM  . furosemide  40 mg Intravenous Once  . metoprolol tartrate  25 mg Oral BID  . pantoprazole  40 mg Oral Daily   Infusions:  . sodium chloride 10 mL/hr (02/10/15 0020)  . diltiazem (CARDIZEM) infusion Stopped (02/10/15 0009)  . heparin 850 Units/hr (02/10/15 0020)    Assessment: 64yo female with history of GERD  presents with SOB and irregular heartbeat. Pharmacy is consulted to dose heparin for atrial fibrillation. CBC wnl, BNP 1026.2, Trop 0.09, sCr 0.92. Initial heparin level is subtherapeutic.  Goal of Therapy:  Heparin level 0.3-0.7 units/ml Monitor platelets by anticoagulation protocol: Yes   Plan:  -Increase heparin to 850 units/hr -Check 6 hour level -Daily CBC, HL    Hughes Better, PharmD, BCPS Clinical Pharmacist Pager: [phone] 02/10/2015 12:21 AM

## 2015-02-10 NOTE — Progress Notes (Addendum)
[name] for Heparin Indication: atrial fibrillation  Allergies  Allergen Reactions  . Ramipril     cough    Patient Measurements: Height: 5' (152.4 cm) Weight: 101 lb 8 oz (46.04 kg) IBW/kg (Calculated) : 45.5 Heparin Dosing Weight: 48 kg  Vital Signs: Temp: 98.8 F (37.1 C) (10/28 0627) Temp Source: Oral (10/28 0627) BP: 106/90 mmHg (10/28 0727) Pulse Rate: 90 (10/27 2102)  Labs:  Recent Labs  02/09/15 1143 02/09/15 1915 02/09/15 2145 02/10/15 0126 02/10/15 0702  HGB 14.5  --   --  12.8  --   HCT 45.0  --   --  39.9  --   PLT 305  --   --  282  --   LABPROT  --  15.0  --   --   --   INR  --  1.17  --   --   --   HEPARINUNFRC  --   --  0.15*  --  0.27*  CREATININE 0.92  --   --  1.00  --   TROPONINI  --  0.07*  --  0.06* 0.06*    Estimated Creatinine Clearance: 40.3 mL/min (by C-G formula based on Cr of 1).   Medical History: Past Medical History  Diagnosis Date  . GERD (gastroesophageal reflux disease)     see's Dr. Collene Mares  . Gynecological examination     see's Dr. Gaetano Net   . Sebaceous cyst     on back of neck, has seen Dr. Ronnald Collum  . Osteoporosis     sees Dr. Carrolyn Meiers   . Full dentures     Medications:  Prescriptions prior to admission  Medication Sig Dispense Refill Last Dose  . lansoprazole (PREVACID) 30 MG capsule Take 1 capsule (30 mg total) by mouth daily. 90 capsule 3 02/09/2015 at Unknown time  . benzonatate (TESSALON) 200 MG capsule Take 1 capsule (200 mg total) by mouth 2 (two) times daily as needed for cough. (Patient not taking: Reported on 01/09/2015) 60 capsule 2 Not Taking   Scheduled:  . aspirin EC  81 mg Oral Daily  . feeding supplement (ENSURE ENLIVE)  237 mL Oral BID BM  . metoprolol tartrate  25 mg Oral BID  . pantoprazole  40 mg Oral Daily   Infusions:  . sodium chloride 10 mL/hr (02/10/15 0020)  . diltiazem (CARDIZEM) infusion Stopped (02/10/15 0009)  . heparin 850 Units/hr  (02/10/15 0020)    Assessment: 64yo female with history of GERD presents with SOB and irregular heartbeat. Pharmacy is consulted to dose heparin for atrial fibrillation. HL after last increase remains subtherapeutic at 0.27. Cards plan is to change to NOAC prior to discharge. CBC stable.  Goal of Therapy:  Heparin level 0.3-0.7 units/ml Monitor platelets by anticoagulation protocol: Yes   Plan:  Increase heparin gtt to 950 units/hr Monitor daily HL, CBC, s/s of bleed F/U transition to [place], PharmD Clinical Pharmacist Pager [phone] 02/10/2015 8:05 AM   ADDENDUM:  Pharmacy consulted to switch to Xarelto today. SCr stable, CrCl ~27ml/min. CBC stable. No s/s of bleed  Plan: Start Xarelto 15mg  PO daily Stop heparin gtt once first Xarelto dose given Monitor CBC, s/s of bleed

## 2015-02-10 NOTE — Progress Notes (Signed)
  Echocardiogram 2D Echocardiogram has been performed.  Mackenzie [name] 02/10/2015, 10:59 AM

## 2015-02-10 NOTE — Progress Notes (Signed)
Initial Nutrition Assessment  DOCUMENTATION CODES:   Not applicable  INTERVENTION:  - Continue to provide Ensure Enlive BID (each supplement contains 350 kcal and 20 gm protein).  - Provide Multi-vitamin.  - Recommend continue to monitor patient for dysphagia and consult speech as need.  - RD team will continue to monitor for needs.    NUTRITION DIAGNOSIS:   Inadequate oral intake related to acute illness as evidenced by mild depletion of muscle mass, percent weight loss.   GOAL:   Patient will meet greater than or equal to 90% of their needs   MONITOR:   PO intake, Supplement acceptance, I & O's, Labs, Weight trends  REASON FOR ASSESSMENT:   Malnutrition Screening Tool    ASSESSMENT:   64 y/o female with no prior cardiac history and no prior h/o DM, thyroid disease or CVA, admitted for new onset atrial fibrillation w/ RVR and acute CHF.  Patient alert and responsive during visit for MST. Patient reports feeling hungry; diet just advanced from NPO. Ensure Enlive ordered BID for patient, with good compliance. Patient had half-eaten applesauce in room during visit. Patient denies nausea, vomiting, GI disturbance or trouble swallowing. Per nurse, patient described a previous history of dysphagia, and they are monitoring for future speech consult as needed.   PTA, patient has had diarrhea for approximately three weeks. She has also had a decreased thirst sensation and dry mouth. She reported feeling weakness and fatigue during this time.  Patient usually cooks for herself at home and eats two meals / day. For breakfast, she eats oatmeal, toast, jelly and coffee. Dinner is usually a pre-packed convince meal - such as a lasagna meal kit. She drinks 2-3 bottles of coke throughout the day and coffee in the morning.   Encouraged patient on the importance of good PO intake. Patient drinks Ensure and has a protein shake mix at home. Discussed with patient nutrient dense / high protein  meals and fluid intake; moderate to high compliance expected.   NFPE performed, patient has mild muscle wasting - clavicle, calves, hands, temple. She has had a 4% weight loss / 1 month (non-significant).    Labs reviewed: free T4 (1.33), HBa1c (5.7)  Medications reviewed.   Diet Order:  Diet Heart Room service appropriate?: Yes; Fluid consistency:: Thin  Skin:  Reviewed, no issues  Last BM:  10/27  Height:   Ht Readings from Last 1 Encounters:  02/09/15 5' (1.524 m)    Weight:   Wt Readings from Last 1 Encounters:  02/10/15 101 lb 8 oz (46.04 kg)    Ideal Body Weight:  45.5 kg  BMI:  Body mass index is 19.82 kg/(m^2).  Estimated Nutritional Needs:   Kcal:  1200-1400 kcal  Protein:  65-75 gm  Fluid:  >/= 1.5 L/day  EDUCATION NEEDS:   Education needs addressed  Kayleen Memos, Dietetic Intern 02/10/2015 11:55 AM

## 2015-02-10 NOTE — Progress Notes (Signed)
Dr. Philbert Riser returned call with no new orders. [facility] [employer]

## 2015-02-11 ENCOUNTER — Inpatient Hospital Stay (HOSPITAL_COMMUNITY): Payer: BLUE CROSS/BLUE SHIELD

## 2015-02-11 DIAGNOSIS — I509 Heart failure, unspecified: Secondary | ICD-10-CM

## 2015-02-11 LAB — NM MYOCAR MULTI W/SPECT W/WALL MOTION / EF
Peak HR: 134 {beats}/min
Rest HR: 100 {beats}/min

## 2015-02-11 LAB — CBC
HCT: 41 % (ref 36.0–46.0)
Hemoglobin: 13.6 g/dL (ref 12.0–15.0)
MCH: 29.5 pg (ref 26.0–34.0)
MCHC: 33.2 g/dL (ref 30.0–36.0)
MCV: 88.9 fL (ref 78.0–100.0)
PLATELETS: 264 10*3/uL (ref 150–400)
RBC: 4.61 MIL/uL (ref 3.87–5.11)
RDW: 13 % (ref 11.5–15.5)
WBC: 6.4 10*3/uL (ref 4.0–10.5)

## 2015-02-11 MED ORDER — TECHNETIUM TC 99M SESTAMIBI GENERIC - CARDIOLITE
10.0000 | Freq: Once | INTRAVENOUS | Status: AC | PRN
  Administered 2015-02-11: 10 via INTRAVENOUS

## 2015-02-11 MED ORDER — REGADENOSON 0.4 MG/5ML IV SOLN
0.4000 mg | Freq: Once | INTRAVENOUS | Status: AC
  Administered 2015-02-11: 0.4 mg via INTRAVENOUS
  Filled 2015-02-11: qty 5

## 2015-02-11 MED ORDER — REGADENOSON 0.4 MG/5ML IV SOLN
INTRAVENOUS | Status: AC
  Administered 2015-02-11: 0.4 mg via INTRAVENOUS
  Filled 2015-02-11: qty 5

## 2015-02-11 MED ORDER — TECHNETIUM TC 99M SESTAMIBI - CARDIOLITE
30.0000 | Freq: Once | INTRAVENOUS | Status: AC | PRN
  Administered 2015-02-11: 10:00:00 30 via INTRAVENOUS

## 2015-02-11 NOTE — Progress Notes (Signed)
    Subjective: No complaints  Objective: Vital signs in last 24 hours: Temp:  [97.8 F (36.6 C)-98.5 F (36.9 C)] 98 F (36.7 C) (10/29 0500) Pulse Rate:  [91-104] 91 (10/29 0500) Resp:  [17] 17 (10/28 1333) BP: (94-143)/(60-91) 112/78 mmHg (10/29 1004) SpO2:  [96 %-98 %] 98 % (10/29 0500) Weight:  [99 lb 11.2 oz (45.224 kg)] 99 lb 11.2 oz (45.224 kg) (10/29 0500) Last BM Date: 02/09/15  Intake/Output from previous day: 10/28 0701 - 10/29 0700 In: 215.1 [P.O.:120; I.V.:95.1] Out: -  Intake/Output this shift:    Medications Scheduled Meds: . feeding supplement (ENSURE ENLIVE)  237 mL Oral BID BM  . metoprolol tartrate  50 mg Oral BID  . multivitamin with minerals  1 tablet Oral Daily  . pantoprazole  40 mg Oral Daily  . regadenoson      . regadenoson  0.4 mg Intravenous Once  . rivaroxaban  15 mg Oral Q supper   Continuous Infusions: . sodium chloride 10 mL/hr (02/10/15 0020)   PRN Meds:.acetaminophen, ALPRAZolam, ondansetron (ZOFRAN) IV, technetium sestamibi, zolpidem  PE: General appearance: alert, cooperative and no distress Lungs: clear to auscultation bilaterally Heart: irregularly irregular rhythm and No MRG Extremities: No LEE Pulses: 2+ and symmetric Skin: warm and dry Neurologic: Grossly normal  Lab Results:   Recent Labs  02/09/15 1143 02/10/15 0126 02/11/15 0401  WBC 7.8 7.7 6.4  HGB 14.5 12.8 13.6  HCT 45.0 39.9 41.0  PLT 305 282 264   BMET  Recent Labs  02/09/15 1143 02/10/15 0126  NA 141 136  K 3.8 3.7  CL 106 102  CO2 23 25  GLUCOSE 116* 111*  BUN 11 12  CREATININE 0.92 1.00  CALCIUM 9.6 9.0   PT/INR  Recent Labs  02/09/15 1915  LABPROT 15.0  INR 1.17   Cholesterol  Recent Labs  02/10/15 0126  CHOL 157   Cardiac Enzymes Invalid input(s): TROPONIN,  CKMB  Studies/Results: @RISRSLT2 @   Assessment/Plan    Principal Problem:   Atrial fibrillation with RVR (HCC) Active Problems:   Acute CHF (congestive  heart failure) (HCC)   Atrial fibrillation (HCC)   Acute systolic CHF (congestive heart failure) ([place])  1. Atrial Fibrillation w/ RVR: TSH, K and Mg all WNL. Afebrile. WBC normal. CXR with only mild CHF but no infiltrate. 2D Echo pending. Rate is improved but still mildly tachy and irregular, rate 100-130 in Nuc Med Continue PO metoprolol 50mg .   IV Cardizem stopped because of EF 25-30%.  Continue IV heparin for now. Will need oral anticoagulation given CHA2DS2 VASc score of at least 2 (female, Age 3-74). Waiting on Lexiscan results.  Bp well controlled and my limited further increase of lopressor.   2. Acute CHF: Net fluids: +0.2L/-0.7L. No dyspnea. Appears euvolemic  3. Abnormal Troponin: flat, low-level trend. No chest pain. Suspect 2/2 demand ischemia from afib w/ RVR and acute CHF.     LOS: 2 days    HAGER, BRYAN PA-C 02/11/2015 10:06 AM  Patient seen and examined and agree with above note.  Continue intravenous heparin.  Will place on digoxin to help with rate control.  Blood pressure is somewhat soft.  Awaiting results of nuclear scan.  If no evidence of ischemia plan TEE cardioversion on Monday.  Kerry Hough MD [facility]

## 2015-02-12 LAB — BASIC METABOLIC PANEL
Anion gap: 8 (ref 5–15)
BUN: 15 mg/dL (ref 6–20)
CALCIUM: 9.4 mg/dL (ref 8.9–10.3)
CO2: 29 mmol/L (ref 22–32)
CREATININE: 0.88 mg/dL (ref 0.44–1.00)
Chloride: 102 mmol/L (ref 101–111)
GFR calc non Af Amer: >60 mL/min (ref >60)
Glucose, Bld: 115 mg/dL — ABNORMAL HIGH (ref 65–99)
Potassium: 3.9 mmol/L (ref 3.5–5.1)
SODIUM: 139 mmol/L (ref 135–145)

## 2015-02-12 LAB — CBC
HEMATOCRIT: 41.2 % (ref 36.0–46.0)
HEMOGLOBIN: 13.3 g/dL (ref 12.0–15.0)
MCH: 28.7 pg (ref 26.0–34.0)
MCHC: 32.3 g/dL (ref 30.0–36.0)
MCV: 89 fL (ref 78.0–100.0)
Platelets: 257 10*3/uL (ref 150–400)
RBC: 4.63 MIL/uL (ref 3.87–5.11)
RDW: 12.9 % (ref 11.5–15.5)
WBC: 5.9 10*3/uL (ref 4.0–10.5)

## 2015-02-12 LAB — OCCULT BLOOD X 1 CARD TO LAB, STOOL: FECAL OCCULT BLD: NEGATIVE

## 2015-02-12 MED ORDER — SODIUM CHLORIDE 0.9 % IV SOLN
INTRAVENOUS | Status: DC
  Administered 2015-02-12: 22:00:00 via INTRAVENOUS

## 2015-02-12 MED ORDER — LOSARTAN POTASSIUM 25 MG PO TABS
25.0000 mg | ORAL_TABLET | Freq: Every day | ORAL | Status: DC
  Administered 2015-02-12 – 2015-02-13 (×2): 25 mg via ORAL
  Filled 2015-02-12 (×2): qty 1

## 2015-02-12 MED ORDER — DIGOXIN 250 MCG PO TABS
0.2500 mg | ORAL_TABLET | Freq: Every day | ORAL | Status: DC
  Administered 2015-02-12 – 2015-02-13 (×2): 0.25 mg via ORAL
  Filled 2015-02-12 (×2): qty 1

## 2015-02-12 NOTE — Progress Notes (Signed)
Patient continues to have difficulties swallowing.  She had a spell while eating lunch. She complained that food was "stuck" in her throat and could not breath. She was able to clear her throat a few minutes later. O2 sats 96% RA. Will continue to monitor patient.

## 2015-02-12 NOTE — Progress Notes (Signed)
Subjective:  No complaints of chest pain today.  Had myocardial perfusion scan that showed ejection fraction of 21%, small fixed apical defect but no ischemia.  No shortness of breath.  Had sharp chest pains around the time of the Myoview.  Objective:  Vital Signs in the last 24 hours: BP 95/62 mmHg  Pulse 111  Temp(Src) 98.2 F (36.8 C) (Oral)  Resp 17  Ht 5' (1.524 m)  Wt 45.904 kg (101 lb 3.2 oz)  BMI 19.76 kg/m2  SpO2 97%  Physical Exam: Pleasant thin elderly female in no acute distress Lungs:  Clear Cardiac:  Irregular rhythm, normal S1 and S2, no S3 Abdomen:  Soft, nontender, no masses Extremities:  No edema present  Intake/Output from previous day: 10/29 0701 - 10/30 0700 In: -  Out: 1400 [Urine:1400]  Weight Filed Weights   02/10/15 0627 02/11/15 0500 02/12/15 0500  Weight: 46.04 kg (101 lb 8 oz) 45.224 kg (99 lb 11.2 oz) 45.904 kg (101 lb 3.2 oz)    Lab Results: Basic Metabolic Panel:  Recent Labs  02/09/15 1143 02/10/15 0126  NA 141 136  K 3.8 3.7  CL 106 102  CO2 23 25  GLUCOSE 116* 111*  BUN 11 12  CREATININE 0.92 1.00   CBC:  Recent Labs  02/11/15 0401 02/12/15 0453  WBC 6.4 5.9  HGB 13.6 13.3  HCT 41.0 41.2  MCV 88.9 89.0  PLT 264 257   Cardiac Enzymes: Troponin (Point of Care Test)  Recent Labs  02/09/15 1154  TROPIPOC 0.09*   Cardiac Panel (last 3 results)  Recent Labs  02/09/15 1915 02/10/15 0126 02/10/15 0702  TROPONINI 0.07* 0.06* 0.06*    Telemetry: Atrial fibrillation rate in the 90s  Assessment/Plan:  1.  Cardiomyopathy with no ischemia noted on myocardial perfusion scan possibly rate related 2.  Atrial fibrillation with somewhat rapid response better controlled today  Recommendations:  Plan TEE cardioversion tomorrow since Myoview was nonischemic.  Risks of both TEE as well as cardioversion discussed with patient.  She is agreeable and wanted to proceed.  Keep nothing by mouth after midnight.   Kerry Hough  MD [facility]  02/12/2015, 8:34 AM

## 2015-02-13 ENCOUNTER — Inpatient Hospital Stay (HOSPITAL_COMMUNITY): Payer: BLUE CROSS/BLUE SHIELD | Admitting: Anesthesiology

## 2015-02-13 ENCOUNTER — Encounter (HOSPITAL_COMMUNITY)
Admission: EM | Disposition: A | Discharge status: Home / Self Care | Payer: Self-pay | Source: Home / Self Care | Attending: Cardiology

## 2015-02-13 ENCOUNTER — Inpatient Hospital Stay (HOSPITAL_COMMUNITY): Payer: BLUE CROSS/BLUE SHIELD

## 2015-02-13 ENCOUNTER — Encounter (HOSPITAL_COMMUNITY): Payer: Self-pay | Admitting: Physician Assistant

## 2015-02-13 DIAGNOSIS — R7989 Other specified abnormal findings of blood chemistry: Secondary | ICD-10-CM

## 2015-02-13 DIAGNOSIS — K219 Gastro-esophageal reflux disease without esophagitis: Secondary | ICD-10-CM | POA: Diagnosis present

## 2015-02-13 DIAGNOSIS — I340 Nonrheumatic mitral (valve) insufficiency: Secondary | ICD-10-CM

## 2015-02-13 DIAGNOSIS — I5022 Chronic systolic (congestive) heart failure: Secondary | ICD-10-CM | POA: Diagnosis present

## 2015-02-13 DIAGNOSIS — I248 Other forms of acute ischemic heart disease: Secondary | ICD-10-CM

## 2015-02-13 DIAGNOSIS — K222 Esophageal obstruction: Secondary | ICD-10-CM

## 2015-02-13 DIAGNOSIS — I4891 Unspecified atrial fibrillation: Principal | ICD-10-CM

## 2015-02-13 DIAGNOSIS — I5023 Acute on chronic systolic (congestive) heart failure: Secondary | ICD-10-CM

## 2015-02-13 DIAGNOSIS — I480 Paroxysmal atrial fibrillation: Secondary | ICD-10-CM | POA: Diagnosis present

## 2015-02-13 HISTORY — PX: CARDIOVERSION: SHX1299

## 2015-02-13 HISTORY — PX: TEE WITHOUT CARDIOVERSION: SHX5443

## 2015-02-13 LAB — OCCULT BLOOD X 1 CARD TO LAB, STOOL: FECAL OCCULT BLD: NEGATIVE

## 2015-02-13 SURGERY — ECHOCARDIOGRAM, TRANSESOPHAGEAL
Anesthesia: Monitor Anesthesia Care

## 2015-02-13 MED ORDER — METOPROLOL TARTRATE 50 MG PO TABS: 50.0000 mg | ORAL_TABLET | Freq: Two times a day (BID) | ORAL | Status: DC

## 2015-02-13 MED ORDER — RIVAROXABAN 20 MG PO TABS
20.0000 mg | ORAL_TABLET | Freq: Every day | ORAL | Status: DC
  Administered 2015-02-13: 20 mg via ORAL

## 2015-02-13 MED ORDER — LOSARTAN POTASSIUM 25 MG PO TABS: 25.0000 mg | ORAL_TABLET | Freq: Every day | ORAL | Status: DC

## 2015-02-13 MED ORDER — BUTAMBEN-TETRACAINE-BENZOCAINE 2-2-14 % EX AERO
INHALATION_SPRAY | CUTANEOUS | Status: DC | PRN
  Administered 2015-02-13 (×2): 1 via TOPICAL

## 2015-02-13 MED ORDER — RIVAROXABAN 20 MG PO TABS: 20.0000 mg | ORAL_TABLET | Freq: Every day | ORAL | Status: DC

## 2015-02-13 MED ORDER — LIDOCAINE HCL (CARDIAC) 20 MG/ML IV SOLN
INTRAVENOUS | Status: DC | PRN
  Administered 2015-02-13: 50 mg via INTRAVENOUS

## 2015-02-13 MED ORDER — LIDOCAINE VISCOUS 2 % MT SOLN
OROMUCOSAL | Status: AC
  Filled 2015-02-13: qty 15

## 2015-02-13 MED ORDER — OFF THE BEAT BOOK
Freq: Once | Status: AC
  Administered 2015-02-13: 17:00:00
  Filled 2015-02-13: qty 1

## 2015-02-13 MED ORDER — SODIUM CHLORIDE 0.9 % IV SOLN
INTRAVENOUS | Status: DC
  Administered 2015-02-13 (×2): via INTRAVENOUS

## 2015-02-13 MED ORDER — FUROSEMIDE 20 MG PO TABS: 20.0000 mg | ORAL_TABLET | Freq: Every day | ORAL | Status: DC

## 2015-02-13 MED ORDER — PROPOFOL 500 MG/50ML IV EMUL
INTRAVENOUS | Status: DC | PRN
  Administered 2015-02-13: 50 ug/kg/min via INTRAVENOUS

## 2015-02-13 MED ORDER — PROPOFOL 10 MG/ML IV BOLUS
INTRAVENOUS | Status: DC | PRN
  Administered 2015-02-13: 20 mg via INTRAVENOUS
  Administered 2015-02-13: 2 mg via INTRAVENOUS

## 2015-02-13 MED ORDER — PHENYLEPHRINE HCL 10 MG/ML IJ SOLN
INTRAMUSCULAR | Status: DC | PRN
  Administered 2015-02-13 (×2): 40 ug via INTRAVENOUS

## 2015-02-13 NOTE — Progress Notes (Signed)
Echocardiogram Echocardiogram Transesophageal has been performed.  Mackenzie [name] 02/13/2015, 2:07 PM

## 2015-02-13 NOTE — Anesthesia Preprocedure Evaluation (Addendum)
Anesthesia Evaluation  Patient identified by MRN, date of birth, ID band Patient awake    Reviewed: Allergy & Precautions, H&P , NPO status , Patient's Chart, lab work & pertinent test results  History of Anesthesia Complications Negative for: history of anesthetic complications  Airway Mallampati: I  TM Distance: >3 FB Neck ROM: Full    Dental  (+) Edentulous Upper, Edentulous Lower, Dental Advisory Given   Pulmonary neg pulmonary ROS,    Pulmonary exam normal        Cardiovascular +CHF  Normal cardiovascular exam  Echo 02/10/2015 - Left ventricle: The cavity size was normal. Systolic function wasseverely reduced. The estimated ejection fraction was in therange of 25% to 30%. Diffuse hypokinesis. The study was nottechnically sufficient to allow evaluation of LV diastolicdysfunction due to atrial fibrillation. - Aortic valve: Trileaflet; normal thickness, mildly calcified leaflets. - Mitral valve: There was mild regurgitation. - Left atrium: The atrium was mildly dilated. - Pericardium, extracardiac: A trivial pericardial effusion wasidentified posterior to the heart. There was a small left pleuraleffusion.     Neuro/Psych negative neurological ROS  negative psych ROS   GI/Hepatic Neg liver ROS, GERD  ,  Endo/Other  negative endocrine ROS  Renal/GU negative Renal ROS     Musculoskeletal   Abdominal   Peds  Hematology negative hematology ROS (+)   Anesthesia Other Findings   Reproductive/Obstetrics                           Anesthesia Physical  Anesthesia Plan  ASA: III  Anesthesia Plan: MAC   Post-op Pain Management:    Induction: Intravenous  Airway Management Planned:   Additional Equipment:   Intra-op Plan:   Post-operative Plan:   Informed Consent: I have reviewed the patients History and Physical, chart, labs and discussed the procedure including the risks,  benefits and alternatives for the proposed anesthesia with the patient or authorized representative who has indicated his/her understanding and acceptance.   Dental advisory given  Plan Discussed with: CRNA  Anesthesia Plan Comments:        Anesthesia Quick Evaluation

## 2015-02-13 NOTE — Discharge Summary (Signed)
Discharge Summary   Patient ID: Mackenzie [name] MRN: [id number], DOB/AGE: [birth year] 65 y.o. Admit date: 02/09/2015 D/C date:     02/13/2015  Primary Cardiologist:  Dr. Martinique  Principal Problem:   Atrial fibrillation with RVR ([place])   Acute systolic CHF (congestive heart failure) ([place])   Demand ischemia ([place]) Active Problems:   GERD (gastroesophageal reflux disease)   Esophageal stricture   PAF (paroxysmal atrial fibrillation) ([facility])   Chronic systolic CHF (congestive heart failure) ([place])    Admission Dates: 02/09/15-02/13/15 Discharge Diagnosis: New onset atrial fibrillation with RVR s/p successful TEE/DCCV and new onset systolic CHF (felt to be tachy mediated)  HPI: Mackenzie [name] is a 64 y.o. female with a history of HTN, GERD, and esophageal stricture with previous dilations who presented to [facility] on 02/09/15 with SOB and palpitations. She was found to have new onset atrial fibrillation w/ RVR/ evidence of CHF and mildly elevated troponin.   She was seen by her PCP on 02/09/15 for shortness of breath and palpitations and sent to the ED for further evaluation. She was admitted for afib with RVR (HR 150s). Her BNP was noted to be elevated at 1026 and CXR with pulmonary edema and pleural effusion. She was started on IV heparin and dilt gtt which was later converted to Xarelto 15mg  qd, lopressor 50mg  BID and digoxin 0.25 mcg qd. Her TSH, K and mag were normal. A 2D ECHO was ordered which revealed severe LV dysfunction with EF 25-30%. Mild MR and mild LAE. She was set up for nuclear stress test to rule out ischemia. She underwent NST on 02/11/15 which was negative for ischemia. EF 21%. He severe LV dysfunction was felt to be due to tachycardia and restoration of NSR important. She underwent sucessful TEE/DCCV on 02/13/15 and digoxin was discontinued. Xarelto changed to 20mg  at discharge. Of note her TSH was normal but T4 mildly elevated which was felt to be due to sick euthyroid and it was  recommended to recheck TSH/ T4 in 3-4 weeks.  Hospital Course  New onset atrial fibrillation with RVR-  -- CHAD-vasc score of 3. Placed on Xarelto 15mg  qd. I am not sure why she is on 15mg . She should be on Xarelto 20mg  for thromboembolic prophylaxis (her GFR is >60, creat 0.88). She will be discharged on 20mg  Xarelto -- She was placed on Lopressor 50mg  BID and digoxin 0.25mg  qd and set up for TEE/DCCV on 02/13/15 which was successful and she remains in NSR. We will discontinue Digoxin at discharge.   Acute systolic CHF- EF 40-98%, with diffuse HK- felt to be tachycardia mediated.  -- BNP elevated to 1026. CXR with pulmonary edema and pleural effusion. -- Given 2 doses of IV lasix 40mg . Net neg 3.165L. She has no further s/s CHF -- Continue losartan 25mg  qd and Lopressor 50mg  BID. We will send her home on lasix 20mg  po qd for maintenance dosing. -- Repeat 2D ECHO in 3 months to re-assess LV function ( end of January)  Elevated troponin- mild and flat. 0.07--> 0.06--> 0.06. She underwent nuclear stress test which was negative for ischemia. EF 21%.   GERD- continue PPI  Normal TSH/T4 mildly elevated- likely sick euthyroid- recheck TSH/ T4 in 3-4 weeks    The patient has had an uncomplicated hospital course and is recovering well. She has been seen by Dr. Sallyanne Kuster today and deemed ready for discharge home. All follow-up appointments have been scheduled. Smoking cessation was disscussed in length. A written JX  for a 30 day free supply of Brilinta was provided for the patient. A work excuse note was provided as well. Discharge medications are listed below.   Discharge Vitals: Blood pressure 97/47, pulse 51, temperature 97.9 F (36.6 C), temperature source Oral, resp. rate 16, height 5' (1.524 m), weight 100 lb 12.8 oz (45.723 kg), SpO2 97 %.  Labs: Lab Results  Component Value Date   WBC 5.9 02/12/2015   HGB 13.3 02/12/2015   HCT 41.2 02/12/2015   MCV 89.0 02/12/2015   PLT 257  02/12/2015     Recent Labs Lab 02/12/15 1119  NA 139  K 3.9  CL 102  CO2 29  BUN 15  CREATININE 0.88  CALCIUM 9.4  GLUCOSE 115*   No results for input(s): CKTOTAL, CKMB, TROPONINI in the last 72 hours. Lab Results  Component Value Date   CHOL 157 02/10/2015   HDL 53 02/10/2015   LDLCALC 92 02/10/2015   TRIG 61 02/10/2015     Diagnostic Studies/Procedures   Nm Myocar Multi W/spect W/wall Motion / Ef  02/11/2015  CLINICAL DATA:  Congestive heart failure EXAM: MYOCARDIAL IMAGING WITH SPECT (REST AND PHARMACOLOGIC-STRESS) GATED LEFT VENTRICULAR WALL MOTION STUDY LEFT VENTRICULAR EJECTION FRACTION TECHNIQUE: Standard myocardial SPECT imaging was performed after resting intravenous injection of 10 mCi Tc-24m sestamibi. Subsequently, intravenous infusion of Lexiscan was performed under the supervision of the Cardiology staff. At peak effect of the drug, 30 mCi Tc-87m sestamibi was injected intravenously and standard myocardial SPECT imaging was performed. Quantitative gated imaging was also performed to evaluate left ventricular wall motion, and estimate left ventricular ejection fraction. COMPARISON:  None. FINDINGS: Perfusion: No decreased activity in the left ventricle on stress imaging to suggest reversible ischemia. Small, mild fixed defect along the anterior apex. Wall Motion: Global hypokinesis.  No left ventricular dilation. Left Ventricular Ejection Fraction: 21 % End diastolic volume 85 ml End systolic volume 67 ml IMPRESSION: 1. No reversible ischemia or infarction. Small, mild fixed defect along the anterior apex. 2. Global hypokinesis. 3.  Decreased left ventricular ejection fraction of 21%. 4. High-risk stress test findings*. *2012 Appropriate Use Criteria for Coronary Revascularization Focused Update: J Am Coll Cardiol. 9390;30(0):923-300. http://content.airportbarriers.com.aspx?articleid=1201161 These results will be called to the ordering clinician or representative by the  Radiologist Assistant, and communication documented in the PACS or zVision Dashboard. Electronically Signed   By: Julian Hy M.D.   On: 02/11/2015 14:19   Dg Chest Portable 1 View  02/09/2015  CLINICAL DATA:  Cough and shortness of breath EXAM: PORTABLE CHEST 1 VIEW COMPARISON:  Thoracic spine study 06/17/2007 FINDINGS: Cardiopericardial enlargement which is mild. Negative aortic contours when accounting for rotation. Small pleural effusions with interstitial coarsening and cephalized blood flow. No focal consolidation. S shape scoliosis. IMPRESSION: Pulmonary venous congestion with trace effusions, likely mild CHF. Electronically Signed   By: Monte Fantasia M.D.   On: 02/09/2015 12:20    2D ECHO: 02/10/2015 LV EF: 25% -  30% Study Conclusions - Left ventricle: The cavity size was normal. Systolic function was severely reduced. The estimated ejection fraction was in the range of 25% to 30%. Diffuse hypokinesis. The study was not technically sufficient to allow evaluation of LV diastolic dysfunction due to atrial fibrillation. - Aortic valve: Trileaflet; normal thickness, mildly calcified leaflets. - Mitral valve: There was mild regurgitation. - Left atrium: The atrium was mildly dilated. - Pericardium, extracardiac: A trivial pericardial effusion was identified posterior to the heart. There was a small left pleural effusion.  Discharge Medications     Medication List    TAKE these medications        benzonatate 200 MG capsule  Commonly known as:  TESSALON  Take 1 capsule (200 mg total) by mouth 2 (two) times daily as needed for cough.     furosemide 20 MG tablet  Commonly known as:  LASIX  Take 1 tablet (20 mg total) by mouth daily.     lansoprazole 30 MG capsule  Commonly known as:  PREVACID  Take 1 capsule (30 mg total) by mouth daily.     losartan 25 MG tablet  Commonly known as:  COZAAR  Take 1 tablet (25 mg total) by mouth daily.      metoprolol 50 MG tablet  Commonly known as:  LOPRESSOR  Take 1 tablet (50 mg total) by mouth 2 (two) times daily.     rivaroxaban 20 MG Tabs tablet  Commonly known as:  XARELTO  Take 1 tablet (20 mg total) by mouth daily with supper.        Disposition   The patient will be discharged in stable condition to home.  Follow-up Information    Follow up with Eileen Stanford, PA-C On 02/22/2015.   Specialties:  Cardiology, Radiology   Why:  @ 9:30am    Contact information:   [place] [id number] [phone]         Duration of Discharge Encounter: Greater than 30 minutes including physician and PA time.  Mable Fill R PA-C 02/13/2015, 4:29 PM

## 2015-02-13 NOTE — Anesthesia Postprocedure Evaluation (Signed)
Anesthesia Post Note  Patient: Mackenzie [name]  Procedure(s) Performed: Procedure(s) (LRB): TRANSESOPHAGEAL ECHOCARDIOGRAM (TEE) (N/A) CARDIOVERSION (N/A)  Anesthesia type: MAC  Patient location: PACU  Post pain: Pain level controlled  Post assessment: Post-op Vital signs reviewed  Last Vitals: BP 80/36 mmHg  Pulse 90  Temp(Src) 36.6 C (Oral)  Resp 16  Ht 5' (1.524 m)  Wt 100 lb 12.8 oz (45.723 kg)  BMI 19.69 kg/m2  SpO2 96%  Post vital signs: Reviewed  Level of consciousness: awake  Complications: No apparent anesthesia complications

## 2015-02-13 NOTE — H&P (Signed)
     INTERVAL PROCEDURE H&P  History and Physical Interval Note:  02/13/2015 12:40 PM  Mackenzie [name] has presented today for their planned procedure. The various methods of treatment have been discussed with the patient and family. After consideration of risks, benefits and other options for treatment, the patient has consented to the procedure.  The patients' outpatient history has been reviewed, patient examined, and no change in status from most recent office note within the past 30 days. I have reviewed the patients' chart and labs and will proceed as planned. Questions were answered to the patient's satisfaction.   Pixie Casino, MD, [facility] Attending Cardiologist [place] 02/13/2015, 12:40 PM

## 2015-02-13 NOTE — CV Procedure (Signed)
TEE/CARDIOVERSION NOTE  TRANSESOPHAGEAL ECHOCARDIOGRAM (TEE):  Indictation: Atrial Fibrillation  Consent:   Informed consent was obtained prior to the procedure. The risks, benefits and alternatives for the procedure were discussed and the patient comprehended these risks.  Risks include, but are not limited to, cough, sore throat, vomiting, nausea, somnolence, esophageal and stomach trauma or perforation, bleeding, low blood pressure, aspiration, pneumonia, infection, trauma to the teeth and death.    Time Out: Verified patient identification, verified procedure, site/side was marked, verified correct patient position, special equipment/implants available, medications/allergies/relevent history reviewed, required imaging and test results available. Performed  Procedure:  After a procedural time-out, the patient was given Propofol per anesthesia for sedation.  The oropharynx was anesthetized 2 cetacaine sprays.  The transesophageal probe was inserted in the esophagus and stomach without difficulty and multiple views were obtained.  The patient was kept under observation until the patient left the procedure room.  The patient left the procedure room in stable condition.   Agitated microbubble saline contrast was administered.  Complications:    Complications: None Patient did tolerate procedure well.  Findings:  1. LEFT VENTRICLE: The left ventricular wall thickness is mildly increased.  The left ventricular cavity is dilated in size. Wall motion is globally hypokinetic.  LVEF is 30%.  2. RIGHT VENTRICLE:  The right ventricle is normal in structure and function without any thrombus or masses.    3. LEFT ATRIUM:  The left atrium is dilated in size without any thrombus or masses.  There is not spontaneous echo contrast ("smoke") in the left atrium consistent with a low flow state.  4. LEFT ATRIAL APPENDAGE:  The left atrial appendage is free of any thrombus or masses. The small  atrial appendage has single lobes. Pulse doppler indicates low flow in the appendage.  5. ATRIAL SEPTUM:  The atrial septum appears intact and is free of thrombus and/or masses.  There is no evidence for interatrial shunting by color doppler and saline microbubble.  6. RIGHT ATRIUM:  The right atrium is normal in size and function without any thrombus or masses.  7. MITRAL VALVE:  The mitral valve is normal in structure and function with Mild regurgitation.  There were no vegetations or stenosis.  8. AORTIC VALVE:  The aortic valve is trileaflet, normal in structure and function with no regurgitation.  There were no vegetations or stenosis  9. TRICUSPID VALVE:  The tricuspid valve is normal in structure and function with Mild regurgitation.  There were no vegetations or stenosis  10.  PULMONIC VALVE:  The pulmonic valve is normal in structure and function with no regurgitation.  There were no vegetations or stenosis.   11. AORTIC ARCH, ASCENDING AND DESCENDING AORTA:  There was no  Ron Parker et. Al, 1992) atherosclerosis of the ascending aorta, aortic arch, or proximal descending aorta.  12. PULMONARY VEINS: Anomalous pulmonary venous return was not noted.  13. PERICARDIUM: The pericardium appeared normal and non-thickened.  There is no pericardial effusion.  CARDIOVERSION:     Second Time Out: Verified patient identification, verified procedure, site/side was marked, verified correct patient position, special equipment/implants available, medications/allergies/relevent history reviewed, required imaging and test results available.  Performed  Procedure:  1. Patient placed on cardiac monitor, pulse oximetry, supplemental oxygen as necessary.  2. Sedation administered per anesthesia 3. Pacer pads placed anterior and posterior chest. 4. Cardioverted 1 time(s).  5. Cardioverted at 120J biphasic.  Complications:  Complications: None Patient did tolerate procedure  well.  Impression:  1. No LAA thrombus 2. Negative for PFO 3. Mild MR 4. Dilated, globally hypokinetic LV with EF 30% 5. Successful DCCV with a single 120J biphasic shock to sinus bradycardia  Recommendations:  1.  Continue anticoagulation and CHF medications. Would recommend re-assessment of LV function if she remains in sinus in 3 months by echo.  Time Spent Directly with the Patient:  45 minutes   Pixie Casino, MD, [facility] Attending Cardiologist [facility]  02/13/2015, 1:52 PM

## 2015-02-13 NOTE — Transfer of Care (Signed)
Immediate Anesthesia Transfer of Care Note  Patient: Mackenzie [name]  Procedure(s) Performed: Procedure(s): TRANSESOPHAGEAL ECHOCARDIOGRAM (TEE) (N/A) CARDIOVERSION (N/A)  Patient Location: PACU and Endoscopy Unit  Anesthesia Type:MAC  Level of Consciousness: awake  Airway & Oxygen Therapy: Patient Spontanous Breathing and Patient connected to nasal cannula oxygen  Post-op Assessment: Report given to RN, Post -op Vital signs reviewed and stable and Patient moving all extremities  Post vital signs: Reviewed and stable  Last Vitals:  Filed Vitals:   02/13/15 1347  BP:   Pulse:   Temp: 36.6 C  Resp:     Complications: No apparent anesthesia complications

## 2015-02-13 NOTE — Progress Notes (Signed)
Patient Name: Mackenzie [name] Date of Encounter: 02/13/2015     Principal Problem:   Atrial fibrillation with RVR ([place]) Active Problems:   Acute CHF (congestive heart failure) (H[facility])   Atrial fibrillation ([facility])   Acute systolic CHF (congestive heart failure) ([place])    SUBJECTIVE  No CP. Some SOB when ambulating.   CURRENT MEDS . digoxin  0.25 mg Oral Daily  . feeding supplement (ENSURE ENLIVE)  237 mL Oral BID BM  . losartan  25 mg Oral Daily  . metoprolol tartrate  50 mg Oral BID  . multivitamin with minerals  1 tablet Oral Daily  . pantoprazole  40 mg Oral Daily  . rivaroxaban  15 mg Oral Q supper    OBJECTIVE  Filed Vitals:   02/12/15 1332 02/12/15 2011 02/13/15 0427 02/13/15 0733  BP: 91/61 111/67 101/69   Pulse: 88 91 47   Temp: 97.7 F (36.5 C) 98 F (36.7 C) 98.5 F (36.9 C)   TempSrc: Oral Oral Oral   Resp: 17     Height:      Weight:    100 lb 12.8 oz (45.723 kg)  SpO2: 100% 99% 100%     Intake/Output Summary (Last 24 hours) at 02/13/15 0823 Last data filed at 02/13/15 0427  Gross per 24 hour  Intake    240 ml  Output   1300 ml  Net  -1060 ml   Filed Weights   02/11/15 0500 02/12/15 0500 02/13/15 0733  Weight: 99 lb 11.2 oz (45.224 kg) 101 lb 3.2 oz (45.904 kg) 100 lb 12.8 oz (45.723 kg)    PHYSICAL EXAM  General: Pleasant, NAD. Neuro: Alert and oriented X 3. Moves all extremities spontaneously. Psych: Normal affect. HEENT:  Normal  Neck: Supple without bruits or JVD. Lungs:  Resp regular and unlabored, CTA. Heart: irreg irreg. no s3, s4, or murmurs. Abdomen: Soft, non-tender, non-distended, BS + x 4.  Extremities: No clubbing, cyanosis or trace LE edema bilaterally. DP/PT/Radials 2+ and equal bilaterally.  Accessory Clinical Findings  CBC  Recent Labs  02/11/15 0401 02/12/15 0453  WBC 6.4 5.9  HGB 13.6 13.3  HCT 41.0 41.2  MCV 88.9 89.0  PLT 264 253   Basic Metabolic Panel  Recent Labs  02/12/15 1119  NA 139  K 3.9    CL 102  CO2 29  GLUCOSE 115*  BUN 15  CREATININE 0.88  CALCIUM 9.4    TELE  Atrial fib with CVR HR in 90s  Radiology/Studies  Nm Myocar Multi W/spect W/wall Motion / Ef  02/11/2015  CLINICAL DATA:  Congestive heart failure EXAM: MYOCARDIAL IMAGING WITH SPECT (REST AND PHARMACOLOGIC-STRESS) GATED LEFT VENTRICULAR WALL MOTION STUDY LEFT VENTRICULAR EJECTION FRACTION TECHNIQUE: Standard myocardial SPECT imaging was performed after resting intravenous injection of 10 mCi Tc-46m sestamibi. Subsequently, intravenous infusion of Lexiscan was performed under the supervision of the Cardiology staff. At peak effect of the drug, 30 mCi Tc-16m sestamibi was injected intravenously and standard myocardial SPECT imaging was performed. Quantitative gated imaging was also performed to evaluate left ventricular wall motion, and estimate left ventricular ejection fraction. COMPARISON:  None. FINDINGS: Perfusion: No decreased activity in the left ventricle on stress imaging to suggest reversible ischemia. Small, mild fixed defect along the anterior apex. Wall Motion: Global hypokinesis.  No left ventricular dilation. Left Ventricular Ejection Fraction: 21 % End diastolic volume 85 ml End systolic volume 67 ml IMPRESSION: 1. No reversible ischemia or infarction. Small, mild fixed defect along the  anterior apex. 2. Global hypokinesis. 3.  Decreased left ventricular ejection fraction of 21%. 4. High-risk stress test findings*. *2012 Appropriate Use Criteria for Coronary Revascularization Focused Update: J Am Coll Cardiol. 8502;77(4):128-786. http://content.airportbarriers.com.aspx?articleid=1201161 These results will be called to the ordering clinician or representative by the Radiologist Assistant, and communication documented in the PACS or zVision Dashboard. Electronically Signed   By: Julian Hy M.D.   On: 02/11/2015 14:19   Dg Chest Portable 1 View  02/09/2015  CLINICAL DATA:  Cough and shortness of  breath EXAM: PORTABLE CHEST 1 VIEW COMPARISON:  Thoracic spine study 06/17/2007 FINDINGS: Cardiopericardial enlargement which is mild. Negative aortic contours when accounting for rotation. Small pleural effusions with interstitial coarsening and cephalized blood flow. No focal consolidation. S shape scoliosis. IMPRESSION: Pulmonary venous congestion with trace effusions, likely mild CHF. Electronically Signed   By: Monte Fantasia M.D.   On: 02/09/2015 12:20    2D ECHO: 02/10/2015 LV EF: 25- 30% Study Conclusions - Left ventricle: The cavity size was normal. Systolic function was severely reduced. The estimated ejection fraction was in the range of 25% to 30%. Diffuse hypokinesis. The study was not technically sufficient to allow evaluation of LV diastolic dysfunction due to atrial fibrillation. - Aortic valve: Trileaflet; normal thickness, mildly calcified leaflets. - Mitral valve: There was mild regurgitation. - Left atrium: The atrium was mildly dilated. - Pericardium, extracardiac: A trivial pericardial effusion was identified posterior to the heart. There was a small left pleural effusion.   ASSESSMENT AND PLAN  Mackenzie [name] is a 64 y.o. female with a history of HTN, GERD, and esophageal stricture with previous dilations who presented to [facility] on 02/09/15 with SOB and palpitations. She was found to have new onset atrial fibrillation w/ RVR/ evidence of CHF and mildly elevated troponin.   New onset atrial fibrillation with RVR- HRs now in low 90s  -- CHAD-vasc score of 3. Placed on Xarelto 15mg  qd -- Continue Lopressor 50mg  BID and digoxin 0.25mg  qd. -- Plan for TEE/DCCV today at 1pm. Possibly D/C home today if she converts to NSR. If she does not we can still probably send her home on amiodarone.  -- I have arranged follow up with me next week in the office 02/22/15  Acute on chronic systolic CHF- EF 76-72%, with diffuse HK possible tachycardia mediated.  -- BNP  elevated to 1026. CXR with pulmonary edema and pleural effusion. -- Given 2 doses of IV lasix 40mg . Net neg 3.165L. She has some mild SOB when up walking around. Appears euvolemic. Will see how she feels in NSR s/p DCCV today. She may require a low dose of maintenance diuretics -- Continue losartan 25mg  qd and Lopressor 50mg  BID.    Elevated troponin- mild and flat. 0.07--> 0.06--> 0.06. She underwent NST which was negative for ischemia. EF 21%.   GERD- continue PPI  Normal TSH/T4 mildly elevated- likely sick euthyroid- recheck TSH/ T4 in 3-4 weeks     Signed, Eileen Stanford PA-C  Pager [phone]  I have seen and examined the patient along with Angelena Form R PA-C.  I have reviewed the chart, notes and new data.  I agree with PA's note.  Key new complaints: dyspnea resolved almost completely Key examination changes: irregular rhythm, rate controlled, no overt hypervolemia Key new findings / data: borderline elevated T4 with normal TSH (sick euthyroid?)  PLAN: TEE  And possible DCCV today. Avoid gastric views since she has had esophageal dilation (2 years ago) and crushes her  pills to swallow them (otherwise no difficulty swallowing). If DCCV successful, DC home today on beta blocker and probably stop digoxin. If DCCV fails or ERAF, DC home today on amiodarone and beta blocker and stop digoxin. Try again after 1 month amiodarone. 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

## 2015-02-14 ENCOUNTER — Encounter (HOSPITAL_COMMUNITY): Payer: Self-pay | Admitting: Internal Medicine

## 2015-02-14 ENCOUNTER — Telehealth: Payer: Self-pay | Admitting: Cardiology

## 2015-02-14 MED ORDER — METOPROLOL TARTRATE 25 MG PO TABS: 25.0000 mg | ORAL_TABLET | Freq: Two times a day (BID) | ORAL | Status: DC

## 2015-02-14 NOTE — Telephone Encounter (Signed)
D/C Phone call on 02/22/15 w/ Angelena Form at [facility] office .Marland Kitchen  Thanks

## 2015-02-14 NOTE — Telephone Encounter (Signed)
New Message    Pt calling stating that this morning her BP was 92/61 and HR 56, this afternoon her BP is 89/49 and her HR 54. Pt wants to know if she should be concerned. Please call back.

## 2015-02-14 NOTE — Telephone Encounter (Signed)
Returned call to patient no answer.LMTC. 

## 2015-02-14 NOTE — Telephone Encounter (Signed)
TOC call to patient.She stated she is feeling well.No complaints.She understands discharge instructions and taking medications as prescribed.Advised to keep post hospital appointment with Angelena Form PA 02/22/15 at 9:30 am at [facility] office.

## 2015-02-14 NOTE — Telephone Encounter (Signed)
Returned call to patient.She stated her B/P has been low today as listed below.Stated she woke up with pain in left shoulder.Stated pain went anyway during the day, pain just started again.Spoke to DOD Dr.Hilty he advised to decrease lopressor to 25 mg twice a day.Advised she can take tylenol for shoulder pain.Advised if she continues to have left shoulder pain to call back.Advised to monitor B/P and call back if low.Advised to keep post hospital appointment with Angelena Form PA 02/22/15 at 9:30 am

## 2015-02-16 ENCOUNTER — Telehealth: Payer: Self-pay | Admitting: Cardiology

## 2015-02-16 NOTE — Telephone Encounter (Signed)
Pt called in stating that she was put on some new medications; furosemide,losartan,and metoprolol. She states that her HR is still low and BP is still ranging aroung 111/59. She would like to speak to the nurse about this.  Thanks

## 2015-02-16 NOTE — Telephone Encounter (Signed)
Pt reports BPs 114/63 w HR 59, 111/60 w/ HR 59.  She denies any symptoms.  Explained these are good ranges for her, should leave medications as is (had recent dose reduction of metoprolol), still may have further changes and to continue to monitor BP, HR, alert of any concerns. Pt voiced understanding of instructions and voiced thanks for the call. Will call again if needed and o/w will see Kathlene November on 11/9.

## 2015-02-20 NOTE — Progress Notes (Signed)
Cardiology Office Note   Date:  02/22/2015   ID:  Mackenzie [name], DOB May 14, 1950, MRN [id number]  PCP:  Laurey Morale, MD  Cardiologist: Dr. Martinique   Post hospital follow up    History of Present Illness: Mackenzie [name] is a 64 y.o. 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  She presented to [facility] on 02/09/15 with SOB and palpitations. She was found to have new onset atrial fibrillation w/ RVR/ evidence of CHF and mildly elevated troponin.   She was seen by her PCP on 02/09/15 for shortness of breath and palpitations and sent to the ED for further evaluation. She was admitted for afib with RVR (HR 150s). Her BNP was noted to be elevated at 1026 and CXR with pulmonary edema and pleural effusion. She was started on IV heparin and dilt gtt which was later converted to Xarelto 15mg  qd, lopressor 50mg  BID and digoxin 0.25 mcg qd. Her TSH, K and mag were normal. A 2D ECHO was ordered which revealed severe LV dysfunction with EF 25-30%. Mild MR and mild LAE. She was set up for nuclear stress test to rule out ischemia. She underwent NST on 02/11/15 which was negative for ischemia. EF 21%. He severe LV dysfunction was felt to be due to tachycardia and restoration of NSR important. She underwent sucessful TEE/DCCV on 02/13/15 and digoxin was discontinued. Xarelto changed to 20mg  at discharge. Of note her TSH was normal but T4 mildly elevated which was felt to be due to sick euthyroid and it was recommended to recheck TSH/ T4 in 3-4 weeks.  Phone notes reveal that she called in due to hypotension and bradycardia. Her lopressor was decreased from 50mg  to 25mg  BID.   Today she presents to clinic for post hospital follow up. She is feeling well but felt her heart go out of rhythm- feels it in her neck. No CP or SOB.  Props up on pillows to sleep but no LE edema or PND. No lightheadedness or dizziness. She has had some scant blood from hemorrhoids. She will continue to monitor this. It has been going on since before starting Xarelto. Will follow with PCP.     Past Medical History  Diagnosis Date  . GERD (gastroesophageal reflux disease)   . Sebaceous cyst     on back of neck, has seen Dr. Ronnald Collum  . Osteoporosis     sees Dr. Carrolyn Meiers   . Full dentures   . Esophageal stricture     a. requiring dilations. crushes pills to take PO  . PAF (paroxysmal atrial fibrillation) ([place])     a. s/p succesfful DCCV on 02/13/15  . Chronic systolic CHF (congestive heart failure) ([place])     a. 2D ECHO on 02/10/15 w/ severe LV dysfunction with EF 25-30%. Mild MR and mild LAE. negative nuclear stress test, felt to be due to tachycardia     Past Surgical History  Procedure Laterality Date  . Esophagogastroduodenoscopy  06/01/2003    12/13  . Colonoscopy  09/24/2002  . Esophagogastroduodenoscopy (egd) with esophageal dilation    . Rigid esophagoscopy N/A 01/18/2013    Procedure: RIGID ESOPHAGOSCOPY WITH ESPHAGEAL Balloon DILATION ;  Surgeon: Melida Quitter, MD;  Location: [place];  Service: ENT;  Laterality: N/A;  . Tee without cardioversion N/A  02/13/2015    Procedure: TRANSESOPHAGEAL ECHOCARDIOGRAM (TEE);  Surgeon: Pixie Casino, MD;  Location: [facility] ENDOSCOPY;  Service: Cardiovascular;  Laterality: N/A;  . Cardioversion N/A 02/13/2015    Procedure: CARDIOVERSION;  Surgeon: Pixie Casino, MD;  Location: [facility] ENDOSCOPY;  Service: Cardiovascular;  Laterality: N/A;     Current Outpatient Prescriptions  Medication Sig Dispense Refill  . furosemide (LASIX) 20 MG tablet Take 1 tablet (20 mg total) by mouth daily. 30 tablet 3  . lansoprazole (PREVACID) 30 MG capsule Take 1 capsule (30 mg total) by mouth daily. 90 capsule 3  . losartan (COZAAR) 25 MG tablet Take 1 tablet (25 mg total) by mouth daily.  30 tablet 6  . metoprolol tartrate (LOPRESSOR) 25 MG tablet Take 1 tablet (25 mg total) by mouth 2 (two) times daily. 60 tablet 6  . rivaroxaban (XARELTO) 20 MG TABS tablet Take 1 tablet (20 mg total) by mouth daily with supper. 30 tablet 11   No current facility-administered medications for this visit.    Allergies:   Ramipril    Social History:  The patient  reports that she has never smoked. She has never used smokeless tobacco. She reports that she does not drink alcohol or use illicit drugs.   Family History:  The patient's family history includes Aneurysm in her father; Diabetes type II in her sister; Heart attack in her brother and brother; Heart attack (age of onset: 24) in her mother; Hypertension in her sister and sister.    ROS:  Please see the history of present illness.   Otherwise, review of systems are positive for {none   All other systems are reviewed and negative.    PHYSICAL EXAM: VS:  BP 116/88 mmHg  Pulse 96  Ht 5' (1.524 m)  Wt 101 lb 9.6 oz (46.085 kg)  BMI 19.84 kg/m2 , BMI Body mass index is 19.84 kg/(m^2). GEN: Well nourished, well developed, in no acute distress HEENT: normal Neck: no JVD, carotid bruits, or masses Cardiac:  irreg irreg ; no murmurs, rubs, or gallops,no edema  Respiratory:  clear to auscultation bilaterally, normal work of breathing GI: soft, nontender, nondistended, + BS MS: no deformity or atrophy Skin: warm and dry, no rash Neuro:  Strength and sensation are intact Psych: euthymic mood, full affect   EKG:  EKG is ordered today. The ekg ordered today demonstrates atrial fibrillation with HR 91. With nonspecific TW changes similar to previous   Recent Labs: 02/09/2015: B Natriuretic Peptide 1026.2*; Magnesium 1.7; TSH 2.948 02/12/2015: BUN 15; Creatinine, Ser 0.88; Hemoglobin 13.3; Platelets 257; Potassium 3.9; Sodium 139    Lipid Panel    Component Value Date/Time   CHOL 157 02/10/2015 0126   TRIG 61 02/10/2015 0126   HDL  53 02/10/2015 0126   CHOLHDL 3.0 02/10/2015 0126   VLDL 12 02/10/2015 0126   LDLCALC 92 02/10/2015 0126   LDLDIRECT 121.0 03/14/2010 0931      Wt Readings from Last 3 Encounters:  02/22/15 101 lb 9.6 oz (46.085 kg)  02/13/15 100 lb 12.8 oz (45.723 kg)  02/09/15 106 lb (48.081 kg)      Other studies Reviewed: Additional studies/ records that were reviewed today include: nuclear stress test Review of the above records demonstrates:  -- nuclear stress test (02/11/15): negative for ischemia. EF 21%.   ASSESSMENT AND PLAN:  Mackenzie [name] is a 64 y.o. 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  New onset atrial fibrillation- s/p TEE/DCCV on 02/13/15. ECG revealed afib with CVR (91) -- CHAD-vasc score of 3. Continue  Xarelto 20mg  for thromboembolic prophylaxis -- Cont Lopressor 25mg  BID  -- She converted back into atrial fibrillation with minimal sx aside from fatigue. We will start her on amiodarone 200mg  BID and follow up with me in 3 weeks. If she remains in afib at that time we will plan for DCCV  (she will not require TEE as she will have been on Xarelto ~ 1 month). If her HR gets too low on both amio and lopressor we can decrease Lopressor to 12.5 mg BID or discontinue.  Acute systolic CHF- EF 44-92%, with diffuse HK- felt to be tachycardia mediated.  -- She has no further s/s CHF. Weight stable today -- Continue losartan 25mg  qd and Lopressor 25mg  BID. Continue lasix 20mg  po qd -- Repeat 2D ECHO in 3 months to re-assess LV function ( end of January)  Elevated troponin-during recent hospitalization. This was mild and flat. 0.07--> 0.06--> 0.06 and felt to be c/w demand ischemia. She underwent nuclear stress test which was negative for ischemia. EF 21%.   GERD- continue  PPI  Normal TSH/T4 mildly elevated- likely sick euthyroid- recheck TSH/ T4 in 3-4 weeks per PCP  Esophageal stricture with previous dilations- she crushes her pills. I have doubled checked with pharmacy and it is okay to crush amiodarone.   Current medicines are reviewed at length with the patient today.  The patient has no concerns regarding medicines.  The following changes have been made: start amiodarone 200mg  BID  Labs/ tests ordered today include:  No orders of the defined types were placed in this encounter.     Disposition:   FU with me in early December   Signed, Adrin Julian R, PA-C  02/22/2015 10:23 AM    [facility] [address], [place], [place]  [address] Phone: [phone]; Fax: [phone]

## 2015-02-22 ENCOUNTER — Encounter: Payer: Self-pay | Admitting: Physician Assistant

## 2015-02-22 ENCOUNTER — Ambulatory Visit (INDEPENDENT_AMBULATORY_CARE_PROVIDER_SITE_OTHER): Payer: BLUE CROSS/BLUE SHIELD | Admitting: Physician Assistant

## 2015-02-22 VITALS — BP 116/88 | HR 96 | Ht 60.0 in | Wt 101.6 lb

## 2015-02-22 DIAGNOSIS — I4891 Unspecified atrial fibrillation: Secondary | ICD-10-CM

## 2015-02-22 DIAGNOSIS — I5021 Acute systolic (congestive) heart failure: Secondary | ICD-10-CM

## 2015-02-22 MED ORDER — AMIODARONE HCL 200 MG PO TABS
200.0000 mg | ORAL_TABLET | Freq: Two times a day (BID) | ORAL | Status: DC
Start: 2015-02-22 — End: 2015-03-20

## 2015-02-22 NOTE — Patient Instructions (Signed)
Medication Instructions: Your physician has recommended you make the following change in your medication:  1.  Start Amiodarone ( 200 mg ) twice a day, sent into pt's requested pharmacy   Labwork: -None  Testing/Procedures: -None  Follow-Up: Your physician recommends that you keep your scheduled  follow-up appointment with Nell Range, PA-C   Any Other Special Instructions Will Be Listed Below (If Applicable).     If you need a refill on your cardiac medications before your next appointment, please call your pharmacy.

## 2015-03-06 ENCOUNTER — Encounter (HOSPITAL_COMMUNITY): Payer: Self-pay | Admitting: Emergency Medicine

## 2015-03-06 ENCOUNTER — Emergency Department (HOSPITAL_COMMUNITY)
Admission: EM | Admit: 2015-03-06 | Discharge: 2015-03-06 | Disposition: A | Discharge status: Home / Self Care | Payer: BLUE CROSS/BLUE SHIELD | Attending: Emergency Medicine | Admitting: Emergency Medicine

## 2015-03-06 ENCOUNTER — Telehealth: Payer: Self-pay | Admitting: Internal Medicine

## 2015-03-06 DIAGNOSIS — I5022 Chronic systolic (congestive) heart failure: Secondary | ICD-10-CM | POA: Insufficient documentation

## 2015-03-06 DIAGNOSIS — Z7901 Long term (current) use of anticoagulants: Secondary | ICD-10-CM | POA: Diagnosis not present

## 2015-03-06 DIAGNOSIS — R319 Hematuria, unspecified: Secondary | ICD-10-CM | POA: Diagnosis not present

## 2015-03-06 DIAGNOSIS — Z8739 Personal history of other diseases of the musculoskeletal system and connective tissue: Secondary | ICD-10-CM | POA: Insufficient documentation

## 2015-03-06 DIAGNOSIS — Z872 Personal history of diseases of the skin and subcutaneous tissue: Secondary | ICD-10-CM | POA: Insufficient documentation

## 2015-03-06 DIAGNOSIS — R310 Gross hematuria: Secondary | ICD-10-CM

## 2015-03-06 DIAGNOSIS — Z79899 Other long term (current) drug therapy: Secondary | ICD-10-CM | POA: Insufficient documentation

## 2015-03-06 DIAGNOSIS — K219 Gastro-esophageal reflux disease without esophagitis: Secondary | ICD-10-CM | POA: Insufficient documentation

## 2015-03-06 LAB — CBC WITH DIFFERENTIAL/PLATELET
BASOS PCT: 0 %
Basophils Absolute: 0 10*3/uL (ref 0.0–0.1)
Eosinophils Absolute: 0.1 10*3/uL (ref 0.0–0.7)
Eosinophils Relative: 1 %
HEMATOCRIT: 40.8 % (ref 36.0–46.0)
HEMOGLOBIN: 13 g/dL (ref 12.0–15.0)
LYMPHS ABS: 1.9 10*3/uL (ref 0.7–4.0)
Lymphocytes Relative: 28 %
MCH: 28.4 pg (ref 26.0–34.0)
MCHC: 31.9 g/dL (ref 30.0–36.0)
MCV: 89.1 fL (ref 78.0–100.0)
MONOS PCT: 6 %
Monocytes Absolute: 0.4 10*3/uL (ref 0.1–1.0)
NEUTROS ABS: 4.4 10*3/uL (ref 1.7–7.7)
NEUTROS PCT: 65 %
Platelets: 217 10*3/uL (ref 150–400)
RBC: 4.58 MIL/uL (ref 3.87–5.11)
RDW: 13 % (ref 11.5–15.5)
WBC: 6.9 10*3/uL (ref 4.0–10.5)

## 2015-03-06 LAB — BASIC METABOLIC PANEL
ANION GAP: 8 (ref 5–15)
BUN: 15 mg/dL (ref 6–20)
CHLORIDE: 104 mmol/L (ref 101–111)
CO2: 27 mmol/L (ref 22–32)
CREATININE: 1.08 mg/dL — AB (ref 0.44–1.00)
Calcium: 9 mg/dL (ref 8.9–10.3)
GFR calc non Af Amer: 53 mL/min — ABNORMAL LOW (ref >60)
Glucose, Bld: 114 mg/dL — ABNORMAL HIGH (ref 65–99)
POTASSIUM: 3.6 mmol/L (ref 3.5–5.1)
Sodium: 139 mmol/L (ref 135–145)

## 2015-03-06 LAB — URINALYSIS, ROUTINE W REFLEX MICROSCOPIC
Glucose, UA: NEGATIVE mg/dL
KETONES UR: 15 mg/dL — AB
NITRITE: NEGATIVE
PH: 5.5 (ref 5.0–8.0)
Protein, ur: 30 mg/dL — AB
SPECIFIC GRAVITY, URINE: 1.022 (ref 1.005–1.030)

## 2015-03-06 LAB — URINE MICROSCOPIC-ADD ON

## 2015-03-06 NOTE — Telephone Encounter (Signed)
Patient paged the moonlighter pager with the concern that she had hematuria. Called back and talked to her. She is also complaining of flank pain associated with it. She was concerned about using xarelto since she had blood clots with gross hematuria. She was told that there is a risk of stroke but it is probably smaller than worsening of bleeding with ongoing use of xarelto at this point. She will need evaluation with CT scan +/- cystoscopy to make sure there is no kidney stone/cancer. She will go to the ED for further evaluation today.

## 2015-03-06 NOTE — Discharge Instructions (Signed)

## 2015-03-06 NOTE — ED Provider Notes (Signed)
CSN: [id number]     Arrival date & time 03/06/15  [id number] History   First MD Initiated Contact with Patient 03/06/15 [phone]     Chief Complaint  Patient presents with  . Hematuria     (Consider location/radiation/quality/duration/timing/severity/associated sxs/prior Treatment) HPI   Mackenzie [name] is a(n) 64 y.o. female who presents with cc of hematuria. She  has a past medical history of GERD (gastroesophageal reflux disease); Sebaceous cyst; Osteoporosis; Full dentures; Esophageal stricture; PAF (paroxysmal atrial fibrillation) ([place]); and Chronic systolic CHF (congestive heart failure) ([place]). The patient was had 2 episodes of bloody urine and passage of a 3 mm clot in her urine this morning. She denies trauma to the vagina, she denies vaginal bleeding. Patient also denies symptoms of urinary tract infection such as frequency, urgency, suprapubic pain, back pain. She does endorse some mild left flank pain which she attributes to hanging Christmas decorations and has been ongoing for 2 days. The patient states that she was recently started on Xarelto after an episode of atrial fibrillation, on 02/13/2015. She denies feelings of symptomatic anemia. She denies racing heart, chest pain, shortness of breath. Past Medical History  Diagnosis Date  . GERD (gastroesophageal reflux disease)   . Sebaceous cyst     on back of neck, has seen Dr. Ronnald Collum  . Osteoporosis     sees Dr. Carrolyn Meiers   . Full dentures   . Esophageal stricture     a. requiring dilations. crushes pills to take PO  . PAF (paroxysmal atrial fibrillation) ([place])     a. s/p succesfful DCCV on 02/13/15  . Chronic systolic CHF (congestive heart failure) ([place])     a. 2D ECHO on 02/10/15 w/ severe LV dysfunction with EF 25-30%. Mild MR and mild LAE. negative nuclear stress test, felt to be due to tachycardia    Past Surgical History  Procedure Laterality Date  . Esophagogastroduodenoscopy  06/01/2003    12/13  . Colonoscopy   09/24/2002  . Esophagogastroduodenoscopy (egd) with esophageal dilation    . Rigid esophagoscopy N/A 01/18/2013    Procedure: RIGID ESOPHAGOSCOPY WITH ESPHAGEAL Balloon DILATION ;  Surgeon: Melida Quitter, MD;  Location: [place];  Service: ENT;  Laterality: N/A;  . Tee without cardioversion N/A 02/13/2015    Procedure: TRANSESOPHAGEAL ECHOCARDIOGRAM (TEE);  Surgeon: Pixie Casino, MD;  Location: [facility] ENDOSCOPY;  Service: Cardiovascular;  Laterality: N/A;  . Cardioversion N/A 02/13/2015    Procedure: CARDIOVERSION;  Surgeon: Pixie Casino, MD;  Location: [facility] ENDOSCOPY;  Service: Cardiovascular;  Laterality: N/A;   Family History  Problem Relation Age of Onset  . Heart attack Mother 39    died from massive heart attack  . Aneurysm Father     cerebral  . Hypertension Sister   . Diabetes type II Sister   . Heart attack Brother   . Hypertension Sister   . Heart attack Brother    Social History  Substance Use Topics  . Smoking status: Never Smoker   . Smokeless tobacco: Never Used  . Alcohol Use: No   OB History    No data available     Review of Systems Ten systems reviewed and are negative for acute change, except as noted in the HPI.    Allergies  Ramipril  Home Medications   Prior to Admission medications   Medication Sig Start Date End Date Taking? Authorizing Provider  amiodarone (PACERONE) 200 MG tablet Take 1 tablet (200 mg total) by mouth  2 (two) times daily. 02/22/15  Yes Eileen Stanford, PA-C  furosemide (LASIX) 20 MG tablet Take 1 tablet (20 mg total) by mouth daily. 02/13/15  Yes Eileen Stanford, PA-C  lansoprazole (PREVACID) 30 MG capsule Take 1 capsule (30 mg total) by mouth daily. 02/09/15  Yes Laurey Morale, MD  losartan (COZAAR) 25 MG tablet Take 1 tablet (25 mg total) by mouth daily. 02/13/15  Yes Eileen Stanford, PA-C  metoprolol tartrate (LOPRESSOR) 25 MG tablet Take 1 tablet (25 mg total) by mouth 2 (two) times daily. 02/14/15  Yes  Pixie Casino, MD  rivaroxaban (XARELTO) 20 MG TABS tablet Take 1 tablet (20 mg total) by mouth daily with supper. 02/13/15  Yes Eileen Stanford, PA-C   BP 164/91 mmHg  Pulse 66  Temp(Src) 98.6 F (37 C) (Oral)  Resp 16  Wt 101 lb (45.813 kg)  SpO2 100% Physical Exam Nursing note and vitals reviewed. Constitutional: She is oriented to person, place, and time. She appears well-developed and well-nourished. No distress.  HENT:  Head: Normocephalic and atraumatic.  Eyes: Conjunctivae normal and EOM are normal. Pupils are equal, round, and reactive to light. No scleral icterus.  Neck: Normal range of motion.  Cardiovascular: Normal rate, regular rhythm and normal heart sounds.  Exam reveals no gallop and no friction rub.   No murmur heard. Pulmonary/Chest: Effort normal and breath sounds normal. No respiratory distress.  Abdominal: Soft. Bowel sounds are normal. She exhibits no distension and no mass. There is no tenderness. There is no guarding.  Neurological: She is alert and oriented to person, place, and time.  Skin: Skin is warm and dry. She is not diaphoretic.    ED Course  Procedures (including critical care time) Labs Review Labs Reviewed  URINALYSIS, ROUTINE W REFLEX MICROSCOPIC (NOT AT [facility]) - Abnormal; Notable for the following:    Color, Urine BROWN (*)    APPearance TURBID (*)    Hgb urine dipstick LARGE (*)    Bilirubin Urine MODERATE (*)    Ketones, ur 15 (*)    Protein, ur 30 (*)    Leukocytes, UA SMALL (*)    All other components within normal limits  BASIC METABOLIC PANEL - Abnormal; Notable for the following:    Glucose, Bld 114 (*)    Creatinine, Ser 1.08 (*)    GFR calc non Af Amer 53 (*)    All other components within normal limits  URINE MICROSCOPIC-ADD ON - Abnormal; Notable for the following:    Squamous Epithelial / LPF 0-5 (*)    Bacteria, UA RARE (*)    All other components within normal limits  CBC WITH DIFFERENTIAL/PLATELET    Imaging  Review No results found. I have personally reviewed and evaluated these images and lab results as part of my medical decision-making.   EKG Interpretation None      MDM   Final diagnoses:  Hematuria, gross  Anticoagulated    9:06 AM BP 164/91 mmHg  Pulse 66  Temp(Src) 98.6 F (37 C) (Oral)  Resp 16  Wt 101 lb (45.813 kg)  SpO2 100% Awaiting patient urine sample. We will evaluate for urinary tract infection. She denies personal history of previous UTIs. Basic labs including CBC and basic metabolic panel are pending.   9:06 AM BP 164/91 mmHg  Pulse 66  Temp(Src) 98.6 F (37 C) (Oral)  Resp 16  Wt 101 lb (45.813 kg)  SpO2 100% Patient with slight  Dehydration. Advised PO  fluids HGB WNL< UA shows hematuria without signs of infection. There is some contamination No signs of infection,' This is likely anticoagulant induced hematuria. Pt is to follow up with pcp in 1-2 days Discussed reasons to seek immediate medical care in the ED including si/sx of UTI/ Pyelo and si/sx of symptomatic anemia.    Margarita Mail, PA-C 03/06/15 [id number]  Leo Grosser, MD 03/07/15 [phone]

## 2015-03-06 NOTE — ED Notes (Signed)
Pt left room after talking with Abigail, PA-- not AMA, but did not wait for instructions

## 2015-03-06 NOTE — ED Notes (Signed)
Had 2 episodes of hematuria this am. Slight pink tinge to urine. Left flank pain started Saturday, started on xeralto 2-3 weeks ago for afib.

## 2015-03-19 NOTE — Progress Notes (Signed)
Cardiology Office Note   Date:  03/20/2015   ID:  Sharley, Bohner [birth year], MRN [id number]  PCP:  Laurey Morale, MD  Cardiologist:  Dr. Martinique    PAF- office follow up    History of Present Illness: Mackenzie [name] is a 64 y.o. 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  She presented to [facility] on 02/09/15 with SOB and palpitations. She was found to have new onset atrial fibrillation w/ RVR w/ evidence of CHF and mildly elevated troponin.    She was seen by her PCP on 02/09/15 for shortness of breath and palpitations and sent to the [facility] ED for further evaluation. She was admitted for afib with RVR (HR 150s). Her BNP was noted to be elevated at 1026 and CXR with pulmonary edema and pleural effusion. She was started on IV heparin and dilt gtt which was later converted to Xarelto 15mg  qd, lopressor 50mg  BID and digoxin 0.25 mcg qd. Her TSH, K and mag were normal. A 2D ECHO was ordered which revealed severe LV dysfunction with EF 25-30%. Mild MR and mild LAE. She was set up for nuclear stress test to rule out ischemia. She underwent NST on 02/11/15 which was negative for ischemia. EF 21%. Her severe LV dysfunction was felt to be due to tachycardia and restoration of NSR important. She underwent successful TEE/DCCV on 02/13/15 and digoxin was discontinued. Xarelto changed to 20mg  at discharge. Of note her TSH was normal but T4 mildly elevated which was felt to be due to sick euthyroid and it was recommended to recheck TSH/ T4 in 3-4 weeks.  Phone notes reveal that she called in due to hypotension and bradycardia. Her Lopressor was decreased from 50mg  to 25mg  BID.   She was seen in clinic by me on 02/22/15 for post hospital follow up. She was noted to be in recurrent atrial fibrillation w/ CVR with minimal sx aside from  fatigue. She was started on amiodarone 200mg  BID and scheduled for follow up with me in 3 weeks with plans for DCCV if she remained in afib.  She was seen in the ED on 03/06/15 for painless hematuria. H/H stable at 13/40.8. She was sent home and has had no issues since.   Today she presents for follow up. ECG shows NSR. No CP or SOB. No LE edema, orthopnea or PND. No episodes of presyncope and syncope. Feeling quite well.    Past Medical History  Diagnosis Date  . GERD (gastroesophageal reflux disease)   . Sebaceous cyst     on back of neck, has seen Dr. Ronnald Collum  . Osteoporosis     sees Dr. Carrolyn Meiers   . Full dentures   . Esophageal stricture     a. requiring dilations. crushes pills to take PO  . PAF (paroxysmal atrial fibrillation) ([place])     a. s/p succesfful DCCV on 02/13/15  . Chronic systolic CHF (congestive heart failure) ([place])     a. 2D ECHO on 02/10/15 w/ severe LV dysfunction with EF 25-30%. Mild MR and mild LAE. negative nuclear stress test, felt to be due to tachycardia     Past Surgical History  Procedure Laterality Date  . Esophagogastroduodenoscopy  06/01/2003    12/13  . Colonoscopy  09/24/2002  . Esophagogastroduodenoscopy (egd) with esophageal dilation    .  Rigid esophagoscopy N/A 01/18/2013    Procedure: RIGID ESOPHAGOSCOPY WITH ESPHAGEAL Balloon DILATION ;  Surgeon: Melida Quitter, MD;  Location: [place];  Service: ENT;  Laterality: N/A;  . Tee without cardioversion N/A 02/13/2015    Procedure: TRANSESOPHAGEAL ECHOCARDIOGRAM (TEE);  Surgeon: Pixie Casino, MD;  Location: [facility] ENDOSCOPY;  Service: Cardiovascular;  Laterality: N/A;  . Cardioversion N/A 02/13/2015    Procedure: CARDIOVERSION;  Surgeon: Pixie Casino, MD;  Location: [facility] ENDOSCOPY;  Service: Cardiovascular;  Laterality: N/A;     Current Outpatient Prescriptions  Medication Sig Dispense Refill  . amiodarone (PACERONE) 200 MG tablet Take 1 tablet (200 mg total) by mouth daily.  30 tablet 6  . furosemide (LASIX) 20 MG tablet Take 1 tablet (20 mg total) by mouth daily. 30 tablet 3  . lansoprazole (PREVACID) 30 MG capsule Take 1 capsule (30 mg total) by mouth daily. 90 capsule 3  . losartan (COZAAR) 25 MG tablet Take 1 tablet (25 mg total) by mouth daily. 30 tablet 6  . rivaroxaban (XARELTO) 20 MG TABS tablet Take 1 tablet (20 mg total) by mouth daily with supper. 30 tablet 11  . metoprolol succinate (TOPROL XL) 25 MG 24 hr tablet Take 1 tablet (25 mg total) by mouth daily. 90 tablet 3   No current facility-administered medications for this visit.    Allergies:   Ramipril    Social History:  The patient  reports that she has never smoked. She has never used smokeless tobacco. She reports that she does not drink alcohol or use illicit drugs.   Family History:  The patient's family history includes Aneurysm in her father; Diabetes type II in her sister; Heart attack in her brother and brother; Heart attack (age of onset: 36) in her mother; Hypertension in her sister and sister. There is no history of Stroke.    ROS:  Please see the history of present illness.   Otherwise, review of systems are positive for NONE.   All other systems are reviewed and negative.    PHYSICAL EXAM: VS:  BP 155/90 mmHg  Pulse 48  Ht 5' (1.524 m)  Wt 102 lb (46.267 kg)  BMI 19.92 kg/m2 , BMI Body mass index is 19.92 kg/(m^2). GEN: Well nourished, well developed, in no acute distress HEENT: normal Neck: no JVD, carotid bruits, or masses Cardiac: RRR; no murmurs, rubs, or gallops,no edema  Respiratory:  clear to auscultation bilaterally, normal work of breathing GI: soft, nontender, nondistended, + BS MS: no deformity or atrophy Skin: warm and dry, no rash Neuro:  Strength and sensation are intact Psych: euthymic mood, full affect   EKG:  EKG is ordered today. The ekg ordered today demonstrates NSR HR 48   Recent Labs: 02/09/2015: B Natriuretic Peptide 1026.2*; Magnesium 1.7;  TSH 2.948 03/06/2015: BUN 15; Creatinine, Ser 1.08*; Hemoglobin 13.0; Platelets 217; Potassium 3.6; Sodium 139    Lipid Panel    Component Value Date/Time   CHOL 157 02/10/2015 0126   TRIG 61 02/10/2015 0126   HDL 53 02/10/2015 0126   CHOLHDL 3.0 02/10/2015 0126   VLDL 12 02/10/2015 0126   LDLCALC 92 02/10/2015 0126   LDLDIRECT 121.0 03/14/2010 0931      Wt Readings from Last 3 Encounters:  03/20/15 102 lb (46.267 kg)  03/06/15 101 lb (45.813 kg)  02/22/15 101 lb 9.6 oz (46.085 kg)       Other studies Reviewed: Additional studies/ records that were reviewed today include: nuclear stress test  Review of the above records demonstrates:  -- nuclear stress test (02/11/15): negative for ischemia. EF 21%.   ASSESSMENT AND PLAN:  Mackenzie [name] is a 64 y.o. 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  New onset atrial fibrillation-  s/p TEE/DCCV on 02/13/15, but noted to be back in afib in office on 02/22/15. She was started on amiodarone 200mg  BID with follow up with me in 3 weeks w/ plan to do DCCV if she remained in afib. Today she presents in NSR. HR 48. We will change her Lopressor 25mg  BID to Toprol XL 25mg  (reduced dose due to bradycardia and convenience of once daily dosing) . We will also reduce amiodarone 200mg  BID--> 200mg  qd.  -- CHAD-vasc score of 3. Continue  Xarelto 20mg  for thromboembolic prophylaxis -- Will obtain TSH, CXR and LFTs on amiodarone therapy. She has been told to get regular opthalmologic eye exam q year.   Acute systolic CHF- EF 123XX123, with diffuse HK- felt to be tachycardia mediated.   -- She has no further s/s CHF. Weight stable today -- Continue losartan 25mg  qd and Toprol XL 25mg  qd. Continue lasix 20mg  po qd -- Repeat 2D ECHO in 2 months to re-assess LV function ( end of  January).   HTN- BP 155/90. Continue Losartan 25 mg. As above we will change BB from Lopressor 25mg  BID to Toprol XL 25 qd. Since we are decreasing her dose (due to bradycardia/she would like a once daily drug/also better in the setting of S-CHF) I have asked her to watch her BP at home and make sure it doesn't become elevated.  We can increase ARB if she does find her BP elevated.    GERD- continue PPI  Normal TSH/T4 mildly elevated- will check TSH today on amiodarone  Esophageal stricture with previous dilations- she crushes her pills.   Current medicines are reviewed at length with the patient today.  The patient does not have concerns regarding medicines.  The following changes have been made:  Amiodarone 200 BID--> 200mg  qd. Lopressor 25mg  BID --> Toprol XL 25 qd  Labs/ tests ordered today include:   Orders Placed This Encounter  Procedures  . DG Chest 2 View  . TSH  . Hepatic function panel  . EKG 12-Lead  . ECHOCARDIOGRAM COMPLETE     Disposition:   FU with Dr. Martinique in 2 months after 2D ECHO  Signed, Eileen Stanford, PA-C  03/20/2015 9:27 AM    [address], [place], [place]  [address] Phone: [phone]; Fax: [phone]

## 2015-03-20 ENCOUNTER — Ambulatory Visit (INDEPENDENT_AMBULATORY_CARE_PROVIDER_SITE_OTHER): Payer: BLUE CROSS/BLUE SHIELD | Admitting: Physician Assistant

## 2015-03-20 ENCOUNTER — Encounter: Payer: Self-pay | Admitting: Physician Assistant

## 2015-03-20 ENCOUNTER — Ambulatory Visit: Payer: BLUE CROSS/BLUE SHIELD | Admitting: Physician Assistant

## 2015-03-20 VITALS — BP 155/90 | HR 48 | Ht 60.0 in | Wt 102.0 lb

## 2015-03-20 DIAGNOSIS — I5021 Acute systolic (congestive) heart failure: Secondary | ICD-10-CM | POA: Diagnosis not present

## 2015-03-20 DIAGNOSIS — I4891 Unspecified atrial fibrillation: Secondary | ICD-10-CM | POA: Diagnosis not present

## 2015-03-20 LAB — TSH: TSH: 3.678 u[IU]/mL (ref 0.350–4.500)

## 2015-03-20 LAB — HEPATIC FUNCTION PANEL
ALBUMIN: 4.3 g/dL (ref 3.6–5.1)
ALK PHOS: 36 U/L (ref 33–130)
ALT: 17 U/L (ref 6–29)
AST: 23 U/L (ref 10–35)
BILIRUBIN TOTAL: 0.6 mg/dL (ref 0.2–1.2)
Bilirubin, Direct: 0.1 mg/dL (ref <=0.2)
Indirect Bilirubin: 0.5 mg/dL (ref 0.2–1.2)
TOTAL PROTEIN: 7.9 g/dL (ref 6.1–8.1)

## 2015-03-20 MED ORDER — AMIODARONE HCL 200 MG PO TABS: 200.0000 mg | ORAL_TABLET | Freq: Every day | ORAL | Status: DC

## 2015-03-20 MED ORDER — METOPROLOL SUCCINATE ER 25 MG PO TB24: 25.0000 mg | ORAL_TABLET | Freq: Every day | ORAL | Status: DC

## 2015-03-20 NOTE — Patient Instructions (Signed)
Medication Instructions:  Your physician has recommended you make the following change in your medication:  1) STOP Lopressor  2) START Toprol-XL 25 mg by mouth ONCE daily 3) DECREASE Amiodarone to 200 mg by mouth ONCE daily   Labwork: Your physician recommends that you return for lab work today: (TSH, LFT)  Testing/Procedures: A chest x-ray takes a picture of the organs and structures inside the chest, including the heart, lungs, and blood vessels. This test can show several things, including, whether the heart is enlarges; whether fluid is building up in the lungs; and whether pacemaker / defibrillator leads are still in place.  Your physician has requested that you have an echocardiogram. Echocardiography is a painless test that uses sound waves to create images of your heart. It provides your doctor with information about the size and shape of your heart and how well your heart's chambers and valves are working. This procedure takes approximately one hour. There are no restrictions for this procedure. END OF [facility] 2017   Follow-Up: Your physician recommends that you schedule a follow-up appointment WITH Dr. Martinique after the ECHO.    Any Other Special Instructions Will Be Listed Below (If Applicable).  It is recommended that you schedule an appointment with your ophthalmologist.  The physician would like you to check your blood pressure DAILY.  Keep a journal of these daily BP and heart rate reading and call our office with the results. Call ou office if consistently GREATER than 150/90.   If you need a refill on your cardiac medications before your next appointment, please call your pharmacy.

## 2015-03-21 ENCOUNTER — Telehealth: Payer: Self-pay | Admitting: Cardiology

## 2015-03-21 MED ORDER — METOPROLOL TARTRATE 25 MG PO TABS: 12.5000 mg | ORAL_TABLET | Freq: Two times a day (BID) | ORAL | Status: DC

## 2015-03-21 NOTE — Telephone Encounter (Signed)
Pt recently prescribed metoprolol succ but has been on tartrate before. She notes warning not to crush pill. She needs to crush her meds to take.  Explained advisement not to crush d/t long acting form of medication. Advised probably need to return her to previous dose/formulation, will verify OK by pharmacy.

## 2015-03-21 NOTE — Telephone Encounter (Signed)
Switch her to metoprolol tartrate 25 mg tabs with directions of 1/2 tablet twice daily.  This can be crushed.   Looks like switch was made for once daily dosing, but she will need to take twice daily if crushing tablet is required.

## 2015-03-21 NOTE — Telephone Encounter (Signed)
New Message   Pt c/o medication issue:  1. Name of Medication: Metetoprolol  4. What is your medication issue? Pt states that she was placed on metoprolol. Pt states that she cannot crush the metoprolol pt req a call back to determine if she should be placed back on the lopressor or is there an alternative because she has to crush her medications.

## 2015-03-21 NOTE — Telephone Encounter (Signed)
Directions of use communicated to patient, and Rx sent to pharmacy. Pt voiced understanding.

## 2015-05-10 ENCOUNTER — Other Ambulatory Visit: Payer: Self-pay

## 2015-05-10 ENCOUNTER — Ambulatory Visit (HOSPITAL_COMMUNITY): Payer: BLUE CROSS/BLUE SHIELD | Attending: Internal Medicine

## 2015-05-10 DIAGNOSIS — I071 Rheumatic tricuspid insufficiency: Secondary | ICD-10-CM | POA: Diagnosis not present

## 2015-05-10 DIAGNOSIS — I5021 Acute systolic (congestive) heart failure: Secondary | ICD-10-CM | POA: Diagnosis not present

## 2015-05-10 DIAGNOSIS — I509 Heart failure, unspecified: Secondary | ICD-10-CM | POA: Diagnosis present

## 2015-05-10 DIAGNOSIS — I5189 Other ill-defined heart diseases: Secondary | ICD-10-CM | POA: Diagnosis not present

## 2015-05-11 ENCOUNTER — Telehealth: Payer: Self-pay | Admitting: Cardiology

## 2015-05-11 NOTE — Telephone Encounter (Signed)
Returned call to patient Echo results given. 

## 2015-05-11 NOTE — Telephone Encounter (Signed)
New Message: ° ° ° ° ° ° °Pt is calling for lab results. °

## 2015-06-02 ENCOUNTER — Encounter: Payer: Self-pay | Admitting: Cardiology

## 2015-06-02 ENCOUNTER — Ambulatory Visit (INDEPENDENT_AMBULATORY_CARE_PROVIDER_SITE_OTHER): Payer: BLUE CROSS/BLUE SHIELD | Admitting: Cardiology

## 2015-06-02 VITALS — BP 150/82 | HR 52 | Ht 61.0 in | Wt 102.8 lb

## 2015-06-02 DIAGNOSIS — I5022 Chronic systolic (congestive) heart failure: Secondary | ICD-10-CM | POA: Diagnosis not present

## 2015-06-02 DIAGNOSIS — I4891 Unspecified atrial fibrillation: Secondary | ICD-10-CM | POA: Diagnosis not present

## 2015-06-02 DIAGNOSIS — I5021 Acute systolic (congestive) heart failure: Secondary | ICD-10-CM

## 2015-06-02 MED ORDER — AMIODARONE HCL 200 MG PO TABS: 100.0000 mg | ORAL_TABLET | Freq: Every day | ORAL | Status: DC

## 2015-06-02 NOTE — Patient Instructions (Signed)
Stop taking lasix  Reduce amiodarone to 100 mg daily  I will see you in 6 months.

## 2015-06-02 NOTE — Progress Notes (Signed)
Cardiology Office Note   Date:  06/02/2015   ID:  Mackenzie [name], DOB 19-Jul-1950, MRN [id number]  PCP:  Laurey Morale, MD  Cardiologist:  Dr. Martinique    PAF- office follow up    History of Present Illness: Mackenzie [name] is a 65 y.o. female with a history of HTN, GERD, and esophageal stricture with previous dilations who and new onset atrial fibrillation s/p DCCV (02/13/15) and  tachycardia mediated CM who presents to clinic for follow up.  She presented to [facility] on 02/09/15 with SOB and palpitations. She was found to have new onset atrial fibrillation w/ RVR w/ evidence of CHF and mildly elevated troponin.   A 2D ECHO was ordered which revealed severe LV dysfunction with EF 25-30%. Mild MR and mild LAE. She underwent a nuclear stress test on 02/11/15 which was negative for ischemia. EF 21%. Her severe LV dysfunction was felt to be due to tachycardia and restoration of NSR important. She underwent successful TEE/DCCV on 02/13/15. On 02/22/15 she was noted to be in recurrent atrial fibrillation w/ CVR with minimal sx aside from fatigue. She was started on amiodarone 200mg  BID and scheduled for follow up with  with plans for DCCV if she remained in afib. On subsequent follow up she had converted to NSR On follow up today she is feeling very well. No chest pain, palpitations, dizziness, dyspnea, or edema.   Past Medical History  Diagnosis Date  . GERD (gastroesophageal reflux disease)   . Sebaceous cyst     on back of neck, has seen Dr. Ronnald Collum  . Osteoporosis     sees Dr. Carrolyn Meiers   . Full dentures   . Esophageal stricture     a. requiring dilations. crushes pills to take PO  . PAF (paroxysmal atrial fibrillation) ([place])     a. s/p succesfful DCCV on 02/13/15  . Chronic systolic CHF (congestive heart failure) ([place])     a. 2D ECHO on 02/10/15 w/ severe LV dysfunction with EF 25-30%. Mild MR and mild LAE. negative nuclear stress test, felt to be due to tachycardia     Past  Surgical History  Procedure Laterality Date  . Esophagogastroduodenoscopy  06/01/2003    12/13  . Colonoscopy  09/24/2002  . Esophagogastroduodenoscopy (egd) with esophageal dilation    . Rigid esophagoscopy N/A 01/18/2013    Procedure: RIGID ESOPHAGOSCOPY WITH ESPHAGEAL Balloon DILATION ;  Surgeon: Melida Quitter, MD;  Location: [place];  Service: ENT;  Laterality: N/A;  . Tee without cardioversion N/A 02/13/2015    Procedure: TRANSESOPHAGEAL ECHOCARDIOGRAM (TEE);  Surgeon: Pixie Casino, MD;  Location: [facility] ENDOSCOPY;  Service: Cardiovascular;  Laterality: N/A;  . Cardioversion N/A 02/13/2015    Procedure: CARDIOVERSION;  Surgeon: Pixie Casino, MD;  Location: [facility] ENDOSCOPY;  Service: Cardiovascular;  Laterality: N/A;     Current Outpatient Prescriptions  Medication Sig Dispense Refill  . amiodarone (PACERONE) 200 MG tablet Take 0.5 tablets (100 mg total) by mouth daily. 30 tablet 6  . lansoprazole (PREVACID) 30 MG capsule Take 1 capsule (30 mg total) by mouth daily. 90 capsule 3  . losartan (COZAAR) 25 MG tablet Take 1 tablet (25 mg total) by mouth daily. 30 tablet 6  . metoprolol tartrate (LOPRESSOR) 25 MG tablet Take 0.5 tablets (12.5 mg total) by mouth 2 (two) times daily. 30 tablet 11  . rivaroxaban (XARELTO) 20 MG TABS tablet Take 1 tablet (20 mg total) by mouth daily with supper.  30 tablet 11   No current facility-administered medications for this visit.    Allergies:   Ramipril    Social History:  The patient  reports that she has never smoked. She has never used smokeless tobacco. She reports that she does not drink alcohol or use illicit drugs.   Family History:  The patient's family history includes Aneurysm in her father; Diabetes type II in her sister; Heart attack in her brother and brother; Heart attack (age of onset: 74) in her mother; Hypertension in her sister and sister. There is no history of Stroke.    ROS:  Please see the history of present  illness.   Otherwise, review of systems are positive for NONE.   All other systems are reviewed and negative.    PHYSICAL EXAM: VS:  BP 150/82 mmHg  Pulse 52  Ht 5\' 1"  (1.549 m)  Wt 46.63 kg (102 lb 12.8 oz)  BMI 19.43 kg/m2 , BMI Body mass index is 19.43 kg/(m^2). GEN: Well nourished, well developed, in no acute distress HEENT: normal Neck: no JVD, carotid bruits, or masses Cardiac: RRR; no murmurs, rubs, or gallops,no edema  Respiratory:  clear to auscultation bilaterally, normal work of breathing GI: soft, nontender, nondistended, + BS MS: no deformity or atrophy Skin: warm and dry, no rash Neuro:  Strength and sensation are intact Psych: euthymic mood, full affect   EKG:  EKG is not ordered today.   Recent Labs: 02/09/2015: B Natriuretic Peptide 1026.2*; Magnesium 1.7 03/06/2015: BUN 15; Creatinine, Ser 1.08*; Hemoglobin 13.0; Platelets 217; Potassium 3.6; Sodium 139 03/20/2015: ALT 17; TSH 3.678    Lipid Panel    Component Value Date/Time   CHOL 157 02/10/2015 0126   TRIG 61 02/10/2015 0126   HDL 53 02/10/2015 0126   CHOLHDL 3.0 02/10/2015 0126   VLDL 12 02/10/2015 0126   LDLCALC 92 02/10/2015 0126   LDLDIRECT 121.0 03/14/2010 0931      Wt Readings from Last 3 Encounters:  06/02/15 46.63 kg (102 lb 12.8 oz)  03/20/15 46.267 kg (102 lb)  03/06/15 45.813 kg (101 lb)       Other studies Reviewed: Additional studies/ records that were reviewed today include: Echo Review of the above records demonstrates:  Echo 05/10/15: Study Conclusions  - Left ventricle: The cavity size was normal. Wall thickness was normal. Systolic function was normal. The estimated ejection fraction was in the range of 55% to 60%. Wall motion was normal; there were no regional wall motion abnormalities. Doppler parameters are consistent with psuedonormal left ventricular relaxation (grade 2 diastolic dysfunction). The E/A ratio is >1.5. The E/e&' ratio is >20, suggesting  markedly elevated LV filling pressure. - Mitral valve: Mildly thickened leaflets . - Left atrium: The atrium was normal in size. - Right ventricle: The cavity size was normal. Wall thickness was normal. Systolic function is reduced. Lateral annulus peak S velocity: 8.79 cm/s. - Atrial septum: No defect or patent foramen ovale was identified. - Tricuspid valve: There was mild regurgitation. - Inferior vena cava: The vessel was normal in size. The respirophasic diameter changes were in the normal range (= 50%), consistent with normal central venous pressure.  Impressions:  - Compared to a prior study in 01/2015, the EF has normalized. There is Grade 2 diastolic dysfunction with elevated LV filling pressure.    ASSESSMENT AND PLAN: 1. Atrial fibrillation-paroxysmal.  s/p TEE/DCCV on 02/13/15, but noted to be back in afib in office on 02/22/15. Subsequently converted to NSR on amiodarone.  We will reduce amiodarone to 100 mg daily to minimize side effects.  -- CHAD-vasc score of 3. Continue  Xarelto 20mg  for thromboembolic prophylaxis   2. Acute systolic CHF- EF 123XX123, with diffuse HK- felt to be tachycardia mediated.  EF has now returned to normal with management of her arrhythmia.  -- She has no further s/s CHF. Weight stable today -- Will DC lasix. Continue other therapy.  3.HTN- BP well controlled.    4.GERD- continue PPI  5. Esophageal stricture with previous dilations- she crushes her pills.   Current medicines are reviewed at length with the patient today.  The patient does not have concerns regarding medicines.  The following changes have been made:  Amiodarone 200 qd--> 100mg  qd. Stop lasix.  Labs/ tests ordered today include:   Orders Placed This Encounter  Procedures  . Comprehensive Metabolic Panel (CMET)  . TSH     Disposition:   FU with Dr. Martinique in 6 months with above lab.  Signed, Peter Martinique, MD  06/02/2015 11:50 AM    [address], [place], [place]  [address] Phone: [phone]; Fax: [phone]

## 2015-07-25 DIAGNOSIS — M79652 Pain in left thigh: Secondary | ICD-10-CM | POA: Diagnosis not present

## 2015-09-07 ENCOUNTER — Other Ambulatory Visit: Payer: Self-pay

## 2015-09-07 MED ORDER — LOSARTAN POTASSIUM 25 MG PO TABS: 25.0000 mg | ORAL_TABLET | Freq: Every day | ORAL | Status: DC

## 2015-10-10 ENCOUNTER — Telehealth: Payer: Self-pay | Admitting: Cardiology

## 2015-10-10 NOTE — Telephone Encounter (Signed)
Please call,pt says she is having some problems. She thinks she might need to be seen before next available appointment in October.

## 2015-10-10 NOTE — Telephone Encounter (Signed)
Spoke with patient and she stated only having one issue  At night when she is propped up and during night she feels like she has to take in a deep breath to catch her breath. She denies increased shortness of breath, does have swelling on some days that goes down at night Scheduled her a follow up appointment with Lurena Joiner PA next week since she was due for follow up with Dr Martinique and he has no availabilities

## 2015-10-10 NOTE — Telephone Encounter (Signed)
Patient aware of date and time.  

## 2015-10-19 ENCOUNTER — Encounter: Payer: Self-pay | Admitting: Cardiology

## 2015-10-19 ENCOUNTER — Ambulatory Visit (INDEPENDENT_AMBULATORY_CARE_PROVIDER_SITE_OTHER): Payer: BLUE CROSS/BLUE SHIELD | Admitting: Cardiology

## 2015-10-19 VITALS — BP 158/78 | HR 54 | Wt 103.0 lb

## 2015-10-19 DIAGNOSIS — I480 Paroxysmal atrial fibrillation: Secondary | ICD-10-CM

## 2015-10-19 DIAGNOSIS — IMO0001 Reserved for inherently not codable concepts without codable children: Secondary | ICD-10-CM

## 2015-10-19 DIAGNOSIS — R030 Elevated blood-pressure reading, without diagnosis of hypertension: Secondary | ICD-10-CM | POA: Diagnosis not present

## 2015-10-19 DIAGNOSIS — Z7901 Long term (current) use of anticoagulants: Secondary | ICD-10-CM

## 2015-10-19 NOTE — Assessment & Plan Note (Signed)
Xarelto- CHADs VASc=3

## 2015-10-19 NOTE — Progress Notes (Signed)
10/19/2015 Mackenzie [name]   12-25-1950  [id number]  Primary Physician Laurey Morale, MD Primary Cardiologist: Dr Martinique  HPI:  65 y/o thin Caucasian female with a history of PAF and tachycardia mediated cardiomyopathy. She was cardioverted in Oct 2016 but had recurrent PAF in Nov 2016. Amiodarone was added and she converted on her own on this as an OP.  Echo in Jan 2017 showed an improved EF of 55%. She is in the office today for a 6 month check. She has been doing well except for an episode on 10/17/15 when she became nauseated and sweaty while watching TV. She has had no other episodes of near syncope or syncope. No tachycardia.    Current Outpatient Prescriptions  Medication Sig Dispense Refill  . amiodarone (PACERONE) 200 MG tablet Take 0.5 tablets (100 mg total) by mouth daily. 30 tablet 6  . lansoprazole (PREVACID) 30 MG capsule Take 1 capsule (30 mg total) by mouth daily. 90 capsule 3  . losartan (COZAAR) 25 MG tablet Take 1 tablet (25 mg total) by mouth daily. 30 tablet 6  . metoprolol tartrate (LOPRESSOR) 25 MG tablet Take 0.5 tablets (12.5 mg total) by mouth 2 (two) times daily. 30 tablet 11  . rivaroxaban (XARELTO) 20 MG TABS tablet Take 1 tablet (20 mg total) by mouth daily with supper. 30 tablet 11   No current facility-administered medications for this visit.    Allergies  Allergen Reactions  . Ramipril     cough    Social History   Social History  . Marital Status: Married    Spouse Name: N/A  . Number of Children: N/A  . Years of Education: N/A   Occupational History  . Not on file.   Social History Main Topics  . Smoking status: Never Smoker   . Smokeless tobacco: Never Used  . Alcohol Use: No  . Drug Use: No  . Sexual Activity: Not on file   Other Topics Concern  . Not on file   Social History Narrative     Review of Systems: General: negative for chills, fever, night sweats or weight changes.  Cardiovascular: negative for chest pain, dyspnea  on exertion, edema, orthopnea, palpitations, paroxysmal nocturnal dyspnea or shortness of breath Dermatological: negative for rash Respiratory: negative for cough or wheezing Urologic: negative for hematuria Abdominal: negative for nausea, vomiting, diarrhea, bright red blood per rectum, melena, or hematemesis Neurologic: negative for visual changes, syncope, or dizziness All other systems reviewed and are otherwise negative except as noted above.    Blood pressure 158/78, pulse 54, weight 103 lb (46.72 kg).  General appearance: alert, cooperative and no distress Neck: no carotid bruit and no JVD Lungs: clear to auscultation bilaterally Heart: regular rate and rhythm Extremities: no edema Skin: Skin color, texture, turgor normal. No rashes or lesions Neurologic: Grossly normal  EKG NSR, SB 54, QTc 443  ASSESSMENT AND PLAN:   PAF (paroxysmal atrial fibrillation) ([facility])  s/p succesfful DCCV on 02/13/15  But recurrent AF in Nov-started on Amiodarone and converted on this.  Chronic anticoagulation Xarelto- CHADs VASc=3  Elevated BP Some readings at home > [id number] systolic- will have her monitor and return for a B/P check in 4 weeks.    PLAN  Same Rx for now. I asked her to monitor her B/P at home and return in 4 weeks for a B/P check. If she has any further "spells" I would stop her Metoprolol and obtain an event monitor.   Kerin Ransom  PA-C 10/19/2015 9:57 AM

## 2015-10-19 NOTE — Patient Instructions (Signed)
Your physician recommends that you schedule a follow-up appointment in: 4 Weeks Blood Pressure check with Erasmo Downer

## 2015-10-19 NOTE — Assessment & Plan Note (Signed)
s/p succesfful DCCV on 02/13/15  But recurrent AF in Nov-started on Amiodarone and converted on this.

## 2015-10-19 NOTE — Assessment & Plan Note (Signed)
Some readings at home > [id number] systolic- will have her monitor and return for a B/P check in 4 weeks.

## 2015-11-23 DIAGNOSIS — H01004 Unspecified blepharitis left upper eyelid: Secondary | ICD-10-CM | POA: Diagnosis not present

## 2015-12-12 ENCOUNTER — Encounter: Payer: Self-pay | Admitting: Family Medicine

## 2015-12-12 ENCOUNTER — Ambulatory Visit (INDEPENDENT_AMBULATORY_CARE_PROVIDER_SITE_OTHER): Payer: BLUE CROSS/BLUE SHIELD | Admitting: Family Medicine

## 2015-12-12 ENCOUNTER — Other Ambulatory Visit: Payer: Self-pay | Admitting: Physician Assistant

## 2015-12-12 VITALS — BP 138/81 | HR 61 | Temp 98.3°F | Ht 61.0 in | Wt 102.0 lb

## 2015-12-12 DIAGNOSIS — G4700 Insomnia, unspecified: Secondary | ICD-10-CM | POA: Diagnosis not present

## 2015-12-12 DIAGNOSIS — I5021 Acute systolic (congestive) heart failure: Secondary | ICD-10-CM

## 2015-12-12 DIAGNOSIS — I4891 Unspecified atrial fibrillation: Secondary | ICD-10-CM

## 2015-12-12 MED ORDER — TEMAZEPAM 30 MG PO CAPS: 30.0000 mg | ORAL_CAPSULE | Freq: Every evening | ORAL | 5 refills | Status: DC | PRN

## 2015-12-12 NOTE — Progress Notes (Signed)
Pre visit review using our clinic review tool, if applicable. No additional management support is needed unless otherwise documented below in the visit note. 

## 2015-12-12 NOTE — Progress Notes (Signed)
   Subjective:    Patient ID: Mackenzie [name], female    DOB: October 12, 1950, 64 y.o.   MRN: [id number]  HPI Here asking for help with insomnia for the past few months. She has a hard time relaxing and she often wakes up during the night.    Review of Systems  Constitutional: Negative.   Respiratory: Negative.   Cardiovascular: Negative.   Neurological: Negative.   Psychiatric/Behavioral: Positive for sleep disturbance. Negative for agitation, behavioral problems, confusion, decreased concentration, dysphoric mood and hallucinations. The patient is not nervous/anxious and is not hyperactive.        Objective:   Physical Exam  Constitutional: She is oriented to person, place, and time. She appears well-developed and well-nourished.  Cardiovascular: Normal rate, regular rhythm, normal heart sounds and intact distal pulses.   Pulmonary/Chest: Effort normal and breath sounds normal.  Neurological: She is alert and oriented to person, place, and time.  Psychiatric: She has a normal mood and affect. Her behavior is normal. Thought content normal.          Assessment & Plan:  Insomnia, try Temazepam.  Laurey Morale, MD

## 2015-12-24 ENCOUNTER — Other Ambulatory Visit: Payer: Self-pay | Admitting: Family Medicine

## 2016-02-11 ENCOUNTER — Other Ambulatory Visit: Payer: Self-pay | Admitting: Physician Assistant

## 2016-02-15 ENCOUNTER — Encounter: Payer: Self-pay | Admitting: Cardiology

## 2016-02-22 DIAGNOSIS — I4891 Unspecified atrial fibrillation: Secondary | ICD-10-CM | POA: Diagnosis not present

## 2016-02-22 DIAGNOSIS — I5021 Acute systolic (congestive) heart failure: Secondary | ICD-10-CM | POA: Diagnosis not present

## 2016-02-22 DIAGNOSIS — I5022 Chronic systolic (congestive) heart failure: Secondary | ICD-10-CM | POA: Diagnosis not present

## 2016-02-22 LAB — TSH: TSH: 2.24 m[IU]/L

## 2016-02-22 LAB — COMPREHENSIVE METABOLIC PANEL
ALK PHOS: 28 U/L — AB (ref 33–130)
ALT: 12 U/L (ref 6–29)
AST: 20 U/L (ref 10–35)
Albumin: 3.9 g/dL (ref 3.6–5.1)
BILIRUBIN TOTAL: 0.6 mg/dL (ref 0.2–1.2)
BUN: 15 mg/dL (ref 7–25)
CALCIUM: 9.3 mg/dL (ref 8.6–10.4)
CO2: 23 mmol/L (ref 20–31)
Chloride: 104 mmol/L (ref 98–110)
Creat: 0.97 mg/dL (ref 0.50–0.99)
Glucose, Bld: 91 mg/dL (ref 65–99)
Potassium: 4.3 mmol/L (ref 3.5–5.3)
Sodium: 139 mmol/L (ref 135–146)
TOTAL PROTEIN: 7.2 g/dL (ref 6.1–8.1)

## 2016-02-23 ENCOUNTER — Telehealth: Payer: Self-pay | Admitting: Cardiology

## 2016-02-23 NOTE — Telephone Encounter (Signed)
F/u Message ° °Pt returning RN call. Please call back to discuss  °

## 2016-02-23 NOTE — Telephone Encounter (Signed)
Returned call to patient recent lab results given. 

## 2016-02-28 ENCOUNTER — Other Ambulatory Visit: Payer: BLUE CROSS/BLUE SHIELD

## 2016-02-28 NOTE — Progress Notes (Signed)
Cardiology Office Note   Date:  03/01/2016   ID:  Mackenzie [name], DOB 09/03/1950, MRN [id number]  PCP:  Mackenzie Morale, MD  Cardiologist:  Dr. Martinique    PAF- office follow up   History of Present Illness: Mackenzie [name] is a 65 y.o. female with a history of HTN, GERD, and esophageal stricture with previous dilations who and new onset atrial fibrillation s/p DCCV (02/13/15) and  tachycardia mediated CM who presents to clinic for follow up.  She presented to [facility] on 02/09/15 with SOB and palpitations. She was found to have new onset atrial fibrillation w/ RVR w/ evidence of CHF and mildly elevated troponin.   A 2D ECHO was ordered which revealed severe LV dysfunction with EF 25-30%. Mild MR and mild LAE. She underwent a nuclear stress test on 02/11/15 which was negative for ischemia. EF 21%. Her severe LV dysfunction was felt to be due to tachycardia and restoration of NSR important. She underwent successful TEE/DCCV on 02/13/15.  On 02/22/15 she was noted to be in recurrent atrial fibrillation w/ CVR with minimal sx aside from fatigue. She was started on amiodarone 200mg  BID and scheduled for follow up with  with plans for DCCV if she remained in afib. On subsequent follow up she had converted to NSR. Echo in Jan. 2017 showed normal LV function with EF 55-60%.   On follow up today she is feeling very well. No chest pain, palpitations, dizziness, dyspnea, or edema. Occasionally feels she has to catch her breath. Rare episodes of nausea and sweating. Last episode July 4. Will need esophageal dilation.   Past Medical History:  Diagnosis Date  . Chronic systolic CHF (congestive heart failure) (Mackenzie [name])    a. 2D ECHO on 02/10/15 w/ severe LV dysfunction with EF 25-30%. Mild MR and mild LAE. negative nuclear stress test, felt to be due to tachycardia   . Esophageal stricture    a. requiring dilations. crushes pills to take PO  . Full dentures   . GERD (gastroesophageal reflux disease)   .  Osteoporosis    sees Dr. Carrolyn [name]   . PAF (paroxysmal atrial fibrillation) (Mackenzie [name])    a. s/p succesfful DCCV on 02/13/15  . Sebaceous cyst    on back of neck, has seen Dr. Ronnald [name]    Past Surgical History:  Procedure Laterality Date  . CARDIOVERSION N/A 02/13/2015   Procedure: CARDIOVERSION;  Surgeon: Mackenzie Casino, MD;  Location: [place];  Service: Cardiovascular;  Laterality: N/A;  . COLONOSCOPY  09/24/2002  . ESOPHAGOGASTRODUODENOSCOPY  06/01/2003   12/13  . ESOPHAGOGASTRODUODENOSCOPY (EGD) WITH ESOPHAGEAL DILATION    . RIGID ESOPHAGOSCOPY N/A 01/18/2013   Procedure: RIGID ESOPHAGOSCOPY WITH ESPHAGEAL Balloon DILATION ;  Surgeon: Mackenzie Quitter, MD;  Location: [place];  Service: ENT;  Laterality: N/A;  . TEE WITHOUT CARDIOVERSION N/A 02/13/2015   Procedure: TRANSESOPHAGEAL ECHOCARDIOGRAM (TEE);  Surgeon: Mackenzie Casino, MD;  Location: [facility] ENDOSCOPY;  Service: Cardiovascular;  Laterality: N/A;     Current Outpatient Prescriptions  Medication Sig Dispense Refill  . amiodarone (PACERONE) 200 MG tablet Take 0.5 tablets (100 mg total) by mouth daily. 30 tablet 6  . lansoprazole (PREVACID) 30 MG capsule TAKE 1 CAPSULE (30 MG TOTAL) BY MOUTH DAILY. 90 capsule 0  . losartan (COZAAR) 25 MG tablet Take 1 tablet (25 mg total) by mouth daily. 30 tablet 6  . metoprolol tartrate (LOPRESSOR) 25 MG tablet Take 0.5 tablets (12.5 mg total) by mouth  2 (two) times daily. 30 tablet 11  . temazepam (RESTORIL) 30 MG capsule Take 1 capsule (30 mg total) by mouth at bedtime as needed for sleep. 30 capsule 5  . XARELTO 20 MG TABS tablet TAKE 1 TABLET (20 MG TOTAL) BY MOUTH DAILY WITH SUPPER. 30 tablet 2   No current facility-administered medications for this visit.     Allergies:   Ramipril    Social History:  The patient  reports that she has never smoked. She has never used smokeless tobacco. She reports that she does not drink alcohol or use drugs.   Family History:  The  patient's family history includes Aneurysm in her father; Diabetes type II in her sister; Heart attack in her brother and brother; Heart attack (age of onset: 46) in her mother; Hypertension in her sister and sister.    ROS:  Please see the history of present illness.   Otherwise, review of systems are positive for NONE.   All other systems are reviewed and negative.    PHYSICAL EXAM: VS:  BP (!) 157/73   Pulse (!) 57   Ht 5\' 1"  (1.549 m)   Wt 101 lb 6.4 oz (46 kg)   BMI 19.16 kg/m  , BMI Body mass index is 19.16 kg/m. GEN: Well nourished, well developed, in no acute distress  HEENT: normal  Neck: no JVD, carotid bruits, or masses Cardiac: RRR; no murmurs, rubs, or gallops,no edema  Respiratory:  clear to auscultation bilaterally, normal work of breathing GI: soft, nontender, nondistended, + BS MS: no deformity or atrophy  Skin: warm and dry, no rash Neuro:  Strength and sensation are intact Psych: euthymic mood, full affect   EKG:  EKG is not ordered today.   Recent Labs: 03/06/2015: Hemoglobin 13.0; Platelets 217 02/22/2016: ALT 12; BUN 15; Creat 0.97; Potassium 4.3; Sodium 139; TSH 2.24    Lipid Panel    Component Value Date/Time   CHOL 157 02/10/2015 0126   TRIG 61 02/10/2015 0126   HDL 53 02/10/2015 0126   CHOLHDL 3.0 02/10/2015 0126   VLDL 12 02/10/2015 0126   LDLCALC 92 02/10/2015 0126   LDLDIRECT 121.0 03/14/2010 0931      Wt Readings from Last 3 Encounters:  03/01/16 101 lb 6.4 oz (46 kg)  12/12/15 102 lb (46.3 kg)  10/19/15 103 lb (46.7 kg)       Other studies Reviewed: Additional studies/ records that were reviewed today include: Echo Review of the above records demonstrates:  Echo 05/10/15: Study Conclusions  - Left ventricle: The cavity size was normal. Wall thickness was normal. Systolic function was normal. The estimated ejection fraction was in the range of 55% to 60%. Wall motion was normal; there were no regional wall motion  abnormalities. Doppler parameters are consistent with psuedonormal left ventricular relaxation (grade 2 diastolic dysfunction). The E/A ratio is >1.5. The E/e&' ratio is >20, suggesting markedly elevated LV filling pressure. - Mitral valve: Mildly thickened leaflets . - Left atrium: The atrium was normal in size. - Right ventricle: The cavity size was normal. Wall thickness was normal. Systolic function is reduced. Lateral annulus peak S velocity: 8.79 cm/s. - Atrial septum: No defect or patent foramen ovale was identified. - Tricuspid valve: There was mild regurgitation. - Inferior vena cava: The vessel was normal in size. The respirophasic diameter changes were in the normal range (= 50%), consistent with normal central venous pressure.  Impressions:  - Compared to a prior study in 01/2015, the EF  has normalized. There is Grade 2 diastolic dysfunction with elevated LV filling pressure.    ASSESSMENT AND PLAN: 1. Atrial fibrillation-paroxysmal.  s/p TEE/DCCV on 02/13/15, but noted to be back in afib in office on 02/22/15. Subsequently converted to NSR on amiodarone.  Continue  amiodarone to 100 mg daily to minimize side effects. Labs including LFTs and TSH are OK> -- CHAD-vasc score of 3. Continue  Xarelto 20mg  for thromboembolic prophylaxis   2. Acute systolic CHF- EF 123XX123, with diffuse HK- felt to be tachycardia mediated.  EF has now returned to normal with management of her arrhythmia.  -- She has no further s/s CHF. Weight stable today  3.HTN- BP well controlled.    4.GERD- continue PPI  5. Esophageal stricture with previous dilations- she crushes her pills.   Current medicines are reviewed at length with the patient today.  The patient does not have concerns regarding medicines.  The following changes have been made: none  Labs/ tests ordered today include:   No orders of the defined types were placed in this encounter.    Disposition:   FU  with Dr. Martinique in 6 months with above lab.  Signed, Peter Martinique, MD  03/01/2016 3:32 PM    [facility] [address], [place], [place]  [address] Phone: [phone]; Fax: [phone]

## 2016-03-01 ENCOUNTER — Ambulatory Visit (INDEPENDENT_AMBULATORY_CARE_PROVIDER_SITE_OTHER): Payer: BLUE CROSS/BLUE SHIELD | Admitting: Cardiology

## 2016-03-01 ENCOUNTER — Encounter: Payer: Self-pay | Admitting: Cardiology

## 2016-03-01 VITALS — BP 157/73 | HR 57 | Ht 61.0 in | Wt 101.4 lb

## 2016-03-01 DIAGNOSIS — I4891 Unspecified atrial fibrillation: Secondary | ICD-10-CM

## 2016-03-01 DIAGNOSIS — I5022 Chronic systolic (congestive) heart failure: Secondary | ICD-10-CM | POA: Diagnosis not present

## 2016-03-01 NOTE — Patient Instructions (Signed)
Continue your current therapy  I will see you in 6 months.   

## 2016-03-07 ENCOUNTER — Other Ambulatory Visit: Payer: Self-pay | Admitting: Cardiology

## 2016-03-19 DIAGNOSIS — I83813 Varicose veins of bilateral lower extremities with pain: Secondary | ICD-10-CM | POA: Diagnosis not present

## 2016-03-27 ENCOUNTER — Other Ambulatory Visit: Payer: Self-pay | Admitting: Cardiology

## 2016-03-27 ENCOUNTER — Other Ambulatory Visit: Payer: Self-pay | Admitting: Family Medicine

## 2016-04-03 ENCOUNTER — Telehealth: Payer: Self-pay | Admitting: *Deleted

## 2016-04-03 NOTE — Telephone Encounter (Signed)
Patient called no answer.LMTC. 

## 2016-04-04 NOTE — Telephone Encounter (Signed)
Returned call to patient she stated she will need a prior authorization for Xarelto after 04/15/16.Advised I will call [facility] at # [id number].Member ID # [id number].

## 2016-04-19 MED ORDER — RIVAROXABAN 20 MG PO TABS: 20.0000 mg | ORAL_TABLET | Freq: Every day | ORAL | 3 refills | Status: DC

## 2016-04-19 NOTE — Telephone Encounter (Signed)
Prior authorization obtained for Xarelto.Case # [id number] day refill sent to pharmacy.

## 2016-04-19 NOTE — Addendum Note (Signed)
Addended by: Kathyrn Lass on: 04/19/2016 02:07 PM   Modules accepted: Orders

## 2016-05-13 ENCOUNTER — Encounter (HOSPITAL_BASED_OUTPATIENT_CLINIC_OR_DEPARTMENT_OTHER): Payer: Self-pay | Admitting: Emergency Medicine

## 2016-05-13 ENCOUNTER — Emergency Department (HOSPITAL_BASED_OUTPATIENT_CLINIC_OR_DEPARTMENT_OTHER): Payer: BLUE CROSS/BLUE SHIELD

## 2016-05-13 ENCOUNTER — Emergency Department (HOSPITAL_BASED_OUTPATIENT_CLINIC_OR_DEPARTMENT_OTHER)
Admission: EM | Admit: 2016-05-13 | Discharge: 2016-05-13 | Disposition: A | Discharge status: Home / Self Care | Payer: BLUE CROSS/BLUE SHIELD | Attending: Emergency Medicine | Admitting: Emergency Medicine

## 2016-05-13 DIAGNOSIS — L03115 Cellulitis of right lower limb: Secondary | ICD-10-CM | POA: Insufficient documentation

## 2016-05-13 DIAGNOSIS — M25561 Pain in right knee: Secondary | ICD-10-CM | POA: Diagnosis not present

## 2016-05-13 DIAGNOSIS — I5022 Chronic systolic (congestive) heart failure: Secondary | ICD-10-CM | POA: Diagnosis not present

## 2016-05-13 MED ORDER — DOXYCYCLINE HYCLATE 100 MG PO CAPS: 100.0000 mg | ORAL_CAPSULE | Freq: Two times a day (BID) | ORAL | 0 refills | Status: DC

## 2016-05-13 MED ORDER — CEPHALEXIN 500 MG PO CAPS: 500.0000 mg | ORAL_CAPSULE | Freq: Three times a day (TID) | ORAL | 0 refills | Status: DC

## 2016-05-13 NOTE — ED Notes (Signed)
ED Provider at bedside. 

## 2016-05-13 NOTE — ED Provider Notes (Addendum)
[facility] DEPT [facility] Provider Note   CSN: [id number] Arrival date & time: 05/13/16  1945   By signing my name below, I, Neta Mends, attest that this documentation has been prepared under the direction and in the presence of Ezequiel Essex, MD . Electronically Signed: Neta Mends, ED Scribe. 05/13/2016. 11:20 PM.   History   Chief Complaint Chief Complaint  Patient presents with  . Knee Pain    The history is provided by the patient. No language interpreter was used.   HPI Comments:  Mackenzie [name] is a 66 y.o. female who presents to the Emergency Department complaining of constant redness and pain to her right knee since this morning. Pt states that her knee has gotten progressively less red and swollen throughout today. Pt states that her knee felt fine before she went to bed last night. Pt notes a known allergy to ramipril. Pt denies injury/trauma. Pt denies fever, vomiting.  Past Medical History:  Diagnosis Date  . Chronic systolic CHF (congestive heart failure) ([place])    a. 2D ECHO on 02/10/15 w/ severe LV dysfunction with EF 25-30%. Mild MR and mild LAE. negative nuclear stress test, felt to be due to tachycardia   . Esophageal stricture    a. requiring dilations. crushes pills to take PO  . Full dentures   . GERD (gastroesophageal reflux disease)   . Osteoporosis    sees Dr. Carrolyn Meiers   . PAF (paroxysmal atrial fibrillation) ([place])    a. s/p succesfful DCCV on 02/13/15  . Sebaceous cyst    on back of neck, has seen Dr. Ronnald Collum    Patient Active Problem List   Diagnosis Date Noted  . Chronic anticoagulation 10/19/2015  . Demand ischemia ([place]) 02/13/2015  . GERD (gastroesophageal reflux disease)   . Esophageal stricture   . PAF (paroxysmal atrial fibrillation) ([place])   . Chronic systolic CHF (congestive heart failure) ([place])   . Acute systolic CHF (congestive heart failure) ([place]) 02/10/2015  . Atrial fibrillation with RVR ([place]) 02/09/2015  .  Elevated BP 12/02/2013  . Allergic dermatitis 01/16/2012  . OSTEOPOROSIS 01/04/2009  . NECK PAIN 04/15/2007    Past Surgical History:  Procedure Laterality Date  . CARDIOVERSION N/A 02/13/2015   Procedure: CARDIOVERSION;  Surgeon: Pixie Casino, MD;  Location: [facility];  Service: Cardiovascular;  Laterality: N/A;  . COLONOSCOPY  09/24/2002  . ESOPHAGOGASTRODUODENOSCOPY  06/01/2003   12/13  . ESOPHAGOGASTRODUODENOSCOPY (EGD) WITH ESOPHAGEAL DILATION    . RIGID ESOPHAGOSCOPY N/A 01/18/2013   Procedure: RIGID ESOPHAGOSCOPY WITH ESPHAGEAL Balloon DILATION ;  Surgeon: Melida Quitter, MD;  Location: [facility];  Service: ENT;  Laterality: N/A;  . TEE WITHOUT CARDIOVERSION N/A 02/13/2015   Procedure: TRANSESOPHAGEAL ECHOCARDIOGRAM (TEE);  Surgeon: Pixie Casino, MD;  [facility] ENDOSCOPY;  Service: Cardiovascular;  Laterality: N/A;    OB History    No data available       Home Medications    Prior to Admission medications   Medication Sig Start Date End Date Taking? Authorizing Provider  amiodarone (PACERONE) 200 MG tablet Take 0.5 tablets (100 mg total) by mouth daily. 06/02/15   Peter M Martinique, MD  lansoprazole (PREVACID) 30 MG capsule TAKE 1 CAPSULE (30 MG TOTAL) BY MOUTH DAILY. 03/27/16   Laurey Morale, MD  losartan (COZAAR) 25 MG tablet TAKE 1 TABLET (25 MG TOTAL) BY MOUTH DAILY. 03/11/16   Peter M Martinique, MD  metoprolol tartrate (LOPRESSOR) 25 MG tablet TAKE  0.5 TABLETS (12.5 MG TOTAL) BY MOUTH 2 (TWO) TIMES DAILY. 03/27/16   Peter M Martinique, MD  rivaroxaban (XARELTO) 20 MG TABS tablet Take 1 tablet (20 mg total) by mouth daily with supper. 04/19/16   Peter M Martinique, MD  temazepam (RESTORIL) 30 MG capsule Take 1 capsule (30 mg total) by mouth at bedtime as needed for sleep. 12/12/15   Laurey Morale, MD    Family History Family History  Problem Relation Age of Onset  . Heart attack Mother 74    died from massive heart attack  . Aneurysm Father     cerebral  .  Hypertension Sister   . Diabetes type II Sister   . Heart attack Brother   . Hypertension Sister   . Heart attack Brother   . Stroke Neg Hx     Social History Social History  Substance Use Topics  . Smoking status: Never Smoker  . Smokeless tobacco: Never Used  . Alcohol use No     Allergies   Ramipril   Review of Systems Review of Systems  Constitutional: Negative for fever.  Gastrointestinal: Negative for diarrhea and vomiting.  Musculoskeletal: Positive for arthralgias and joint swelling.  Skin: Positive for color change.  All other systems reviewed and are negative.    Physical Exam Updated Vital Signs BP 189/73 (BP Location: Right Arm)   Pulse (!) 58   Temp 98.2 F (36.8 C) (Oral)   Resp 17   Ht 5\' 1"  (1.549 m)   Wt 101 lb (45.8 kg)   SpO2 100%   BMI 19.08 kg/m   Physical Exam  Constitutional: She is oriented to person, place, and time. She appears well-developed and well-nourished. No distress.  HENT:  Head: Normocephalic and atraumatic.  Mouth/Throat: Oropharynx is clear and moist. No oropharyngeal exudate.  Eyes: Conjunctivae and EOM are normal. Pupils are equal, round, and reactive to light.  Neck: Normal range of motion. Neck supple.  No meningismus.  Cardiovascular: Normal rate, regular rhythm, normal heart sounds and intact distal pulses.   No murmur heard. Pulmonary/Chest: Effort normal and breath sounds normal. No respiratory distress.  Abdominal: Soft. There is no tenderness. There is no rebound and no guarding.  Musculoskeletal: Normal range of motion. She exhibits no edema or tenderness.  Mild warmth and erythema anteriorly with small, central questionable pustule. Full ROM of knee without pain. Full intact DP and PT pulses.   Neurological: She is alert and oriented to person, place, and time. No cranial nerve deficit. She exhibits normal muscle tone. Coordination normal.  No ataxia on finger to nose bilaterally. No pronator drift. 5/5  strength throughout. CN 2-12 intact.Equal grip strength. Sensation intact.   Skin: Skin is warm.  Psychiatric: She has a normal mood and affect. Her behavior is normal.  Nursing note and vitals reviewed.      ED Treatments / Results  DIAGNOSTIC STUDIES:  Oxygen Saturation is 100% on RA, normal by my interpretation.    COORDINATION OF CARE:  11:20 PM Discussed treatment plan with pt at bedside and pt agreed to plan.   Labs (all labs ordered are listed, but only abnormal results are displayed) Labs Reviewed - No data to display  EKG  EKG Interpretation None       Radiology Dg Knee Complete 4 Views Right  Result Date: 05/13/2016 CLINICAL DATA:  Nontraumatic pain and erythema to the anterior right knee, onset this morning. EXAM: RIGHT KNEE - COMPLETE 4+ VIEW COMPARISON:  None. FINDINGS:  No evidence of fracture, dislocation, or joint effusion. No evidence of arthropathy or other focal bone abnormality. Soft tissues are unremarkable. IMPRESSION: Negative. Electronically Signed   By: Andreas Newport M.D.   On: 05/13/2016 20:39    Procedures Procedures (including critical care time)  Medications Ordered in ED Medications - No data to display   Initial Impression / Assessment and Plan / ED Course  I have reviewed the triage vital signs and the nursing notes.  Pertinent labs & imaging results that were available during my care of the patient were reviewed by me and considered in my medical decision making (see chart for details).     Atraumatic right knee pain and redness and swelling. No fever. No vomiting.  Exam consistent with early cellulitis, possible bug bite. No evidence of septic joint. We'll prescribe antibiotics, PCP follow-up, return precautions discussed.    Final Clinical Impressions(s) / ED Diagnoses   Final diagnoses:  Cellulitis of knee, right    New Prescriptions New Prescriptions   No medications on file  I personally performed the services  described in this documentation, which was scribed in my presence. The recorded information has been reviewed and is accurate.     Ezequiel Essex, MD 05/13/16 [place], MD 05/14/16 0500

## 2016-05-13 NOTE — Discharge Instructions (Signed)
Follow up with your doctor. Return to the ED if you develop fever, worsening pain, or are unable to move your knee.

## 2016-05-13 NOTE — ED Triage Notes (Signed)
Patient states that she woke up this am and she had redness and swelling to her knee. Patient denies any pain in her calf or her lower ankle

## 2016-05-21 DIAGNOSIS — M79604 Pain in right leg: Secondary | ICD-10-CM | POA: Diagnosis not present

## 2016-05-21 DIAGNOSIS — M79605 Pain in left leg: Secondary | ICD-10-CM | POA: Diagnosis not present

## 2016-06-11 ENCOUNTER — Encounter: Payer: Self-pay | Admitting: Family Medicine

## 2016-06-11 ENCOUNTER — Ambulatory Visit (INDEPENDENT_AMBULATORY_CARE_PROVIDER_SITE_OTHER): Payer: BLUE CROSS/BLUE SHIELD | Admitting: Family Medicine

## 2016-06-11 VITALS — BP 156/86 | HR 56 | Temp 98.4°F | Ht 61.0 in | Wt 100.0 lb

## 2016-06-11 DIAGNOSIS — L03115 Cellulitis of right lower limb: Secondary | ICD-10-CM

## 2016-06-11 MED ORDER — CEPHALEXIN 250 MG/5ML PO SUSR: 500.0000 mg | Freq: Three times a day (TID) | ORAL | 0 refills | Status: DC

## 2016-06-11 NOTE — Progress Notes (Signed)
   Subjective:    Patient ID: Mackenzie [name], female    DOB: January 28, 1951, 66 y.o.   MRN: [id number]  HPI Here to follow up an ER visit on 05-13-16 for cellulitis on the right knee. She presented then with redness, warmth, and tenderness over the anterior knee. No pain on walking, no fever. At the ER the infection appeared to be superficial and not in the joint space. No labs were drawn but an Xray was normal. She was started on Keflex and Doxycycline, but she stopped taking these after 3 days due to diarrhea. Since then the knee seems to be improving. The redness and tenderness are better.    Review of Systems  Constitutional: Negative.   Respiratory: Negative.   Cardiovascular: Negative.   Skin: Positive for wound.       Objective:   Physical Exam  Constitutional: She appears well-developed and well-nourished. No distress.  Cardiovascular: Normal rate, regular rhythm, normal heart sounds and intact distal pulses.   Pulmonary/Chest: Effort normal and breath sounds normal.  Musculoskeletal:  The anterior right knee has a 1 cm pustule directly over the patella. This is red and tender, but not warm. All the surrounding erythema has resolved (compared to the photo in her chart from the ER exam). ROM is full           Assessment & Plan:  Partially treated cellulitis, which appears to be from a spider bite. The pustule was lanced with a scalpel today and some bloody purulent fluid was extracted. A sample will be sent for culture. Treat with Keflex for 7 days.  [name], MD

## 2016-06-11 NOTE — Progress Notes (Signed)
Pre visit review using our clinic review tool, if applicable. No additional management support is needed unless otherwise documented below in the visit note. 

## 2016-06-14 LAB — WOUND CULTURE
Gram Stain: NONE SEEN
Organism ID, Bacteria: NO GROWTH

## 2016-06-26 ENCOUNTER — Other Ambulatory Visit: Payer: Self-pay | Admitting: Family Medicine

## 2016-06-26 ENCOUNTER — Other Ambulatory Visit: Payer: Self-pay | Admitting: Physician Assistant

## 2016-06-26 DIAGNOSIS — I4891 Unspecified atrial fibrillation: Secondary | ICD-10-CM

## 2016-06-26 DIAGNOSIS — I5021 Acute systolic (congestive) heart failure: Secondary | ICD-10-CM

## 2016-07-31 ENCOUNTER — Encounter: Payer: Self-pay | Admitting: Family Medicine

## 2016-07-31 ENCOUNTER — Ambulatory Visit (INDEPENDENT_AMBULATORY_CARE_PROVIDER_SITE_OTHER): Payer: BLUE CROSS/BLUE SHIELD | Admitting: Family Medicine

## 2016-07-31 VITALS — BP 160/84 | Temp 98.3°F | Ht 61.0 in | Wt 98.0 lb

## 2016-07-31 DIAGNOSIS — M545 Low back pain, unspecified: Secondary | ICD-10-CM

## 2016-07-31 LAB — POC URINALSYSI DIPSTICK (AUTOMATED)
Bilirubin, UA: NEGATIVE
Glucose, UA: NEGATIVE
KETONES UA: NEGATIVE
Leukocytes, UA: NEGATIVE
Nitrite, UA: NEGATIVE
PROTEIN UA: NEGATIVE
RBC UA: NEGATIVE
Spec Grav, UA: >=1.03 — AB (ref 1.010–1.025)
UROBILINOGEN UA: 0.2 U/dL
pH, UA: 6 (ref 5.0–8.0)

## 2016-07-31 MED ORDER — CYCLOBENZAPRINE HCL 10 MG PO TABS: 10.0000 mg | ORAL_TABLET | Freq: Three times a day (TID) | ORAL | 1 refills | Status: DC | PRN

## 2016-07-31 NOTE — Patient Instructions (Signed)
WE NOW OFFER   [facility] FAST TRACK!!!  SAME DAY Appointments for ACUTE CARE  Such as: Sprains, Injuries, cuts, abrasions, rashes, muscle pain, joint pain, back pain Colds, flu, sore throats, headache, allergies, cough, fever  Ear pain, sinus and eye infections Abdominal pain, nausea, vomiting, diarrhea, upset stomach Animal/insect bites  3 Easy Ways to Schedule: Walk-In Scheduling Call in scheduling Mychart Sign-up: [email or web address]         

## 2016-07-31 NOTE — Progress Notes (Signed)
   Subjective:    Patient ID: Mackenzie [name], female    DOB: [birth year], 66 y.o.   MRN: [id number]  HPI Here for 2 weeks of intermittent aching pains in the left middle back area. No trauma hx. No urinary or bowel changes. No fever. Using Tylenol and heat.    Review of Systems  Constitutional: Negative.   Respiratory: Negative.   Cardiovascular: Negative.   Gastrointestinal: Negative.   Genitourinary: Negative.   Musculoskeletal: Positive for back pain.       Objective:   Physical Exam  Constitutional: She appears well-developed. No distress.  Cardiovascular: Normal rate, regular rhythm, normal heart sounds and intact distal pulses.   Pulmonary/Chest: Effort normal and breath sounds normal.  Abdominal: Soft. Bowel sounds are normal. She exhibits no distension and no mass. There is no tenderness. There is no rebound and no guarding.  Musculoskeletal:  Mildly tender in the left middle back. Full ROM           Assessment & Plan:  Back pain, try Flexeril prn. Recheck if not better in one week.  [name], MD

## 2016-07-31 NOTE — Addendum Note (Signed)
Addended by: Aggie Hacker A on: 07/31/2016 09:24 AM   Modules accepted: Orders

## 2016-07-31 NOTE — Progress Notes (Signed)
Pre visit review using our clinic review tool, if applicable. No additional management support is needed unless otherwise documented below in the visit note. 

## 2016-08-08 DIAGNOSIS — R1310 Dysphagia, unspecified: Secondary | ICD-10-CM | POA: Diagnosis not present

## 2016-08-08 DIAGNOSIS — K222 Esophageal obstruction: Secondary | ICD-10-CM | POA: Diagnosis not present

## 2016-08-12 ENCOUNTER — Other Ambulatory Visit: Payer: Self-pay | Admitting: Family Medicine

## 2016-08-13 ENCOUNTER — Encounter: Payer: Self-pay | Admitting: Cardiology

## 2016-08-13 NOTE — Telephone Encounter (Signed)
Call in #[phone] with 5 rf 

## 2016-08-14 ENCOUNTER — Telehealth: Payer: Self-pay | Admitting: Cardiology

## 2016-08-14 NOTE — Telephone Encounter (Signed)
She is cleared for endoscopy. May hold Xarelto for 48 hours prior.   Peter Martinique MD, [facility]

## 2016-08-14 NOTE — Telephone Encounter (Signed)
Pt most recently seen in office 03/01/16. No letter pertaining to clearance in system. Routed to provider to review/advise.

## 2016-08-14 NOTE — Telephone Encounter (Signed)
New message    Request for surgical clearance:  1. What type of surgery is being performed? Esophagoscopy with balloon dilation   2. When is this surgery scheduled? Not scheduled yet. Maybe Monday.  3. Are there any medications that need to be held prior to surgery and how long? Xarelto-hold 2 days prior to surgery.  4. Name of physician performing surgery? Dr. Redmond Baseman  What is your office phone and fax number? [phone]

## 2016-08-14 NOTE — Telephone Encounter (Signed)
Note faxed back to [facility] at fax # [phone].

## 2016-08-15 ENCOUNTER — Encounter (HOSPITAL_COMMUNITY): Payer: Self-pay | Admitting: *Deleted

## 2016-08-15 NOTE — Progress Notes (Addendum)
Mackenzie [name] denies chest pain or shortness of breath at rest. Patient's PCP is Dr Peter Martinique.  Mackenzie [name] had n echo 05/10/15 which had an EF of 55- 60 which was up from EF from stress test done 02/11/15.  Mackenzie [name] takes her medication crushed in pudding, so he will not be able to take beta blocker prior to surgery.

## 2016-08-16 ENCOUNTER — Other Ambulatory Visit: Payer: Self-pay | Admitting: Otolaryngology

## 2016-08-16 NOTE — Progress Notes (Signed)
Anesthesia chart review: Same day workup.  Patient is a 66 year old female scheduled for esophageal dilation on 08/19/2016 by Dr. Redmond Baseman.  History includes never smoker, GERD, esophageal stricture, osteoporosis, exertional dyspnea, paroxysmal atrial fibrillation s/p DCCV 02/07/84, chronic systolic CHF (felt likely tachycardia induced; EF 25-30% 01/2015 and 55-60% 04/2015).   PCP is Dr. [name].  Cardiologist is Dr. Peter Martinique. On 08/14/16 he wrote, "She is cleared for endoscopy. May hold Xarelto for 48 hours prior."  Meds include Xarelto, amiodarone, Flexeril (not started), Prevacid, losartan, metoprolol, Restoril.   EKG 10/19/15 ([facility]): SB at 54 bpm, moderate voltage criteria for LVH, may be normal variant. ST and T-wave abnormality, consider inferior ischemia. Overall, I think tracing is similar to 03/20/15.   Echo 05/10/15: Study Conclusions - Left ventricle: The cavity size was normal. Wall thickness was   normal. Systolic function was normal. The estimated ejection   fraction was in the range of 55% to 60%. Wall motion was normal;   there were no regional wall motion abnormalities. Doppler   parameters are consistent with psuedonormal left ventricular   relaxation (grade 2 diastolic dysfunction). The E/A ratio is   >1.5. The E/e&' ratio is >20, suggesting markedly elevated LV   filling pressure. - Mitral valve: Mildly thickened leaflets . - Left atrium: The atrium was normal in size. - Right ventricle: The cavity size was normal. Wall thickness was   normal. Systolic function is reduced. Lateral annulus peak S   velocity: 8.79 cm/s. - Atrial septum: No defect or patent foramen ovale was identified. - Tricuspid valve: There was mild regurgitation. - Inferior vena cava: The vessel was normal in size. The   respirophasic diameter changes were in the normal range (= 50%),   consistent with normal central venous pressure. Impressions: - Compared to a prior study in 01/2015,  the EF has normalized.   There is Grade 2 diastolic dysfunction with elevated LV filling   pressure.  Nuclear stress test 02/11/15: IMPRESSION: 1. No reversible ischemia or infarction. Small, mild fixed defect along the anterior apex. 2. Global hypokinesis. 3.  Decreased left ventricular ejection fraction of 21%. 4. High-risk stress test findings*.  She is for labs on arrival. She takes her medications in pudding, so she can not take her b-blocker on the morning of surgery. Further evaluation by her anesthesiologist on the day of surgery to ensure no acute changes. He/she can consider IV b-blocker.   George Hugh [facility]/Anesthesiology Phone [phone] 08/16/2016 11:57 AM

## 2016-08-19 ENCOUNTER — Encounter (HOSPITAL_COMMUNITY): Payer: Self-pay | Admitting: Certified Registered Nurse Anesthetist

## 2016-08-19 ENCOUNTER — Ambulatory Visit (HOSPITAL_COMMUNITY)
Admission: RE | Admit: 2016-08-19 | Discharge: 2016-08-19 | Disposition: A | Discharge status: Home / Self Care | Payer: BLUE CROSS/BLUE SHIELD | Source: Ambulatory Visit | Attending: Otolaryngology | Admitting: Otolaryngology

## 2016-08-19 ENCOUNTER — Ambulatory Visit (HOSPITAL_COMMUNITY): Payer: BLUE CROSS/BLUE SHIELD | Admitting: Vascular Surgery

## 2016-08-19 ENCOUNTER — Encounter (HOSPITAL_COMMUNITY)
Admission: RE | Disposition: A | Discharge status: Home / Self Care | Payer: Self-pay | Source: Ambulatory Visit | Attending: Otolaryngology

## 2016-08-19 DIAGNOSIS — K222 Esophageal obstruction: Secondary | ICD-10-CM | POA: Insufficient documentation

## 2016-08-19 DIAGNOSIS — Z7901 Long term (current) use of anticoagulants: Secondary | ICD-10-CM | POA: Insufficient documentation

## 2016-08-19 DIAGNOSIS — I480 Paroxysmal atrial fibrillation: Secondary | ICD-10-CM | POA: Insufficient documentation

## 2016-08-19 DIAGNOSIS — I509 Heart failure, unspecified: Secondary | ICD-10-CM | POA: Diagnosis not present

## 2016-08-19 DIAGNOSIS — R1310 Dysphagia, unspecified: Secondary | ICD-10-CM | POA: Diagnosis not present

## 2016-08-19 DIAGNOSIS — Z79899 Other long term (current) drug therapy: Secondary | ICD-10-CM | POA: Insufficient documentation

## 2016-08-19 DIAGNOSIS — I10 Essential (primary) hypertension: Secondary | ICD-10-CM | POA: Diagnosis not present

## 2016-08-19 DIAGNOSIS — K219 Gastro-esophageal reflux disease without esophagitis: Secondary | ICD-10-CM | POA: Diagnosis not present

## 2016-08-19 HISTORY — DX: Pneumonia, unspecified organism: J18.9

## 2016-08-19 HISTORY — DX: Dyspnea, unspecified: R06.00

## 2016-08-19 HISTORY — DX: Cardiac arrhythmia, unspecified: I49.9

## 2016-08-19 HISTORY — PX: ESOPHAGEAL DILATION: SHX303

## 2016-08-19 LAB — BASIC METABOLIC PANEL
Anion gap: 6 (ref 5–15)
BUN: 10 mg/dL (ref 6–20)
CALCIUM: 8.9 mg/dL (ref 8.9–10.3)
CO2: 27 mmol/L (ref 22–32)
CREATININE: 1 mg/dL (ref 0.44–1.00)
Chloride: 104 mmol/L (ref 101–111)
GFR calc Af Amer: >60 mL/min (ref >60)
GFR, EST NON AFRICAN AMERICAN: 57 mL/min — AB (ref >60)
GLUCOSE: 98 mg/dL (ref 65–99)
Potassium: 3.7 mmol/L (ref 3.5–5.1)
Sodium: 137 mmol/L (ref 135–145)

## 2016-08-19 LAB — CBC
HCT: 39.7 % (ref 36.0–46.0)
Hemoglobin: 12.4 g/dL (ref 12.0–15.0)
MCH: 28 pg (ref 26.0–34.0)
MCHC: 31.2 g/dL (ref 30.0–36.0)
MCV: 89.6 fL (ref 78.0–100.0)
PLATELETS: 213 10*3/uL (ref 150–400)
RBC: 4.43 MIL/uL (ref 3.87–5.11)
RDW: 13.1 % (ref 11.5–15.5)
WBC: 4.6 10*3/uL (ref 4.0–10.5)

## 2016-08-19 LAB — PROTIME-INR
INR: 1
PROTHROMBIN TIME: 13.2 s (ref 11.4–15.2)

## 2016-08-19 SURGERY — DILATION, ESOPHAGUS
Anesthesia: General | Site: Throat

## 2016-08-19 MED ORDER — PROMETHAZINE HCL 25 MG/ML IJ SOLN
6.2500 mg | INTRAMUSCULAR | Status: DC | PRN
Start: 2016-08-19 — End: 2016-08-19

## 2016-08-19 MED ORDER — MEPERIDINE HCL 25 MG/ML IJ SOLN: 6.2500 mg | INTRAMUSCULAR | Status: DC | PRN

## 2016-08-19 MED ORDER — MIDAZOLAM HCL 2 MG/2ML IJ SOLN
INTRAMUSCULAR | Status: AC
  Filled 2016-08-19: qty 2

## 2016-08-19 MED ORDER — FENTANYL CITRATE (PF) 100 MCG/2ML IJ SOLN
INTRAMUSCULAR | Status: DC | PRN
  Administered 2016-08-19: 100 ug via INTRAVENOUS

## 2016-08-19 MED ORDER — MIDAZOLAM HCL 5 MG/5ML IJ SOLN
INTRAMUSCULAR | Status: DC | PRN
  Administered 2016-08-19: 1 mg via INTRAVENOUS

## 2016-08-19 MED ORDER — ONDANSETRON HCL 4 MG/2ML IJ SOLN
INTRAMUSCULAR | Status: DC | PRN
  Administered 2016-08-19: 4 mg via INTRAVENOUS

## 2016-08-19 MED ORDER — OXYCODONE HCL 5 MG/5ML PO SOLN: 5.0000 mg | Freq: Once | ORAL | Status: DC | PRN

## 2016-08-19 MED ORDER — EPINEPHRINE HCL (NASAL) 0.1 % NA SOLN
NASAL | Status: DC | PRN
  Administered 2016-08-19: 30 mL via TOPICAL

## 2016-08-19 MED ORDER — EPINEPHRINE HCL (NASAL) 0.1 % NA SOLN
NASAL | Status: AC
  Filled 2016-08-19: qty 30

## 2016-08-19 MED ORDER — PHENYLEPHRINE HCL 10 MG/ML IJ SOLN
INTRAVENOUS | Status: DC | PRN
  Administered 2016-08-19: 40 ug/min via INTRAVENOUS

## 2016-08-19 MED ORDER — ONDANSETRON HCL 4 MG/2ML IJ SOLN
INTRAMUSCULAR | Status: AC
  Filled 2016-08-19: qty 2

## 2016-08-19 MED ORDER — HYDROMORPHONE HCL 1 MG/ML IJ SOLN: 0.2500 mg | INTRAMUSCULAR | Status: DC | PRN

## 2016-08-19 MED ORDER — PROPOFOL 10 MG/ML IV BOLUS
INTRAVENOUS | Status: DC | PRN
  Administered 2016-08-19: 150 mg via INTRAVENOUS

## 2016-08-19 MED ORDER — OXYCODONE HCL 5 MG PO TABS: 5.0000 mg | ORAL_TABLET | Freq: Once | ORAL | Status: DC | PRN

## 2016-08-19 MED ORDER — FENTANYL CITRATE (PF) 250 MCG/5ML IJ SOLN
INTRAMUSCULAR | Status: AC
  Filled 2016-08-19: qty 5

## 2016-08-19 MED ORDER — ROCURONIUM BROMIDE 100 MG/10ML IV SOLN
INTRAVENOUS | Status: DC | PRN
  Administered 2016-08-19: 30 mg via INTRAVENOUS

## 2016-08-19 MED ORDER — LACTATED RINGERS IV SOLN
INTRAVENOUS | Status: DC
  Administered 2016-08-19 (×2): via INTRAVENOUS

## 2016-08-19 MED ORDER — PROPOFOL 10 MG/ML IV BOLUS
INTRAVENOUS | Status: AC
  Filled 2016-08-19: qty 20

## 2016-08-19 MED ORDER — SUCCINYLCHOLINE CHLORIDE 200 MG/10ML IV SOSY
PREFILLED_SYRINGE | INTRAVENOUS | Status: AC
  Filled 2016-08-19: qty 10

## 2016-08-19 MED ORDER — ETOMIDATE 2 MG/ML IV SOLN
INTRAVENOUS | Status: AC
  Filled 2016-08-19: qty 10

## 2016-08-19 MED ORDER — SUGAMMADEX SODIUM 200 MG/2ML IV SOLN
INTRAVENOUS | Status: DC | PRN
  Administered 2016-08-19: 200 mg via INTRAVENOUS

## 2016-08-19 MED ORDER — EPHEDRINE 5 MG/ML INJ
INTRAVENOUS | Status: AC
  Filled 2016-08-19: qty 10

## 2016-08-19 MED ORDER — LIDOCAINE HCL (CARDIAC) 20 MG/ML IV SOLN
INTRAVENOUS | Status: DC | PRN
  Administered 2016-08-19: 60 mg via INTRAVENOUS

## 2016-08-19 MED ORDER — 0.9 % SODIUM CHLORIDE (POUR BTL) OPTIME
TOPICAL | Status: DC | PRN
  Administered 2016-08-19: 1000 mL

## 2016-08-19 SURGICAL SUPPLY — 30 items
BALLN PULM 15 16.5 18 X 75CM (BALLOONS) ×1
BALLN PULM 15 16.5 18X75 (BALLOONS) ×2
BALLOON PULM 15 16.5 18X75 (BALLOONS) ×1 IMPLANT
BLADE SURG 15 STRL LF DISP TIS (BLADE) IMPLANT
BLADE SURG 15 STRL SS (BLADE)
CANISTER SUCT 3000ML PPV (MISCELLANEOUS) ×3 IMPLANT
CONT SPEC 4OZ CLIKSEAL STRL BL (MISCELLANEOUS) IMPLANT
COVER BACK TABLE 60X90IN (DRAPES) ×3 IMPLANT
COVER MAYO STAND STRL (DRAPES) ×3 IMPLANT
CRADLE DONUT ADULT HEAD (MISCELLANEOUS) IMPLANT
DRAPE HALF SHEET 40X57 (DRAPES) ×3 IMPLANT
GAUZE SPONGE 4X4 12PLY STRL (GAUZE/BANDAGES/DRESSINGS) ×3 IMPLANT
GLOVE BIO SURGEON STRL SZ7.5 (GLOVE) ×3 IMPLANT
GOWN STRL REUS W/ TWL LRG LVL3 (GOWN DISPOSABLE) ×2 IMPLANT
GOWN STRL REUS W/TWL LRG LVL3 (GOWN DISPOSABLE)
GUARD TEETH (MISCELLANEOUS) ×3 IMPLANT
KIT BASIN OR (CUSTOM PROCEDURE TRAY) ×3 IMPLANT
KIT ROOM TURNOVER OR (KITS) ×3 IMPLANT
MARKER SKIN DUAL TIP RULER LAB (MISCELLANEOUS) IMPLANT
NDL HYPO 25GX1X1/2 BEV (NEEDLE) IMPLANT
NEEDLE HYPO 25GX1X1/2 BEV (NEEDLE) IMPLANT
NS IRRIG 1000ML POUR BTL (IV SOLUTION) ×3 IMPLANT
PAD ARMBOARD 7.5X6 YLW CONV (MISCELLANEOUS) ×6 IMPLANT
PATTIES SURGICAL .5 X3 (DISPOSABLE) ×2 IMPLANT
SOLUTION ANTI FOG 6CC (MISCELLANEOUS) IMPLANT
SURGILUBE 2OZ TUBE FLIPTOP (MISCELLANEOUS) ×3 IMPLANT
TOWEL OR 17X24 6PK STRL BLUE (TOWEL DISPOSABLE) ×6 IMPLANT
TUBE CONNECTING 12'X1/4 (SUCTIONS) ×1
TUBE CONNECTING 12X1/4 (SUCTIONS) ×2 IMPLANT
WATER STERILE IRR 1000ML POUR (IV SOLUTION) ×3 IMPLANT

## 2016-08-19 NOTE — Brief Op Note (Signed)
08/19/2016  9:16 AM  PATIENT:  Mackenzie [name]  66 y.o. female  PRE-OPERATIVE DIAGNOSIS:  esophageal stenosis, dysphagia  POST-OPERATIVE DIAGNOSIS:  esophageal stenosis, dysphagia  PROCEDURE:  Procedure(s) with comments: ESOPHAGEAL DILATION (N/A) - esophagoscopy with balloon dilation  SURGEON:  Surgeon(s) and Role:    Melida Quitter, MD - Primary  PHYSICIAN ASSISTANT:   ASSISTANTS: none   ANESTHESIA:   general  EBL:  No intake/output data recorded.  BLOOD ADMINISTERED:none  DRAINS: none   LOCAL MEDICATIONS USED:  NONE  SPECIMEN:  No Specimen  DISPOSITION OF SPECIMEN:  N/A  COUNTS:  YES  TOURNIQUET:  * No tourniquets in log *  DICTATION: .Other Dictation: Dictation Number [id number]  PLAN OF CARE: Discharge to home after PACU  PATIENT DISPOSITION:  PACU - hemodynamically stable.   Delay start of Pharmacological VTE agent (>24hrs) due to surgical blood loss or risk of bleeding: no

## 2016-08-19 NOTE — Anesthesia Preprocedure Evaluation (Deleted)
Anesthesia Evaluation  Patient identified by MRN, date of birth, ID band Patient awake    Reviewed: Allergy & Precautions, H&P , NPO status , Patient's Chart, lab work & pertinent test results  History of Anesthesia Complications Negative for: history of anesthetic complications  Airway Mallampati: I  TM Distance: >3 FB Neck ROM: Full    Dental  (+) Edentulous Upper, Edentulous Lower, Dental Advisory Given   Pulmonary neg pulmonary ROS,    Pulmonary exam normal        Cardiovascular +CHF  Normal cardiovascular exam  Echo 02/10/2015 - Left ventricle: The cavity size was normal. Systolic function wasseverely reduced. The estimated ejection fraction was in therange of 25% to 30%. Diffuse hypokinesis. The study was nottechnically sufficient to allow evaluation of LV diastolicdysfunction due to atrial fibrillation. - Aortic valve: Trileaflet; normal thickness, mildly calcified leaflets. - Mitral valve: There was mild regurgitation. - Left atrium: The atrium was mildly dilated. - Pericardium, extracardiac: A trivial pericardial effusion wasidentified posterior to the heart. There was a small left pleuraleffusion.     Neuro/Psych negative neurological ROS  negative psych ROS   GI/Hepatic Neg liver ROS, GERD  ,  Endo/Other  negative endocrine ROS  Renal/GU negative Renal ROS     Musculoskeletal   Abdominal   Peds  Hematology negative hematology ROS (+)   Anesthesia Other Findings   Reproductive/Obstetrics                             Anesthesia Physical  Anesthesia Plan  ASA: III  Anesthesia Plan: General   Post-op Pain Management:    Induction: Intravenous  Airway Management Planned: Oral ETT  Additional Equipment:   Intra-op Plan:   Post-operative Plan: Extubation in OR  Informed Consent: I have reviewed the patients History and Physical, chart, labs and discussed the  procedure including the risks, benefits and alternatives for the proposed anesthesia with the patient or authorized representative who has indicated his/her understanding and acceptance.   Dental advisory given  Plan Discussed with: CRNA  Anesthesia Plan Comments:         Anesthesia Quick Evaluation

## 2016-08-19 NOTE — Anesthesia Postprocedure Evaluation (Signed)
Anesthesia Post Note  Patient: Mackenzie [name]  Procedure(s) Performed: Procedure(s) (LRB): ESOPHAGEAL DILATION (N/A)  Patient location during evaluation: PACU Anesthesia Type: General Level of consciousness: awake and alert Pain management: pain level controlled Vital Signs Assessment: post-procedure vital signs reviewed and stable Respiratory status: spontaneous breathing, nonlabored ventilation and respiratory function stable Cardiovascular status: blood pressure returned to baseline and stable Postop Assessment: no signs of nausea or vomiting Anesthetic complications: no       Last Vitals:  Vitals:   08/19/16 0939 08/19/16 0945  BP: (!) 170/72 (!) 171/84  Pulse:  (!) 51  Resp: 15 14  Temp: 36.6 C     Last Pain:  Vitals:   08/19/16 0952  PainSc: 0-No pain                 Lynda Rainwater

## 2016-08-19 NOTE — Op Note (Signed)
NAME:  DEMARIS, BOUSQUET                    ACCOUNT NO.:  MEDICAL RECORD NO.:  [id number]  LOCATION:                                 FACILITY:  PHYSICIAN:  Onnie Graham, MD     DATE OF BIRTH:  [birth year]  DATE OF PROCEDURE:  08/19/2016 DATE OF DISCHARGE:                              OPERATIVE REPORT   PREOPERATIVE DIAGNOSIS:  Esophageal stenosis and dysphagia.  POSTOPERATIVE DIAGNOSIS:  Esophageal stenosis and dysphagia.  PROCEDURE:  Esophagoscopy with balloon dilation.  SURGEON:  Onnie Graham, MD.  ANESTHESIA:  General endotracheal anesthesia.  COMPLICATIONS:  None.  INDICATION:  The patient is a 66 year old female with a long history of esophageal stenosis and associated dysphagia, who has required serial dilations.  Her last dilation was in October 2014 and she got good results.  Now, over the past year, she has had gradual worsening of dysphagia again, presents to the operating room for surgical management.  FINDINGS:  The stenotic area was then identified in the upper esophagus in the typical location for her and was about 90% stenosed.  After dilation, the cervical esophagoscope was able to be passed through the area of stenosis and there was a mucosal tear on the left than on the right.  DESCRIPTION OF PROCEDURE:  The patient was identified in the holding room where informed consent having been obtained including discussion of risks, benefits, alternatives, the patient was brought to the operative suite and put on the operative table in supine position.  Anesthesia was induced, and the patient was intubated by the anesthesia team without difficulty.  The eyes were taped closed and bed was turned 90 degrees from anesthesia.  A damp gauze was placed over the upper gum and a cervical esophagoscope was inserted into the upper esophagus keeping the lumen in view until the stenosis was encountered.  The large tracheal dilation balloon was then placed and dilated to 15 mm  for 1 minute and then pulled back.  The stenotic area was examined and there was only a small mucosal tear on the left side.  The balloon was reinserted and inflated to 18 mm for 1 minute and then removed.  The area was again examined and the mucosal tear was seen on the left and on the right. The soft cervical esophagoscope was able to be passed through the area of stenosis and the esophagus was suctioned and carefully examined on the way out.  The esophagoscope was removed as was the damp gauze.  The patient was returned to anesthesia for wake up, was extubated and moved to recovery room in stable condition.     Onnie Graham, MD     DDB/MEDQ  D:  08/19/2016  T:  08/19/2016  Job:  [id number]

## 2016-08-19 NOTE — Transfer of Care (Signed)
Immediate Anesthesia Transfer of Care Note  Patient: WILLISTINE FERRALL  Procedure(s) Performed: Procedure(s) with comments: ESOPHAGEAL DILATION (N/A) - esophagoscopy with balloon dilation  Patient Location: PACU  Anesthesia Type:General  Level of Consciousness: awake, alert , oriented and patient cooperative  Airway & Oxygen Therapy: Patient Spontanous Breathing and Patient connected to nasal cannula oxygen  Post-op Assessment: Report given to RN, Post -op Vital signs reviewed and stable and Patient moving all extremities X 4  Post vital signs: Reviewed and stable  Last Vitals:  Vitals:   08/19/16 0645 08/19/16 0939  BP: (!) 219/98   Pulse: (!) 56   Resp: 20   Temp: 36.7 C 36.6 C    Last Pain: There were no vitals filed for this visit.    Patients Stated Pain Goal: 2 (18/98/42 1031)  Complications: No apparent anesthesia complications

## 2016-08-19 NOTE — Anesthesia Procedure Notes (Signed)
Procedure Name: Intubation Date/Time: 08/19/2016 9:02 AM Performed by: Carney Living Pre-anesthesia Checklist: Patient identified, Emergency Drugs available, Suction available, Patient being monitored and Timeout performed Patient Re-evaluated:Patient Re-evaluated prior to inductionOxygen Delivery Method: Circle system utilized Preoxygenation: Pre-oxygenation with 100% oxygen Intubation Type: IV induction Ventilation: Mask ventilation without difficulty Laryngoscope Size: Mac and 4 Grade View: Grade I Tube type: Oral Tube size: 6.0 mm Number of attempts: 1 Airway Equipment and Method: Stylet Placement Confirmation: ETT inserted through vocal cords under direct vision,  positive ETCO2 and breath sounds checked- equal and bilateral Secured at: 20 cm Tube secured with: Tape Dental Injury: Teeth and Oropharynx as per pre-operative assessment

## 2016-08-19 NOTE — H&P (Signed)
Mackenzie [name] is an 66 y.o. female.   Chief Complaint: Esophageal stenosis HPI: 66 year old female with history of esophageal stenosis requiring serial dilation.  Her last dilation was in 10/14.  For the past year, she has noticed gradual worsening of dysphagia and presents for surgical management.  Past Medical History:  Diagnosis Date  . Chronic systolic CHF (congestive heart failure) ([place])    a. 2D ECHO on 02/10/15 w/ severe LV dysfunction with EF 25-30%. Mild MR and mild LAE. negative nuclear stress test, felt to be due to tachycardia   . Dyspnea    with exertion  . Dysrhythmia   . Esophageal stricture    a. requiring dilations. crushes pills to take PO  . Full dentures   . GERD (gastroesophageal reflux disease)   . Osteoporosis    sees Dr. Carrolyn Meiers   . PAF (paroxysmal atrial fibrillation) ([place])    a. s/p succesfful DCCV on 02/13/15  . Pneumonia    (08/15/16)- many years ago  . Sebaceous cyst    on back of neck, has seen Dr. Ronnald Collum    Past Surgical History:  Procedure Laterality Date  . BREAST SURGERY Bilateral    Breast Implants  . CARDIOVERSION N/A 02/13/2015   Procedure: CARDIOVERSION;  Surgeon: Pixie Casino, MD;  Location: [facility];  Service: Cardiovascular;  Laterality: N/A;  . COLONOSCOPY  09/24/2002  . ESOPHAGOGASTRODUODENOSCOPY  06/01/2003   12/13  . ESOPHAGOGASTRODUODENOSCOPY (EGD) WITH ESOPHAGEAL DILATION    . RIGID ESOPHAGOSCOPY N/A 01/18/2013   Procedure: RIGID ESOPHAGOSCOPY WITH ESPHAGEAL Balloon DILATION ;  Surgeon: Melida Quitter, MD;  Location: [facility];  Service: ENT;  Laterality: N/A;  . TEE WITHOUT CARDIOVERSION N/A 02/13/2015   Procedure: TRANSESOPHAGEAL ECHOCARDIOGRAM (TEE);  Surgeon: Pixie Casino, MD;  Location: [facility] ENDOSCOPY;  Service: Cardiovascular;  Laterality: N/A;    Family History  Problem Relation Age of Onset  . Heart attack Mother 42    died from massive heart attack  . Aneurysm Father     cerebral  .  Hypertension Sister   . Diabetes type II Sister   . Heart attack Brother   . Hypertension Sister   . Heart attack Brother   . Stroke Neg Hx    Social History:  reports that she has never smoked. She has never used smokeless tobacco. She reports that she does not drink alcohol or use drugs.  Allergies:  Allergies  Allergen Reactions  . Ramipril Cough    Medications Prior to Admission  Medication Sig Dispense Refill  . amiodarone (PACERONE) 200 MG tablet TAKE 1 TABLET (200 MG TOTAL) BY MOUTH DAILY. (Patient taking differently: Take 100 mg by mouth daily. ) 30 tablet 3  . lansoprazole (PREVACID) 30 MG capsule TAKE 1 CAPSULE (30 MG TOTAL) BY MOUTH DAILY. 90 capsule 3  . losartan (COZAAR) 25 MG tablet TAKE 1 TABLET (25 MG TOTAL) BY MOUTH DAILY. 30 tablet 6  . metoprolol tartrate (LOPRESSOR) 25 MG tablet TAKE 0.5 TABLETS (12.5 MG TOTAL) BY MOUTH 2 (TWO) TIMES DAILY. 30 tablet 10  . temazepam (RESTORIL) 30 MG capsule TAKE ONE CAPSULE BY MOUTH AT BEDTIME AS NEEDED FOR SLEEP (Patient taking differently: Take 54ms by mouth at bedtime as needed for sleep) 30 capsule 5  . cyclobenzaprine (FLEXERIL) 10 MG tablet Take 1 tablet (10 mg total) by mouth 3 (three) times daily as needed for muscle spasms. 60 tablet 1  . rivaroxaban (XARELTO) 20 MG TABS tablet Take  1 tablet (20 mg total) by mouth daily with supper. 90 tablet 3    Results for orders placed or performed during the hospital encounter of 08/19/16 (from the past 48 hour(s))  Basic metabolic panel     Status: Abnormal   Collection Time: 08/19/16  6:45 AM  Result Value Ref Range   Sodium 137 135 - 145 mmol/L   Potassium 3.7 3.5 - 5.1 mmol/L   Chloride 104 101 - 111 mmol/L   CO2 27 22 - 32 mmol/L   Glucose, Bld 98 65 - 99 mg/dL   BUN 10 6 - 20 mg/dL   Creatinine, Ser 1.00 0.44 - 1.00 mg/dL   Calcium 8.9 8.9 - 10.3 mg/dL   GFR calc non Af Amer 57 (L) >60 mL/min   GFR calc Af Amer >60 >60 mL/min    Comment: (NOTE) The eGFR has been  calculated using the CKD EPI equation. This calculation has not been validated in all clinical situations. eGFR's persistently <60 mL/min signify possible Chronic Kidney Disease.    Anion gap 6 5 - 15  CBC     Status: None   Collection Time: 08/19/16  6:45 AM  Result Value Ref Range   WBC 4.6 4.0 - 10.5 K/uL   RBC 4.43 3.87 - 5.11 MIL/uL   Hemoglobin 12.4 12.0 - 15.0 g/dL   HCT 39.7 36.0 - 46.0 %   MCV 89.6 78.0 - 100.0 fL   MCH 28.0 26.0 - 34.0 pg   MCHC 31.2 30.0 - 36.0 g/dL   RDW 13.1 11.5 - 15.5 %   Platelets 213 150 - 400 K/uL  PT- INR Day of Surgery     Status: None   Collection Time: 08/19/16  6:45 AM  Result Value Ref Range   Prothrombin Time 13.2 11.4 - 15.2 seconds   INR 1.00    No results found.  Review of Systems  All other systems reviewed and are negative.   Blood pressure (!) 219/98, pulse (!) 56, temperature 98.1 F (36.7 C), resp. rate 20, height _0  (1.549 m), weight 98 lb (44.5 kg), SpO2 100 %. Physical Exam  Constitutional: She is oriented to person, place, and time. She appears well-developed and well-nourished. No distress.  HENT:  Head: Normocephalic and atraumatic.  Right Ear: External ear normal.  Left Ear: External ear normal.  Nose: Nose normal.  Mouth/Throat: Oropharynx is clear and moist.  Eyes: Conjunctivae and EOM are normal. Pupils are equal, round, and reactive to light.  Neck: Normal range of motion. Neck supple.  Cardiovascular: Normal rate.   Respiratory: Effort normal.  Musculoskeletal: Normal range of motion.  Neurological: She is alert and oriented to person, place, and time. No cranial nerve deficit.  Skin: Skin is warm and dry.  Psychiatric: She has a normal mood and affect. Her behavior is normal. Judgment and thought content normal.     Assessment/Plan Esophageal stenosis To OR for esophagoscopy with dilation.  Melida Quitter, MD 08/19/2016, 8:44 AM

## 2016-08-19 NOTE — Progress Notes (Signed)
Dr Sabra Heck called and informed of bp readings 219/98 in right arm and 234/78 in left arm.

## 2016-08-19 NOTE — Anesthesia Preprocedure Evaluation (Addendum)
Anesthesia Evaluation  Patient identified by MRN, date of birth, ID band Patient awake    Reviewed: NPO status , Patient's Chart, lab work & pertinent test results, reviewed documented beta blocker date and time   Airway Mallampati: I  TM Distance: >3 FB Neck ROM: Full    Dental no notable dental hx. (+) Edentulous Upper, Edentulous Lower, Dental Advisory Given   Pulmonary    Pulmonary exam normal breath sounds clear to auscultation       Cardiovascular hypertension, Pt. on medications and Pt. on home beta blockers +CHF  Normal cardiovascular exam+ dysrhythmias Atrial Fibrillation  Rhythm:Regular Rate:Normal  Transthoracic Echocardiography 05/10/2015   Left ventricle:  The cavity size was normal. Wall thickness was normal. Systolic function was normal. The estimated ejection fraction was in the range of 55% to 60%. Wall motion was normal; there were no regional wall motion abnormalities. Doppler parameters are consistent with psuedonormal left ventricular relaxation (grade 2 diastolic dysfunction). The E/A ratio is >1.5. The E/e&' ratio is >20, suggesting markedly elevated LV filling pressure.    Neuro/Psych    GI/Hepatic Medicated,  Endo/Other    Renal/GU      Musculoskeletal   Abdominal   Peds  Hematology   Anesthesia Other Findings   Reproductive/Obstetrics                           Anesthesia Physical Anesthesia Plan  ASA: III  Anesthesia Plan: General   Post-op Pain Management:    Induction:   Airway Management Planned: Oral ETT  Additional Equipment:   Intra-op Plan:   Post-operative Plan: Extubation in OR  Informed Consent: I have reviewed the patients History and Physical, chart, labs and discussed the procedure including the risks, benefits and alternatives for the proposed anesthesia with the patient or authorized representative who has indicated his/her  understanding and acceptance.   Dental advisory given  Plan Discussed with: CRNA, Anesthesiologist and Surgeon  Anesthesia Plan Comments:        Anesthesia Quick Evaluation

## 2016-08-20 ENCOUNTER — Encounter (HOSPITAL_COMMUNITY): Payer: Self-pay | Admitting: Otolaryngology

## 2016-08-30 ENCOUNTER — Ambulatory Visit (INDEPENDENT_AMBULATORY_CARE_PROVIDER_SITE_OTHER): Payer: BLUE CROSS/BLUE SHIELD | Admitting: Cardiology

## 2016-08-30 ENCOUNTER — Encounter: Payer: Self-pay | Admitting: Cardiology

## 2016-08-30 VITALS — BP 136/92 | HR 62 | Ht 61.0 in | Wt 99.2 lb

## 2016-08-30 DIAGNOSIS — I480 Paroxysmal atrial fibrillation: Secondary | ICD-10-CM

## 2016-08-30 DIAGNOSIS — I4891 Unspecified atrial fibrillation: Secondary | ICD-10-CM | POA: Diagnosis not present

## 2016-08-30 DIAGNOSIS — Z7901 Long term (current) use of anticoagulants: Secondary | ICD-10-CM

## 2016-08-30 NOTE — Progress Notes (Signed)
Cardiology Office Note   Date:  08/30/2016   ID:  Mackenzie [name] Sep 04, 1950, MRN [id number]  PCP:  Laurey Morale, MD  Cardiologist:  Dr. Martinique     History of Present Illness: Mackenzie [name] is a 66 y.o. female seen for follow up Afib. she has a history of HTN, GERD, and esophageal stricture with previous dilations. She has a history of Afib and is on chronic amiodarone.  She presented to [facility] on 02/09/15 with SOB and palpitations. She was found to have new onset atrial fibrillation w/ RVR w/ evidence of CHF and mildly elevated troponin.   A 2D ECHO was ordered which revealed severe LV dysfunction with EF 25-30%. Mild MR and mild LAE. She underwent a nuclear stress test on 02/11/15 which was negative for ischemia. EF 21%. Her severe LV dysfunction was felt to be due to tachycardia and restoration of NSR important. She underwent successful TEE/DCCV on 02/13/15.  On 02/22/15 she was noted to be in recurrent atrial fibrillation w/ CVR with minimal sx aside from fatigue. She was started on amiodarone 200mg  BID and scheduled for follow up with  with plans for DCCV if she remained in afib. On subsequent follow up she had converted to NSR. Echo in Jan. 2017 showed normal LV function with EF 55-60%.   She did recently undergo esophageal dilitation for stricture on 08/19/16.   On follow up today she is feeling very well. No chest pain,  dizziness, dyspnea, or edema. Sometimes when she lies down at night she feels her body jerk and maybe her heart beating hard. Swallowing has improved with recent procedure. She works 2 days a week in housekeeping. She is monitoring her BP at home with widely varying readings from 16-967 systolic.   Past Medical History:  Diagnosis Date  . Chronic systolic CHF (congestive heart failure) ([place])    a. 2D ECHO on 02/10/15 w/ severe LV dysfunction with EF 25-30%. Mild MR and mild LAE. negative nuclear stress test, felt to be due to tachycardia   . Dyspnea    with  exertion  . Dysrhythmia   . Esophageal stricture    a. requiring dilations. crushes pills to take PO  . Full dentures   . GERD (gastroesophageal reflux disease)   . Osteoporosis    sees Dr. Carrolyn Meiers   . PAF (paroxysmal atrial fibrillation) ([place])    a. s/p succesfful DCCV on 02/13/15  . Pneumonia    (08/15/16)- many years ago  . Sebaceous cyst    on back of neck, has seen Dr. Ronnald Collum    Past Surgical History:  Procedure Laterality Date  . BREAST SURGERY Bilateral    Breast Implants  . CARDIOVERSION N/A 02/13/2015   Procedure: CARDIOVERSION;  Surgeon: Pixie Casino, MD;  Location: [facility];  Service: Cardiovascular;  Laterality: N/A;  . COLONOSCOPY  09/24/2002  . ESOPHAGEAL DILATION N/A 08/19/2016   Procedure: ESOPHAGEAL DILATION;  Surgeon: Melida Quitter, MD;  Location: [facility];  Service: ENT;  Laterality: N/A;  esophagoscopy with balloon dilation  . ESOPHAGOGASTRODUODENOSCOPY  06/01/2003   12/13  . ESOPHAGOGASTRODUODENOSCOPY (EGD) WITH ESOPHAGEAL DILATION    . RIGID ESOPHAGOSCOPY N/A 01/18/2013   Procedure: RIGID ESOPHAGOSCOPY WITH ESPHAGEAL Balloon DILATION ;  Surgeon: Melida Quitter, MD;  Location: [facility];  Service: ENT;  Laterality: N/A;  . TEE WITHOUT CARDIOVERSION N/A 02/13/2015   Procedure: TRANSESOPHAGEAL ECHOCARDIOGRAM (TEE);  Surgeon: Pixie Casino, MD;  Location: [facility] ENDOSCOPY;  Service: Cardiovascular;  Laterality: N/A;     Current Outpatient Prescriptions  Medication Sig Dispense Refill  . amiodarone (PACERONE) 200 MG tablet TAKE 1 TABLET (200 MG TOTAL) BY MOUTH DAILY. (Patient taking differently: Take 100 mg by mouth daily. ) 30 tablet 3  . cyclobenzaprine (FLEXERIL) 10 MG tablet Take 1 tablet (10 mg total) by mouth 3 (three) times daily as needed for muscle spasms. 60 tablet 1  . lansoprazole (PREVACID) 30 MG capsule TAKE 1 CAPSULE (30 MG TOTAL) BY MOUTH DAILY. 90 capsule 3  . losartan (COZAAR) 25 MG tablet TAKE 1 TABLET (25 MG TOTAL) BY  MOUTH DAILY. 30 tablet 6  . metoprolol tartrate (LOPRESSOR) 25 MG tablet TAKE 0.5 TABLETS (12.5 MG TOTAL) BY MOUTH 2 (TWO) TIMES DAILY. 30 tablet 10  . rivaroxaban (XARELTO) 20 MG TABS tablet Take 1 tablet (20 mg total) by mouth daily with supper. 90 tablet 3  . temazepam (RESTORIL) 30 MG capsule TAKE ONE CAPSULE BY MOUTH AT BEDTIME AS NEEDED FOR SLEEP (Patient taking differently: Take 15mg s by mouth at bedtime as needed for sleep) 30 capsule 5   No current facility-administered medications for this visit.     Allergies:   Ramipril    Social History:  The patient  reports that she has never smoked. She has never used smokeless tobacco. She reports that she does not drink alcohol or use drugs.   Family History:  The patient's family history includes Aneurysm in her father; Diabetes type II in her sister; Heart attack in her brother and brother; Heart attack (age of onset: 36) in her mother; Hypertension in her sister and sister.    ROS:  Please see the history of present illness.   Otherwise, review of systems are positive for NONE.   All other systems are reviewed and negative.    PHYSICAL EXAM: VS:  BP (!) 136/92   Pulse 62   Ht 5\' 1"  (1.549 m)   Wt 99 lb 3.2 oz (45 kg)   BMI 18.74 kg/m  , BMI Body mass index is 18.74 kg/m. GEN: Well nourished, well developed, in no acute distress  HEENT: normal  Neck: no JVD, carotid bruits, or masses Cardiac: RRR; no murmurs, rubs, or gallops,no edema  Respiratory:  clear to auscultation bilaterally, normal work of breathing GI: soft, nontender, nondistended, + BS MS: no deformity or atrophy  Skin: warm and dry, no rash Neuro:  Strength and sensation are intact Psych: euthymic mood, full affect   EKG:  EKG is not ordered today.   Recent Labs: 02/22/2016: ALT 12; TSH 2.24 08/19/2016: BUN 10; Creatinine, Ser 1.00; Hemoglobin 12.4; Platelets 213; Potassium 3.7; Sodium 137    Lipid Panel    Component Value Date/Time   CHOL 157 02/10/2015  0126   TRIG 61 02/10/2015 0126   HDL 53 02/10/2015 0126   CHOLHDL 3.0 02/10/2015 0126   VLDL 12 02/10/2015 0126   LDLCALC 92 02/10/2015 0126   LDLDIRECT 121.0 03/14/2010 0931      Wt Readings from Last 3 Encounters:  08/30/16 99 lb 3.2 oz (45 kg)  08/19/16 98 lb (44.5 kg)  07/31/16 98 lb (44.5 kg)       Other studies Reviewed: Additional studies/ records that were reviewed today include: Echo Review of the above records demonstrates:  Echo 05/10/15: Study Conclusions  - Left ventricle: The cavity size was normal. Wall thickness was normal. Systolic function was normal. The estimated ejection fraction was in the range of 55% to  60%. Wall motion was normal; there were no regional wall motion abnormalities. Doppler parameters are consistent with psuedonormal left ventricular relaxation (grade 2 diastolic dysfunction). The E/A ratio is >1.5. The E/e&' ratio is >20, suggesting markedly elevated LV filling pressure. - Mitral valve: Mildly thickened leaflets . - Left atrium: The atrium was normal in size. - Right ventricle: The cavity size was normal. Wall thickness was normal. Systolic function is reduced. Lateral annulus peak S velocity: 8.79 cm/s. - Atrial septum: No defect or patent foramen ovale was identified. - Tricuspid valve: There was mild regurgitation. - Inferior vena cava: The vessel was normal in size. The respirophasic diameter changes were in the normal range (= 50%), consistent with normal central venous pressure.  Impressions:  - Compared to a prior study in 01/2015, the EF has normalized. There is Grade 2 diastolic dysfunction with elevated LV filling pressure.    ASSESSMENT AND PLAN: 1. Atrial fibrillation-paroxysmal.  s/p TEE/DCCV on 02/13/15, but noted to be back in afib in office on 02/22/15. Subsequently converted to NSR on amiodarone.  Continue  amiodarone to 100 mg. She is tolerating well.  -- CHAD-vasc score of 3.  Continue  Xarelto 20mg  for thromboembolic prophylaxis.  2. History of systolic CHF- EF 70-78%, with diffuse HK- felt to be tachycardia mediated.  EF returned to normal with management of her arrhythmia.  -- She has no further s/s CHF. Weight stable today  3.HTN- BP labile on home readings. BP looks ok today. Will continue Rx.     4.GERD- continue PPI  5. Esophageal stricture s/p recent dilation   Current medicines are reviewed at length with the patient today.  The patient does not have concerns regarding medicines.  The following changes have been made: none  Labs/ tests ordered today include:   No orders of the defined types were placed in this encounter.    Disposition:   FU with Dr. Martinique in 6 months   Signed, Hala Narula Martinique, MD  08/30/2016 9:10 AM    [address], [place], [place]  [address] Phone: [phone]; Fax: [phone]

## 2016-08-30 NOTE — Patient Instructions (Signed)
Continue your current therapy  I will see you in 6 months.   

## 2016-10-02 ENCOUNTER — Other Ambulatory Visit: Payer: Self-pay | Admitting: Cardiology

## 2016-10-03 ENCOUNTER — Other Ambulatory Visit: Payer: Self-pay | Admitting: Cardiology

## 2016-10-04 DIAGNOSIS — H52201 Unspecified astigmatism, right eye: Secondary | ICD-10-CM | POA: Diagnosis not present

## 2016-10-04 DIAGNOSIS — Z961 Presence of intraocular lens: Secondary | ICD-10-CM | POA: Diagnosis not present

## 2016-10-26 ENCOUNTER — Other Ambulatory Visit: Payer: Self-pay | Admitting: Cardiology

## 2016-10-26 DIAGNOSIS — I5021 Acute systolic (congestive) heart failure: Secondary | ICD-10-CM

## 2016-10-26 DIAGNOSIS — I4891 Unspecified atrial fibrillation: Secondary | ICD-10-CM

## 2016-11-04 DIAGNOSIS — H01005 Unspecified blepharitis left lower eyelid: Secondary | ICD-10-CM | POA: Diagnosis not present

## 2016-11-04 DIAGNOSIS — H01001 Unspecified blepharitis right upper eyelid: Secondary | ICD-10-CM | POA: Diagnosis not present

## 2017-01-02 ENCOUNTER — Encounter: Payer: Self-pay | Admitting: Family Medicine

## 2017-02-12 NOTE — Progress Notes (Signed)
Cardiology Office Note   Date:  02/18/2017   ID:  Mackenzie, [name] 1951/01/02, MRN [id number]  PCP:  Laurey Morale, MD  Cardiologist:  Dr. Martinique     History of Present Illness: Mackenzie [name] is a 66 y.o. female seen for follow up Afib. she has a history of HTN, GERD, and esophageal stricture with previous dilations. She has a history of Afib and is on chronic amiodarone.  She presented to [facility] on 02/09/15 with SOB and palpitations. She was found to have new onset atrial fibrillation w/ RVR w/ evidence of CHF and mildly elevated troponin.   A 2D ECHO was ordered which revealed severe LV dysfunction with EF 25-30%. Mild MR and mild LAE. She underwent a nuclear stress test on 02/11/15 which was negative for ischemia. EF 21%. Her severe LV dysfunction was felt to be due to tachycardia and restoration of NSR important. She underwent successful TEE/DCCV on 02/13/15.  On 02/22/15 she was noted to be in recurrent atrial fibrillation w/ CVR with minimal sx aside from fatigue. She was started on amiodarone 200mg  BID and scheduled for follow up with  with plans for DCCV if she remained in afib. On subsequent follow up she had converted to NSR. Echo in Jan. 2017 showed normal LV function with EF 55-60%.   She did  undergo esophageal dilitation for stricture on 08/19/16.   On follow up today she is feeling  well. She does note her BP has been consistently high > 408 systolic. She denies any palpitations, chest pain, SOB. Occasional fatigue. Tolerating medication well.  Past Medical History:  Diagnosis Date  . Chronic systolic CHF (congestive heart failure) ([place])    a. 2D ECHO on 02/10/15 w/ severe LV dysfunction with EF 25-30%. Mild MR and mild LAE. negative nuclear stress test, felt to be due to tachycardia   . Dyspnea    with exertion  . Dysrhythmia   . Esophageal stricture    a. requiring dilations. crushes pills to take PO  . Full dentures   . GERD (gastroesophageal reflux disease)   .  Osteoporosis    sees Dr. Carrolyn Meiers   . PAF (paroxysmal atrial fibrillation) ([place])    a. s/p succesfful DCCV on 02/13/15  . Pneumonia    (08/15/16)- many years ago  . Sebaceous cyst    on back of neck, has seen Dr. Ronnald Collum    Past Surgical History:  Procedure Laterality Date  . BREAST SURGERY Bilateral    Breast Implants  . COLONOSCOPY  09/24/2002  . ESOPHAGOGASTRODUODENOSCOPY  06/01/2003   12/13  . ESOPHAGOGASTRODUODENOSCOPY (EGD) WITH ESOPHAGEAL DILATION       Current Outpatient Medications  Medication Sig Dispense Refill  . amiodarone (PACERONE) 200 MG tablet Take 100 mg daily by mouth.    . lansoprazole (PREVACID) 30 MG capsule TAKE 1 CAPSULE (30 MG TOTAL) BY MOUTH DAILY. 90 capsule 3  . metoprolol tartrate (LOPRESSOR) 25 MG tablet TAKE 0.5 TABLETS (12.5 MG TOTAL) BY MOUTH 2 (TWO) TIMES DAILY. 30 tablet 10  . rivaroxaban (XARELTO) 20 MG TABS tablet Take 1 tablet (20 mg total) by mouth daily with supper. 90 tablet 3  . temazepam (RESTORIL) 30 MG capsule TAKE ONE CAPSULE BY MOUTH AT BEDTIME AS NEEDED FOR SLEEP (Patient taking differently: Take 15mg s by mouth at bedtime as needed for sleep) 30 capsule 5  . losartan (COZAAR) 50 MG tablet Take 1 tablet (50 mg total) daily by mouth. 90 tablet  3   No current facility-administered medications for this visit.     Allergies:   Ramipril    Social History:  The patient  reports that  has never smoked. she has never used smokeless tobacco. She reports that she does not drink alcohol or use drugs.   Family History:  The patient's family history includes Aneurysm in her father; Diabetes type II in her sister; Heart attack in her brother and brother; Heart attack (age of onset: 57) in her mother; Hypertension in her sister and sister.    ROS:  Please see the history of present illness.   Otherwise, review of systems are positive for NONE.   All other systems are reviewed and negative.    PHYSICAL EXAM: VS:  BP (!) 182/90   Pulse  (!) 55   Ht 5\' 1"  (1.549 m)   Wt 101 lb (45.8 kg)   BMI 19.08 kg/m  , BMI Body mass index is 19.08 kg/m. GENERAL:  Well appearing WF HEENT:  PERRL, EOMI, sclera are clear. Oropharynx is clear. NECK:  No jugular venous distention, carotid upstroke brisk and symmetric, no bruits, no thyromegaly or adenopathy LUNGS:  Clear to auscultation bilaterally CHEST:  Unremarkable HEART:  RRR,  PMI not displaced or sustained,S1 and S2 within normal limits, no S3, no S4: no clicks, no rubs, no murmurs ABD:  Soft, nontender. BS +, no masses or bruits. No hepatomegaly, no splenomegaly EXT:  2 + pulses throughout, no edema, no cyanosis no clubbing SKIN:  Warm and dry.  No rashes NEURO:  Alert and oriented x 3. Cranial nerves II through XII intact. PSYCH:  Cognitively intact     EKG:  EKG is  ordered today. NSR rate 55. LVH with repolarization abnormality. I have personally reviewed and interpreted this study.    Recent Labs: 02/22/2016: ALT 12; TSH 2.24 08/19/2016: BUN 10; Creatinine, Ser 1.00; Hemoglobin 12.4; Platelets 213; Potassium 3.7; Sodium 137    Lipid Panel    Component Value Date/Time   CHOL 157 02/10/2015 0126   TRIG 61 02/10/2015 0126   HDL 53 02/10/2015 0126   CHOLHDL 3.0 02/10/2015 0126   VLDL 12 02/10/2015 0126   LDLCALC 92 02/10/2015 0126   LDLDIRECT 121.0 03/14/2010 0931      Wt Readings from Last 3 Encounters:  02/18/17 101 lb (45.8 kg)  08/30/16 99 lb 3.2 oz (45 kg)  08/19/16 98 lb (44.5 kg)       Other studies Reviewed: Additional studies/ records that were reviewed today include: Echo Review of the above records demonstrates:  Echo 05/10/15: Study Conclusions  - Left ventricle: The cavity size was normal. Wall thickness was normal. Systolic function was normal. The estimated ejection fraction was in the range of 55% to 60%. Wall motion was normal; there were no regional wall motion abnormalities. Doppler parameters are consistent with psuedonormal  left ventricular relaxation (grade 2 diastolic dysfunction). The E/A ratio is >1.5. The E/e&' ratio is >20, suggesting markedly elevated LV filling pressure. - Mitral valve: Mildly thickened leaflets . - Left atrium: The atrium was normal in size. - Right ventricle: The cavity size was normal. Wall thickness was normal. Systolic function is reduced. Lateral annulus peak S velocity: 8.79 cm/s. - Atrial septum: No defect or patent foramen ovale was identified. - Tricuspid valve: There was mild regurgitation. - Inferior vena cava: The vessel was normal in size. The respirophasic diameter changes were in the normal range (= 50%), consistent with normal central venous pressure.  Impressions:  - Compared to a prior study in 01/2015, the EF has normalized. There is Grade 2 diastolic dysfunction with elevated LV filling pressure.    ASSESSMENT AND PLAN: 1. Atrial fibrillation-paroxysmal.  s/p TEE/DCCV on 02/13/15, but noted to be back in afib in office on 02/22/15. Subsequently converted to NSR on amiodarone. Has maintained NSR for 2 years now.  Continue  amiodarone to 100 mg. She is tolerating well. Will check chemistries and TSH today. -- CHAD-vasc score of 3. Continue  Xarelto 20mg  for thromboembolic prophylaxis.  2. History of systolic CHF- EF 68-11%, with diffuse HK- felt to be tachycardia mediated.  EF returned to normal with management of her arrhythmia.  -- She has no further s/s CHF. Weight stable today  3.HTN- BP consistently high on home readings. Will increase losartan to 50 mg daily. Continue to monitor at home. If BP remains high may need to increase dose further. I don't think she will tolerate increase in beta blocker due to bradycardia.     4.GERD- continue PPI  5. Esophageal stricture s/p recent dilation   Current medicines are reviewed at length with the patient today.  The patient does not have concerns regarding medicines.  The following changes  have been made: increase losartan 50 mg daily  Labs/ tests ordered today include:   Orders Placed This Encounter  Procedures  . EKG 12-Lead  will check CMET, lipids, TSH   Disposition:   FU with Dr. Martinique in 6 months   Signed, Marley Pakula Martinique, MD  02/18/2017 9:45 AM    [address], [place], [place]  [address] Phone: [phone]; Fax: [phone]

## 2017-02-18 ENCOUNTER — Encounter: Payer: Self-pay | Admitting: Cardiology

## 2017-02-18 ENCOUNTER — Ambulatory Visit: Payer: BLUE CROSS/BLUE SHIELD | Admitting: Cardiology

## 2017-02-18 VITALS — BP 182/90 | HR 55 | Ht 61.0 in | Wt 101.0 lb

## 2017-02-18 DIAGNOSIS — I480 Paroxysmal atrial fibrillation: Secondary | ICD-10-CM

## 2017-02-18 DIAGNOSIS — Z7901 Long term (current) use of anticoagulants: Secondary | ICD-10-CM

## 2017-02-18 DIAGNOSIS — I10 Essential (primary) hypertension: Secondary | ICD-10-CM | POA: Diagnosis not present

## 2017-02-18 MED ORDER — LOSARTAN POTASSIUM 50 MG PO TABS: 50.0000 mg | ORAL_TABLET | Freq: Every day | ORAL | 3 refills | Status: DC

## 2017-02-18 NOTE — Patient Instructions (Signed)
Increase losartan to 50 mg daily  Continue your other therapy  We will check blood work today  I will see you in 6 months but call me if your blood pressure is still high.

## 2017-02-18 NOTE — Addendum Note (Signed)
Addended by: Kathyrn Lass on: 02/18/2017 09:53 AM   Modules accepted: Orders

## 2017-02-19 LAB — TSH: TSH: 2.49 u[IU]/mL (ref 0.450–4.500)

## 2017-02-19 LAB — COMPREHENSIVE METABOLIC PANEL
A/G RATIO: 1.4 (ref 1.2–2.2)
ALBUMIN: 4.2 g/dL (ref 3.6–4.8)
ALT: 9 IU/L (ref 0–32)
AST: 19 IU/L (ref 0–40)
Alkaline Phosphatase: 36 IU/L — ABNORMAL LOW (ref 39–117)
BUN/Creatinine Ratio: 18 (ref 12–28)
BUN: 16 mg/dL (ref 8–27)
Bilirubin Total: 0.4 mg/dL (ref 0.0–1.2)
CALCIUM: 9.5 mg/dL (ref 8.7–10.3)
CO2: 24 mmol/L (ref 20–29)
Chloride: 100 mmol/L (ref 96–106)
Creatinine, Ser: 0.89 mg/dL (ref 0.57–1.00)
GFR, EST AFRICAN AMERICAN: 78 mL/min/{1.73_m2} (ref >59)
GFR, EST NON AFRICAN AMERICAN: 67 mL/min/{1.73_m2} (ref >59)
Globulin, Total: 3.1 g/dL (ref 1.5–4.5)
Glucose: 73 mg/dL (ref 65–99)
POTASSIUM: 4.2 mmol/L (ref 3.5–5.2)
Sodium: 139 mmol/L (ref 134–144)
TOTAL PROTEIN: 7.3 g/dL (ref 6.0–8.5)

## 2017-02-19 LAB — LIPID PANEL
CHOL/HDL RATIO: 2.9 ratio (ref 0.0–4.4)
Cholesterol, Total: 207 mg/dL — ABNORMAL HIGH (ref 100–199)
HDL: 72 mg/dL (ref >39)
LDL Calculated: 114 mg/dL — ABNORMAL HIGH (ref 0–99)
Triglycerides: 105 mg/dL (ref 0–149)
VLDL CHOLESTEROL CAL: 21 mg/dL (ref 5–40)

## 2017-02-27 ENCOUNTER — Other Ambulatory Visit: Payer: Self-pay | Admitting: Cardiology

## 2017-02-28 ENCOUNTER — Telehealth: Payer: Self-pay | Admitting: Cardiology

## 2017-02-28 MED ORDER — LOSARTAN POTASSIUM 100 MG PO TABS: 100.0000 mg | ORAL_TABLET | Freq: Every day | ORAL | 6 refills | Status: DC

## 2017-02-28 NOTE — Telephone Encounter (Signed)
Go ahead and increase losartan to 100 mg daily  Auri Jahnke Martinique MD, [facility]

## 2017-02-28 NOTE — Telephone Encounter (Signed)
Returned call to patient Dr.Jordan's recommendations given.Advised to continue to monitor B/P and call back if elevated.

## 2017-02-28 NOTE — Telephone Encounter (Signed)
Mackenzie [name] is calling because Dr.Jordan changed her bp medication and her bp  is still going up . Please call

## 2017-02-28 NOTE — Telephone Encounter (Signed)
Returned call topt she states that she started her Losartan 11-7 and her BP is not getting better. Today  134/100 HR 83 11-15 147/101 HR 78-10am 152/893 HR 78-9pm 11-14 177/90 HR 58-830am 162/75 HR 55-830pm  Pt continues with metoprolol 25mg  BID and all other medications as ordered. Would  you like to change/add anything?

## 2017-04-16 ENCOUNTER — Telehealth: Payer: Self-pay

## 2017-04-16 NOTE — Telephone Encounter (Signed)
Prior authorization submitted for Xarelto.Key # [id number].

## 2017-04-24 ENCOUNTER — Other Ambulatory Visit: Payer: Self-pay | Admitting: Cardiology

## 2017-05-09 DIAGNOSIS — Z961 Presence of intraocular lens: Secondary | ICD-10-CM | POA: Diagnosis not present

## 2017-05-22 ENCOUNTER — Other Ambulatory Visit: Payer: Self-pay | Admitting: Cardiology

## 2017-05-22 DIAGNOSIS — I4891 Unspecified atrial fibrillation: Secondary | ICD-10-CM

## 2017-05-22 DIAGNOSIS — I5021 Acute systolic (congestive) heart failure: Secondary | ICD-10-CM

## 2017-05-22 NOTE — Telephone Encounter (Signed)
REFILL 

## 2017-06-05 DIAGNOSIS — R1310 Dysphagia, unspecified: Secondary | ICD-10-CM | POA: Diagnosis not present

## 2017-06-05 DIAGNOSIS — K222 Esophageal obstruction: Secondary | ICD-10-CM | POA: Diagnosis not present

## 2017-06-11 ENCOUNTER — Other Ambulatory Visit: Payer: Self-pay | Admitting: Otolaryngology

## 2017-06-12 ENCOUNTER — Encounter (HOSPITAL_BASED_OUTPATIENT_CLINIC_OR_DEPARTMENT_OTHER): Payer: Self-pay | Admitting: *Deleted

## 2017-06-12 ENCOUNTER — Telehealth: Payer: Self-pay | Admitting: Cardiology

## 2017-06-12 NOTE — Telephone Encounter (Signed)
New message        [facility] Pre-operative Risk Assessment    Request for surgical clearance:  1. What type of surgery is being performed? Esophageal balloon dilation   When is this surgery scheduled? 3/4  2. What type of clearance is required (medical clearance vs. Pharmacy clearance to hold med vs. Both)? both  3. Are there any medications that need to be held prior to surgery and how long? Xarelto- 2 days  4. Practice name and name of physician performing surgery?  Dr Redmond Baseman  5. What is your office phone and fax number? Fax [phone] Lattie Haw, phone [phone]  6. Anesthesia type (None, local, MAC, general) ? general   Laurier Nancy 06/12/2017, 12:08 PM  _________________________________________________________________   (provider comments below)

## 2017-06-12 NOTE — Telephone Encounter (Signed)
   Primary Cardiologist: Peter Martinique, MD  Chart reviewed as part of pre-operative protocol coverage. Given past medical history and time since last visit, based on ACC/AHA guidelines, SHALAWN WYNDER would be at acceptable risk for the planned procedure without further cardiovascular testing.  She is getting > 4 Mets of activity every day.   Will round to pharmacy to review anticoagulation.    [place], [place] 06/12/2017, 4:02 PM

## 2017-06-12 NOTE — Telephone Encounter (Signed)
Pt takes Xarelto for afib with CHADS2VASc score of 4 (age, sex, CHF, HTN). Renal function is normal. Ok to hold Xarelto for 2 days prior.

## 2017-06-13 NOTE — Telephone Encounter (Signed)
Left detailed message regarding holding anticoagulant 3/2 and 3/3 for procedure on 3/4. Also sent to MyChart. Routed clearance to requesting provider via [place] fax.

## 2017-06-16 ENCOUNTER — Other Ambulatory Visit: Payer: Self-pay

## 2017-06-16 ENCOUNTER — Encounter (HOSPITAL_BASED_OUTPATIENT_CLINIC_OR_DEPARTMENT_OTHER): Payer: Self-pay | Admitting: Anesthesiology

## 2017-06-16 ENCOUNTER — Ambulatory Visit (HOSPITAL_BASED_OUTPATIENT_CLINIC_OR_DEPARTMENT_OTHER)
Admission: RE | Admit: 2017-06-16 | Discharge: 2017-06-16 | Disposition: A | Discharge status: Home / Self Care | Payer: BLUE CROSS/BLUE SHIELD | Source: Ambulatory Visit | Attending: Otolaryngology | Admitting: Otolaryngology

## 2017-06-16 ENCOUNTER — Ambulatory Visit (HOSPITAL_BASED_OUTPATIENT_CLINIC_OR_DEPARTMENT_OTHER): Payer: BLUE CROSS/BLUE SHIELD | Admitting: Anesthesiology

## 2017-06-16 ENCOUNTER — Encounter (HOSPITAL_BASED_OUTPATIENT_CLINIC_OR_DEPARTMENT_OTHER)
Admission: RE | Disposition: A | Discharge status: Home / Self Care | Payer: Self-pay | Source: Ambulatory Visit | Attending: Otolaryngology

## 2017-06-16 DIAGNOSIS — K219 Gastro-esophageal reflux disease without esophagitis: Secondary | ICD-10-CM | POA: Diagnosis not present

## 2017-06-16 DIAGNOSIS — Z833 Family history of diabetes mellitus: Secondary | ICD-10-CM | POA: Diagnosis not present

## 2017-06-16 DIAGNOSIS — I480 Paroxysmal atrial fibrillation: Secondary | ICD-10-CM | POA: Insufficient documentation

## 2017-06-16 DIAGNOSIS — Z8249 Family history of ischemic heart disease and other diseases of the circulatory system: Secondary | ICD-10-CM | POA: Diagnosis not present

## 2017-06-16 DIAGNOSIS — Z79899 Other long term (current) drug therapy: Secondary | ICD-10-CM | POA: Insufficient documentation

## 2017-06-16 DIAGNOSIS — Z888 Allergy status to other drugs, medicaments and biological substances status: Secondary | ICD-10-CM | POA: Insufficient documentation

## 2017-06-16 DIAGNOSIS — R1314 Dysphagia, pharyngoesophageal phase: Secondary | ICD-10-CM | POA: Diagnosis not present

## 2017-06-16 DIAGNOSIS — K222 Esophageal obstruction: Secondary | ICD-10-CM | POA: Diagnosis not present

## 2017-06-16 DIAGNOSIS — Z7901 Long term (current) use of anticoagulants: Secondary | ICD-10-CM | POA: Insufficient documentation

## 2017-06-16 DIAGNOSIS — I5021 Acute systolic (congestive) heart failure: Secondary | ICD-10-CM | POA: Diagnosis not present

## 2017-06-16 DIAGNOSIS — S27818A Other injury of esophagus (thoracic part), initial encounter: Secondary | ICD-10-CM | POA: Diagnosis not present

## 2017-06-16 DIAGNOSIS — I5022 Chronic systolic (congestive) heart failure: Secondary | ICD-10-CM | POA: Diagnosis not present

## 2017-06-16 HISTORY — PX: ESOPHAGOSCOPY WITH DILITATION: SHX5618

## 2017-06-16 SURGERY — ESOPHAGOSCOPY, WITH DILATION
Anesthesia: General | Site: Throat

## 2017-06-16 MED ORDER — MEPERIDINE HCL 25 MG/ML IJ SOLN: 6.2500 mg | INTRAMUSCULAR | Status: DC | PRN

## 2017-06-16 MED ORDER — ROCURONIUM BROMIDE 10 MG/ML (PF) SYRINGE
PREFILLED_SYRINGE | INTRAVENOUS | Status: AC
  Filled 2017-06-16: qty 5

## 2017-06-16 MED ORDER — DEXAMETHASONE SODIUM PHOSPHATE 10 MG/ML IJ SOLN
INTRAMUSCULAR | Status: AC
  Filled 2017-06-16: qty 1

## 2017-06-16 MED ORDER — FENTANYL CITRATE (PF) 100 MCG/2ML IJ SOLN
INTRAMUSCULAR | Status: DC | PRN
  Administered 2017-06-16: 50 ug via INTRAVENOUS

## 2017-06-16 MED ORDER — LIDOCAINE 2% (20 MG/ML) 5 ML SYRINGE
INTRAMUSCULAR | Status: DC | PRN
  Administered 2017-06-16: 60 mg via INTRAVENOUS

## 2017-06-16 MED ORDER — OXYCODONE HCL 5 MG/5ML PO SOLN: 5.0000 mg | Freq: Once | ORAL | Status: DC | PRN

## 2017-06-16 MED ORDER — PROMETHAZINE HCL 25 MG/ML IJ SOLN: 6.2500 mg | INTRAMUSCULAR | Status: DC | PRN

## 2017-06-16 MED ORDER — MIDAZOLAM HCL 2 MG/2ML IJ SOLN: 1.0000 mg | INTRAMUSCULAR | Status: DC | PRN

## 2017-06-16 MED ORDER — SCOPOLAMINE 1 MG/3DAYS TD PT72: 1.0000 | MEDICATED_PATCH | Freq: Once | TRANSDERMAL | Status: DC | PRN

## 2017-06-16 MED ORDER — SUGAMMADEX SODIUM 200 MG/2ML IV SOLN
INTRAVENOUS | Status: AC
  Filled 2017-06-16: qty 2

## 2017-06-16 MED ORDER — FENTANYL CITRATE (PF) 100 MCG/2ML IJ SOLN: 25.0000 ug | INTRAMUSCULAR | Status: DC | PRN

## 2017-06-16 MED ORDER — OXYCODONE HCL 5 MG PO TABS: 5.0000 mg | ORAL_TABLET | Freq: Once | ORAL | Status: DC | PRN

## 2017-06-16 MED ORDER — ROCURONIUM BROMIDE 50 MG/5ML IV SOSY
PREFILLED_SYRINGE | INTRAVENOUS | Status: DC | PRN
  Administered 2017-06-16: 50 mg via INTRAVENOUS

## 2017-06-16 MED ORDER — PROPOFOL 10 MG/ML IV BOLUS
INTRAVENOUS | Status: AC
  Filled 2017-06-16: qty 20

## 2017-06-16 MED ORDER — PROPOFOL 10 MG/ML IV BOLUS
INTRAVENOUS | Status: DC | PRN
  Administered 2017-06-16: 120 mg via INTRAVENOUS

## 2017-06-16 MED ORDER — DEXAMETHASONE SODIUM PHOSPHATE 10 MG/ML IJ SOLN
INTRAMUSCULAR | Status: DC | PRN
  Administered 2017-06-16: 10 mg via INTRAVENOUS

## 2017-06-16 MED ORDER — MIDAZOLAM HCL 2 MG/2ML IJ SOLN
INTRAMUSCULAR | Status: DC | PRN
  Administered 2017-06-16: 1 mg via INTRAVENOUS

## 2017-06-16 MED ORDER — FENTANYL CITRATE (PF) 100 MCG/2ML IJ SOLN: 50.0000 ug | INTRAMUSCULAR | Status: DC | PRN

## 2017-06-16 MED ORDER — ONDANSETRON HCL 4 MG/2ML IJ SOLN
INTRAMUSCULAR | Status: DC | PRN
  Administered 2017-06-16: 4 mg via INTRAVENOUS

## 2017-06-16 MED ORDER — LACTATED RINGERS IV SOLN: INTRAVENOUS | Status: DC

## 2017-06-16 MED ORDER — LACTATED RINGERS IV SOLN
INTRAVENOUS | Status: DC
  Administered 2017-06-16 (×2): via INTRAVENOUS

## 2017-06-16 MED ORDER — LIDOCAINE 2% (20 MG/ML) 5 ML SYRINGE
INTRAMUSCULAR | Status: AC
  Filled 2017-06-16: qty 5

## 2017-06-16 MED ORDER — MIDAZOLAM HCL 2 MG/2ML IJ SOLN
INTRAMUSCULAR | Status: AC
  Filled 2017-06-16: qty 2

## 2017-06-16 MED ORDER — ONDANSETRON HCL 4 MG/2ML IJ SOLN
INTRAMUSCULAR | Status: AC
  Filled 2017-06-16: qty 2

## 2017-06-16 MED ORDER — FENTANYL CITRATE (PF) 100 MCG/2ML IJ SOLN
INTRAMUSCULAR | Status: AC
  Filled 2017-06-16: qty 2

## 2017-06-16 MED ORDER — SUGAMMADEX SODIUM 200 MG/2ML IV SOLN
INTRAVENOUS | Status: DC | PRN
  Administered 2017-06-16: 200 mg via INTRAVENOUS

## 2017-06-16 SURGICAL SUPPLY — 28 items
BALLN PULM 12 13.5 15X75 (BALLOONS)
BALLN PULM 12 13.5 15X75CM (BALLOONS)
BALLN PULM 15 16.5 18 X 75CM (BALLOONS) ×1
BALLN PULM 15 16.5 18X75 (BALLOONS) ×2
BALLN PULMONARY 10 12MM (MISCELLANEOUS)
BALLN PULMONARY 10-12 (MISCELLANEOUS) IMPLANT
BALLN PULMONARY 8 10 OD75CM (MISCELLANEOUS)
BALLN PULMONARY 8-10 OD75 (MISCELLANEOUS) IMPLANT
BALLOON PULM 12 13.5 15X75 (BALLOONS) IMPLANT
BALLOON PULM 15 16.5 18X75 (BALLOONS) IMPLANT
CANISTER SUCT 1200ML W/VALVE (MISCELLANEOUS) ×3 IMPLANT
CONT SPEC 4OZ CLIKSEAL STRL BL (MISCELLANEOUS) IMPLANT
GAUZE SPONGE 4X4 12PLY STRL LF (GAUZE/BANDAGES/DRESSINGS) ×3 IMPLANT
GLOVE BIO SURGEON STRL SZ7.5 (GLOVE) ×3 IMPLANT
GLOVE SURG SS PI 7.0 STRL IVOR (GLOVE) ×2 IMPLANT
GOWN STRL REUS W/ TWL LRG LVL3 (GOWN DISPOSABLE) ×1 IMPLANT
GOWN STRL REUS W/TWL LRG LVL3 (GOWN DISPOSABLE) ×3
GUARD TEETH (MISCELLANEOUS) ×1 IMPLANT
MARKER SKIN DUAL TIP RULER LAB (MISCELLANEOUS) ×3 IMPLANT
NS IRRIG 1000ML POUR BTL (IV SOLUTION) ×3 IMPLANT
PACK BASIN DAY SURGERY FS (CUSTOM PROCEDURE TRAY) ×3 IMPLANT
PATTIES SURGICAL .5 X3 (DISPOSABLE) IMPLANT
SHEET MEDIUM DRAPE 40X70 STRL (DRAPES) ×3 IMPLANT
SURGILUBE 2OZ TUBE FLIPTOP (MISCELLANEOUS) ×3 IMPLANT
SYR INFLATE BILIARY GAUGE (MISCELLANEOUS) ×2 IMPLANT
TOWEL OR 17X24 6PK STRL BLUE (TOWEL DISPOSABLE) ×3 IMPLANT
TUBE CONNECTING 20'X1/4 (TUBING) ×1
TUBE CONNECTING 20X1/4 (TUBING) ×2 IMPLANT

## 2017-06-16 NOTE — Brief Op Note (Signed)
06/16/2017  11:57 AM  PATIENT:  Mackenzie [name]  67 y.o. female  PRE-OPERATIVE DIAGNOSIS:  ESOPHAGEAL DYSPHAGIA, STRICTURE  POST-OPERATIVE DIAGNOSIS:  ESOPHAGEAL DYSPHAGIA, STRICTURE  PROCEDURE:  Procedure(s): ESOPHAGOSCOPY WITH BALLOON DILITATION (N/A)  SURGEON:  Surgeon(s) and Role:    * Melida Quitter, MD - Primary  PHYSICIAN ASSISTANT:   ASSISTANTS: none   ANESTHESIA:   general  EBL: Minimal  BLOOD ADMINISTERED:none  DRAINS: none   LOCAL MEDICATIONS USED:  NONE  SPECIMEN:  No Specimen  DISPOSITION OF SPECIMEN:  N/A  COUNTS:  YES  TOURNIQUET:  * No tourniquets in log *  DICTATION: .Other Dictation: Dictation Number [id number]  PLAN OF CARE: Discharge to home after PACU  PATIENT DISPOSITION:  PACU - hemodynamically stable.   Delay start of Pharmacological VTE agent (>24hrs) due to surgical blood loss or risk of bleeding: no

## 2017-06-16 NOTE — Anesthesia Postprocedure Evaluation (Signed)
Anesthesia Post Note  Patient: Mackenzie [name]  Procedure(s) Performed: ESOPHAGOSCOPY WITH BALLOON DILITATION (N/A Throat)     Patient location during evaluation: PACU Anesthesia Type: General Level of consciousness: awake and alert Pain management: pain level controlled Vital Signs Assessment: post-procedure vital signs reviewed and stable Respiratory status: spontaneous breathing, nonlabored ventilation, respiratory function stable and patient connected to nasal cannula oxygen Cardiovascular status: blood pressure returned to baseline and stable Postop Assessment: no apparent nausea or vomiting Anesthetic complications: no    Last Vitals:  Vitals:   06/16/17 1240 06/16/17 1254  BP: (!) 188/67 (!) 182/79  Pulse: (!) 55 (!) 56  Resp: 20 18  Temp:  36.6 C  SpO2: 100% 100%    Last Pain:  Vitals:   06/16/17 1254  TempSrc: Oral  PainSc: 0-No pain                 [name]

## 2017-06-16 NOTE — H&P (Signed)
Mackenzie [name] is an 67 y.o. female.   Chief Complaint: Esophageal stricture, dysphagia HPI: 67 year old female with long history of upper esophageal stricture requiring serial dilation.  Her last dilation occurred in May.  She has had return of symptoms in the past few months and presents for repeat dilation.  Past Medical History:  Diagnosis Date  . Chronic systolic CHF (congestive heart failure) ([place])    a. 2D ECHO on 02/10/15 w/ severe LV dysfunction with EF 25-30%. Mild MR and mild LAE. negative nuclear stress test, felt to be due to tachycardia   . Dyspnea    with exertion  . Dysrhythmia   . Esophageal stricture    a. requiring dilations. crushes pills to take PO  . Full dentures   . GERD (gastroesophageal reflux disease)   . Osteoporosis    sees Dr. Carrolyn Meiers   . PAF (paroxysmal atrial fibrillation) ([place])    a. s/p succesfful DCCV on 02/13/15  . Pneumonia    (08/15/16)- many years ago  . Sebaceous cyst    on back of neck, has seen Dr. Ronnald Collum    Past Surgical History:  Procedure Laterality Date  . BREAST SURGERY Bilateral    Breast Implants  . CARDIOVERSION N/A 02/13/2015   Procedure: CARDIOVERSION;  Surgeon: Pixie Casino, MD;  Location: [place];  Service: Cardiovascular;  Laterality: N/A;  . COLONOSCOPY  09/24/2002  . ESOPHAGEAL DILATION N/A 08/19/2016   Procedure: ESOPHAGEAL DILATION;  Surgeon: Melida Quitter, MD;  Location: [place];  Service: ENT;  Laterality: N/A;  esophagoscopy with balloon dilation  . ESOPHAGOGASTRODUODENOSCOPY  06/01/2003   12/13  . ESOPHAGOGASTRODUODENOSCOPY (EGD) WITH ESOPHAGEAL DILATION    . RIGID ESOPHAGOSCOPY N/A 01/18/2013   Procedure: RIGID ESOPHAGOSCOPY WITH ESPHAGEAL Balloon DILATION ;  Surgeon: Melida Quitter, MD;  Location: [place];  Service: ENT;  Laterality: N/A;  . TEE WITHOUT CARDIOVERSION N/A 02/13/2015   Procedure: TRANSESOPHAGEAL ECHOCARDIOGRAM (TEE);  Surgeon: Pixie Casino, MD;  [facility];   Service: Cardiovascular;  Laterality: N/A;    Family History  Problem Relation Age of Onset  . Heart attack Mother 52       died from massive heart attack  . Aneurysm Father        cerebral  . Hypertension Sister   . Diabetes type II Sister   . Heart attack Brother   . Hypertension Sister   . Heart attack Brother   . Stroke Neg Hx    Social History:  reports that  has never smoked. she has never used smokeless tobacco. She reports that she does not drink alcohol or use drugs.  Allergies:  Allergies  Allergen Reactions  . Ramipril Cough    Medications Prior to Admission  Medication Sig Dispense Refill  . amiodarone (PACERONE) 200 MG tablet TAKE 1 TABLET BY MOUTH EVERY DAY 30 tablet 6  . lansoprazole (PREVACID) 30 MG capsule TAKE 1 CAPSULE (30 MG TOTAL) BY MOUTH DAILY. 90 capsule 3  . losartan (COZAAR) 100 MG tablet Take 1 tablet (100 mg total) daily by mouth. 30 tablet 6  . metoprolol tartrate (LOPRESSOR) 25 MG tablet TAKE 1/2 TABLET TWICE DAILY 30 tablet 10  . XARELTO 20 MG TABS tablet TAKE 1 TABLET (20 MG TOTAL) BY MOUTH DAILY WITH SUPPER. 90 tablet 0  . temazepam (RESTORIL) 30 MG capsule TAKE ONE CAPSULE BY MOUTH AT BEDTIME AS NEEDED FOR SLEEP (Patient taking differently: Take 15mg s by mouth at bedtime as  needed for sleep) 30 capsule 5    No results found for this or any previous visit (from the past 48 hour(s)). No results found.  Review of Systems  All other systems reviewed and are negative.   Blood pressure (!) 187/81, pulse (!) 59, temperature 98.2 F (36.8 C), temperature source Oral, resp. rate 18, height 5\' 1"  (1.549 m), weight 99 lb 6.4 oz (45.1 kg), SpO2 100 %. Physical Exam  Constitutional: She is oriented to person, place, and time. She appears well-developed and well-nourished. No distress.  HENT:  Head: Normocephalic and atraumatic.  Right Ear: External ear normal.  Left Ear: External ear normal.  Nose: Nose normal.  Mouth/Throat: Oropharynx is clear  and moist.  Eyes: Conjunctivae and EOM are normal. Pupils are equal, round, and reactive to light.  Neck: Normal range of motion. Neck supple.  Cardiovascular: Normal rate.  Respiratory: Effort normal.  Neurological: She is alert and oriented to person, place, and time. No cranial nerve deficit.  Skin: Skin is warm and dry.  Psychiatric: She has a normal mood and affect. Her behavior is normal. Judgment and thought content normal.     Assessment/Plan Esophageal stricture, dysphagia To OR for esophagoscopy with balloon dilation.  Melida Quitter, MD 06/16/2017, 11:12 AM

## 2017-06-16 NOTE — Discharge Instructions (Signed)

## 2017-06-16 NOTE — Anesthesia Preprocedure Evaluation (Addendum)
Anesthesia Evaluation  Patient identified by MRN, date of birth, ID band Patient awake    Reviewed: Allergy & Precautions, NPO status , Patient's Chart, lab work & pertinent test results, reviewed documented beta blocker date and time   Airway Mallampati: I  TM Distance: >3 FB Neck ROM: Full    Dental  (+) Dental Advisory Given, Upper Dentures, Lower Dentures   Pulmonary neg pulmonary ROS,    breath sounds clear to auscultation       Cardiovascular +CHF  + dysrhythmias Atrial Fibrillation  Rhythm:Regular Rate:Normal     Neuro/Psych negative neurological ROS     GI/Hepatic Neg liver ROS, GERD  Medicated,  Endo/Other  negative endocrine ROS  Renal/GU negative Renal ROS     Musculoskeletal negative musculoskeletal ROS (+)   Abdominal Normal abdominal exam  (+)   Peds  Hematology negative hematology ROS (+)   Anesthesia Other Findings   Reproductive/Obstetrics                            Lab Results  Component Value Date   WBC 4.6 08/19/2016   HGB 12.4 08/19/2016   HCT 39.7 08/19/2016   MCV 89.6 08/19/2016   PLT 213 08/19/2016   EKG: sinus bradycardia.   Anesthesia Physical Anesthesia Plan  ASA: III  Anesthesia Plan: General   Post-op Pain Management:    Induction: Intravenous  PONV Risk Score and Plan: 3 and Ondansetron, Dexamethasone and Midazolam  Airway Management Planned: Oral ETT  Additional Equipment: None  Intra-op Plan:   Post-operative Plan: Extubation in OR  Informed Consent:   Plan Discussed with: CRNA  Anesthesia Plan Comments:         Anesthesia Quick Evaluation

## 2017-06-16 NOTE — Anesthesia Procedure Notes (Signed)
Procedure Name: Intubation Date/Time: 06/16/2017 11:41 AM Performed by: Genelle Bal, CRNA Pre-anesthesia Checklist: Patient identified, Emergency Drugs available, Suction available and Patient being monitored Patient Re-evaluated:Patient Re-evaluated prior to induction Oxygen Delivery Method: Circle system utilized Preoxygenation: Pre-oxygenation with 100% oxygen Induction Type: IV induction Ventilation: Mask ventilation without difficulty Laryngoscope Size: Miller and 2 Grade View: Grade I Tube type: Oral Tube size: 7.0 mm Number of attempts: 1 Airway Equipment and Method: Stylet and Oral airway Placement Confirmation: ETT inserted through vocal cords under direct vision,  positive ETCO2 and breath sounds checked- equal and bilateral Secured at: 19 cm Tube secured with: Tape Dental Injury: Teeth and Oropharynx as per pre-operative assessment

## 2017-06-16 NOTE — Transfer of Care (Signed)
Immediate Anesthesia Transfer of Care Note  Patient: Mackenzie [name]  Procedure(s) Performed: ESOPHAGOSCOPY WITH BALLOON DILITATION (N/A Throat)  Patient Location: PACU  Anesthesia Type:General  Level of Consciousness: awake, alert  and oriented  Airway & Oxygen Therapy: Patient Spontanous Breathing and Patient connected to face mask oxygen  Post-op Assessment: Report given to RN and Post -op Vital signs reviewed and stable  Post vital signs: Reviewed and stable  Last Vitals:  Vitals:   06/16/17 1109 06/16/17 1210  BP: (!) 187/81 (!) 173/78  Pulse: (!) 59 62  Resp: 18 (!) 7  Temp: 36.8 C   SpO2: 100% 100%    Last Pain:  Vitals:   06/16/17 1109  TempSrc: Oral         Complications: No apparent anesthesia complications

## 2017-06-17 ENCOUNTER — Encounter (HOSPITAL_BASED_OUTPATIENT_CLINIC_OR_DEPARTMENT_OTHER): Payer: Self-pay | Admitting: Otolaryngology

## 2017-06-17 NOTE — Op Note (Signed)
Mackenzie [name], Mackenzie [name] NO.:  [id number]  MEDICAL RECORD NO.:  [id number]  LOCATION:                                 FACILITY:  PHYSICIAN:  Onnie Graham, MD     DATE OF BIRTH:  1951-01-11  DATE OF PROCEDURE:  06/16/2017 DATE OF DISCHARGE:  06/16/2017                              OPERATIVE REPORT   PREOPERATIVE DIAGNOSES:  Esophageal stricture and esophageal dysphagia.  POSTOPERATIVE DIAGNOSES:  Esophageal stricture and esophageal dysphagia.  PROCEDURE:  Rigid esophagoscopy with balloon dilation of esophageal stricture.  SURGEON:  Onnie Graham, MD.  ANESTHESIA:  General endotracheal anesthesia.  COMPLICATIONS:  None.  INDICATION:  The patient is a 67 year old female with a long history of upper esophageal stricture and associated dysphagia, who has required serial dilations, her last being in May 2018.  She has had return of obstructive symptoms in the last few months and presents to the operating room for surgical management.  FINDINGS:  The stricture was easily identified 14 cm from the lip and was dilated to 18 mm.  DESCRIPTION OF PROCEDURE:  The patient was identified in the holding room and informed consent having been obtained including discussion of risks, benefits, and alternatives, the patient was brought to the operative suite and put on the operative table in the supine position. Anesthesia was induced.  The patient was intubated by the Anesthesia team without difficulty.  The eyes taped closed and bed was turned 90 degrees from anesthesia.  A damp gauze was placed over the upper gum and the cervical esophagus scope was passed into the esophagus keeping the lumen in view.  The stricture was identified and the esophagoscope could not pass through it.  The largest tracheal balloon was then inserted and inflated first to 13 mm for 60 seconds and then deflated.  The area was examined and a mucosal tear was seen to the left side, but no  further injury.  The balloon was reinserted and inflated to 18 mm for another 60 seconds and then removed.  The balloon leaked fluid during this dilation, but was able to be dilated up to 18 mm for portion of that time.  The area was then re-examined and the mucosal tear was still seen to the left side. The esophagoscope was easily passed through the area of stricture and the esophagus was then carefully examined on the way out while suctioning the fluid.  The esophagoscope was removed and the patient was then turned back to Anesthesia for wake-up and was extubated, moved to the recovery room in stable condition.     Onnie Graham, MD     DDB/MEDQ  D:  06/16/2017  T:  06/16/2017  Job:  [id number]  cc:   Onnie Graham, MD's office

## 2017-06-22 ENCOUNTER — Other Ambulatory Visit: Payer: Self-pay | Admitting: Family Medicine

## 2017-07-26 ENCOUNTER — Other Ambulatory Visit: Payer: Self-pay | Admitting: Cardiology

## 2017-07-29 NOTE — Telephone Encounter (Signed)
Dose change due to CrCl < 33ml/min

## 2017-08-12 DIAGNOSIS — H0100B Unspecified blepharitis left eye, upper and lower eyelids: Secondary | ICD-10-CM | POA: Diagnosis not present

## 2017-08-16 NOTE — Progress Notes (Signed)
Cardiology Office Note   Date:  08/19/2017   ID:  Payeton, Germani 1951/01/30, MRN [id number]  PCP:  Laurey Morale, MD  Cardiologist:  Dr. Martinique     History of Present Illness: GAVRIELLA [name] is a 67 y.o. female seen for follow up Afib. she has a history of HTN, GERD, and esophageal stricture with previous dilations. She has a history of Afib and is on chronic amiodarone.  She presented to [facility] on 02/09/15 with SOB and palpitations. She was found to have new onset atrial fibrillation w/ RVR w/ evidence of CHF and mildly elevated troponin.   A 2D ECHO was ordered which revealed severe LV dysfunction with EF 25-30%. Mild MR and mild LAE. She underwent a nuclear stress test on 02/11/15 which was negative for ischemia. EF 21%. Her severe LV dysfunction was felt to be due to tachycardia and restoration of NSR important. She underwent successful TEE/DCCV on 02/13/15.  On 02/22/15 she was noted to be in recurrent atrial fibrillation w/ CVR with minimal sx aside from fatigue. She was started on amiodarone 200mg  BID and scheduled for follow up with  with plans for DCCV if she remained in afib. On subsequent follow up she had converted to NSR. Echo in Jan. 2017 showed normal LV function with EF 55-60%.   She did  undergo esophageal dilitation for stricture on 08/19/16 and again in March 2019.   On follow up today she is feeling  well. She brings a diary of BP readings which have been quite labile. Some days readings are good but other days systolic BP is in the 381-017 range.  She denies any palpitations, chest pain, SOB. Does some yard work Photographer medication well.  Past Medical History:  Diagnosis Date  . Chronic systolic CHF (congestive heart failure) ([place])    a. 2D ECHO on 02/10/15 w/ severe LV dysfunction with EF 25-30%. Mild MR and mild LAE. negative nuclear stress test, felt to be due to tachycardia   . Dyspnea    with exertion  . Dysrhythmia   . Esophageal stricture    a. requiring  dilations. crushes pills to take PO  . Full dentures   . GERD (gastroesophageal reflux disease)   . Osteoporosis    sees Dr. Carrolyn Meiers   . PAF (paroxysmal atrial fibrillation) ([place])    a. s/p succesfful DCCV on 02/13/15  . Pneumonia    (08/15/16)- many years ago  . Sebaceous cyst    on back of neck, has seen Dr. Ronnald Collum    Past Surgical History:  Procedure Laterality Date  . BREAST SURGERY Bilateral    Breast Implants  . CARDIOVERSION N/A 02/13/2015   Procedure: CARDIOVERSION;  Surgeon: Pixie Casino, MD;  Location: [place];  Service: Cardiovascular;  Laterality: N/A;  . COLONOSCOPY  09/24/2002  . ESOPHAGEAL DILATION N/A 08/19/2016   Procedure: ESOPHAGEAL DILATION;  Surgeon: Melida Quitter, MD;  Location: [place];  Service: ENT;  Laterality: N/A;  esophagoscopy with balloon dilation  . ESOPHAGOGASTRODUODENOSCOPY  06/01/2003   12/13  . ESOPHAGOGASTRODUODENOSCOPY (EGD) WITH ESOPHAGEAL DILATION    . ESOPHAGOSCOPY WITH DILITATION N/A 06/16/2017   Procedure: ESOPHAGOSCOPY WITH BALLOON DILITATION;  Surgeon: Melida Quitter, MD;  Location: [place];  Service: ENT;  Laterality: N/A;  . RIGID ESOPHAGOSCOPY N/A 01/18/2013   Procedure: RIGID ESOPHAGOSCOPY WITH ESPHAGEAL Balloon DILATION ;  Surgeon: Melida Quitter, MD;  Location: [place];  Service: ENT;  Laterality: N/A;  .  TEE WITHOUT CARDIOVERSION N/A 02/13/2015   Procedure: TRANSESOPHAGEAL ECHOCARDIOGRAM (TEE);  Surgeon: Pixie Casino, MD;  Location: [facility] ENDOSCOPY;  Service: Cardiovascular;  Laterality: N/A;     Current Outpatient Medications  Medication Sig Dispense Refill  . amiodarone (PACERONE) 200 MG tablet TAKE 1 TABLET BY MOUTH EVERY DAY 30 tablet 6  . lansoprazole (PREVACID) 30 MG capsule TAKE 1 CAPSULE (30 MG TOTAL) BY MOUTH DAILY. 90 capsule 0  . metoprolol tartrate (LOPRESSOR) 25 MG tablet TAKE 1/2 TABLET TWICE DAILY 30 tablet 10  . Rivaroxaban (XARELTO) 15 MG TABS tablet Take 1 tablet (15 mg  total) by mouth daily with supper. NOTE DOSE CHANGE 90 tablet 0  . temazepam (RESTORIL) 30 MG capsule TAKE ONE CAPSULE BY MOUTH AT BEDTIME AS NEEDED FOR SLEEP (Patient taking differently: Take 15mg s by mouth at bedtime as needed for sleep) 30 capsule 5  . chlorthalidone (HYGROTON) 25 MG tablet Take 0.5 tablets (12.5 mg total) by mouth daily. 15 tablet 11  . losartan (COZAAR) 100 MG tablet Take 1 tablet (100 mg total) by mouth daily. 30 tablet 11   No current facility-administered medications for this visit.     Allergies:   Ramipril    Social History:  The patient  reports that she has never smoked. She has never used smokeless tobacco. She reports that she does not drink alcohol or use drugs.   Family History:  The patient's family history includes Aneurysm in her father; Diabetes type II in her sister; Heart attack in her brother and brother; Heart attack (age of onset: 62) in her mother; Hypertension in her sister and sister.    ROS:  Please see the history of present illness.   Otherwise, review of systems are positive for NONE.   All other systems are reviewed and negative.    PHYSICAL EXAM: VS:  BP (!) 178/92 (BP Location: Left Arm, Patient Position: Sitting, Cuff Size: Normal)   Pulse (!) 54   Ht 5\' 1"  (1.549 m)   Wt 100 lb (45.4 kg)   BMI 18.89 kg/m  , BMI Body mass index is 18.89 kg/m. GENERAL:  Well appearing, thin WF in NAD HEENT:  PERRL, EOMI, sclera are clear. Oropharynx is clear. NECK:  No jugular venous distention, carotid upstroke brisk and symmetric, no bruits, no thyromegaly or adenopathy LUNGS:  Clear to auscultation bilaterally CHEST:  Unremarkable HEART:  RRR,  PMI not displaced or sustained,S1 and S2 within normal limits, no S3, no S4: no clicks, no rubs, no murmurs ABD:  Soft, nontender. BS +, no masses or bruits. No hepatomegaly, no splenomegaly EXT:  2 + pulses throughout, no edema, no cyanosis no clubbing SKIN:  Warm and dry.  No rashes NEURO:  Alert and  oriented x 3. Cranial nerves II through XII intact. PSYCH:  Cognitively intact  EKG:  EKG is not  ordered today.   Recent Labs: 02/18/2017: ALT 9; BUN 16; Creatinine, Ser 0.89; Potassium 4.2; Sodium 139; TSH 2.490    Lipid Panel    Component Value Date/Time   CHOL 207 (H) 02/18/2017 0959   TRIG 105 02/18/2017 0959   HDL 72 02/18/2017 0959   CHOLHDL 2.9 02/18/2017 0959   CHOLHDL 3.0 02/10/2015 0126   VLDL 12 02/10/2015 0126   LDLCALC 114 (H) 02/18/2017 0959   LDLDIRECT 121.0 03/14/2010 0931      Wt Readings from Last 3 Encounters:  08/19/17 100 lb (45.4 kg)  06/16/17 99 lb 6.4 oz (45.1 kg)  02/18/17 101  lb (45.8 kg)       Other studies Reviewed: Additional studies/ records that were reviewed today include: Echo Review of the above records demonstrates:  Echo 05/10/15: Study Conclusions  - Left ventricle: The cavity size was normal. Wall thickness was normal. Systolic function was normal. The estimated ejection fraction was in the range of 55% to 60%. Wall motion was normal; there were no regional wall motion abnormalities. Doppler parameters are consistent with psuedonormal left ventricular relaxation (grade 2 diastolic dysfunction). The E/A ratio is >1.5. The E/e&' ratio is >20, suggesting markedly elevated LV filling pressure. - Mitral valve: Mildly thickened leaflets . - Left atrium: The atrium was normal in size. - Right ventricle: The cavity size was normal. Wall thickness was normal. Systolic function is reduced. Lateral annulus peak S velocity: 8.79 cm/s. - Atrial septum: No defect or patent foramen ovale was identified. - Tricuspid valve: There was mild regurgitation. - Inferior vena cava: The vessel was normal in size. The respirophasic diameter changes were in the normal range (= 50%), consistent with normal central venous pressure.  Impressions:  - Compared to a prior study in 01/2015, the EF has normalized. There is Grade 2  diastolic dysfunction with elevated LV filling pressure.    ASSESSMENT AND PLAN: 1. Atrial fibrillation-paroxysmal.  s/p TEE/DCCV on 02/13/15, but noted to be back in afib in office on 02/22/15. Subsequently converted to NSR on amiodarone. Has maintained NSR for > 2 years now.  Continue  amiodarone to 100 mg. She is tolerating well. Will check chemistries and TSH  -- CHAD-vasc score of 3. Continue  Xarelto 20mg  for thromboembolic prophylaxis.  2. History of systolic CHF- EF 34-91%, with diffuse HK- felt to be tachycardia mediated.  EF returned to normal with management of her arrhythmia.  -- She has no further s/s CHF.   3.HTN- BP is labile. BP is still high on higher dose of losartan.  I don't think she will tolerate increase in beta blocker due to bradycardia. Will add chlorthalidone 12.5 mg daily. Check chemistries in 2 weeks.     4.GERD- continue PPI  5. Esophageal stricture s/p repeated dilation   Current medicines are reviewed at length with the patient today.  The patient does not have concerns regarding medicines.  The following changes have been made:add chlorthalidone 12.5 mg daily.   Labs/ tests ordered today include:   No orders of the defined types were placed in this encounter. will check CMET, lipids, TSH   Disposition:   FU with Dr. Martinique in 6 months   Signed, Peter Martinique, MD  08/19/2017 10:26 AM    [address], [place], [place]  [address] Phone: [phone]; Fax: [phone]

## 2017-08-19 ENCOUNTER — Ambulatory Visit: Payer: BLUE CROSS/BLUE SHIELD | Admitting: Cardiology

## 2017-08-19 ENCOUNTER — Encounter: Payer: Self-pay | Admitting: Cardiology

## 2017-08-19 VITALS — BP 178/92 | HR 54 | Ht 61.0 in | Wt 100.0 lb

## 2017-08-19 DIAGNOSIS — I4891 Unspecified atrial fibrillation: Secondary | ICD-10-CM | POA: Diagnosis not present

## 2017-08-19 DIAGNOSIS — I5022 Chronic systolic (congestive) heart failure: Secondary | ICD-10-CM

## 2017-08-19 DIAGNOSIS — I10 Essential (primary) hypertension: Secondary | ICD-10-CM | POA: Diagnosis not present

## 2017-08-19 DIAGNOSIS — I480 Paroxysmal atrial fibrillation: Secondary | ICD-10-CM | POA: Diagnosis not present

## 2017-08-19 MED ORDER — LOSARTAN POTASSIUM 100 MG PO TABS: 100.0000 mg | ORAL_TABLET | Freq: Every day | ORAL | 11 refills | Status: DC

## 2017-08-19 MED ORDER — CHLORTHALIDONE 25 MG PO TABS: 12.5000 mg | ORAL_TABLET | Freq: Every day | ORAL | 11 refills | Status: DC

## 2017-08-19 NOTE — Patient Instructions (Addendum)
Continue your current therapy  We will add chlorthalidone 12.5 mg daily for BP control.   We will check blood work in a couple of weeks  I will see you in 6 months.

## 2017-09-02 DIAGNOSIS — I4891 Unspecified atrial fibrillation: Secondary | ICD-10-CM | POA: Diagnosis not present

## 2017-09-02 DIAGNOSIS — I5022 Chronic systolic (congestive) heart failure: Secondary | ICD-10-CM | POA: Diagnosis not present

## 2017-09-02 LAB — BASIC METABOLIC PANEL
BUN/Creatinine Ratio: 20 (ref 12–28)
BUN: 20 mg/dL (ref 8–27)
CALCIUM: 10.2 mg/dL (ref 8.7–10.3)
CHLORIDE: 101 mmol/L (ref 96–106)
CO2: 24 mmol/L (ref 20–29)
Creatinine, Ser: 1 mg/dL (ref 0.57–1.00)
GFR, EST AFRICAN AMERICAN: 67 mL/min/{1.73_m2} (ref >59)
GFR, EST NON AFRICAN AMERICAN: 58 mL/min/{1.73_m2} — AB (ref >59)
Glucose: 88 mg/dL (ref 65–99)
Potassium: 4.9 mmol/L (ref 3.5–5.2)
Sodium: 140 mmol/L (ref 134–144)

## 2017-09-02 LAB — HEPATIC FUNCTION PANEL
ALBUMIN: 4.3 g/dL (ref 3.6–4.8)
ALT: 10 IU/L (ref 0–32)
AST: 24 IU/L (ref 0–40)
Alkaline Phosphatase: 40 IU/L (ref 39–117)
Bilirubin Total: 0.2 mg/dL (ref 0.0–1.2)
Bilirubin, Direct: 0.08 mg/dL (ref 0.00–0.40)
TOTAL PROTEIN: 7.6 g/dL (ref 6.0–8.5)

## 2017-09-02 LAB — LIPID PANEL W/O CHOL/HDL RATIO
Cholesterol, Total: 218 mg/dL — ABNORMAL HIGH (ref 100–199)
HDL: 84 mg/dL (ref >39)
LDL Calculated: 118 mg/dL — ABNORMAL HIGH (ref 0–99)
TRIGLYCERIDES: 80 mg/dL (ref 0–149)
VLDL CHOLESTEROL CAL: 16 mg/dL (ref 5–40)

## 2017-09-02 LAB — TSH: TSH: 4.62 u[IU]/mL — AB (ref 0.450–4.500)

## 2017-09-03 ENCOUNTER — Other Ambulatory Visit: Payer: Self-pay

## 2017-09-03 ENCOUNTER — Telehealth: Payer: Self-pay | Admitting: Cardiology

## 2017-09-03 DIAGNOSIS — R7989 Other specified abnormal findings of blood chemistry: Secondary | ICD-10-CM

## 2017-09-03 NOTE — Telephone Encounter (Signed)
Pt calling:   Returning call from [place] concerning her results

## 2017-09-03 NOTE — Telephone Encounter (Signed)
Left message to call back  

## 2017-09-03 NOTE — Telephone Encounter (Signed)
Spoke to patient lab results given.Advised to have repeat tsh and free t4 in 6 weeks.Lab order mailed.

## 2017-09-22 ENCOUNTER — Other Ambulatory Visit: Payer: Self-pay | Admitting: Family Medicine

## 2017-09-22 NOTE — Telephone Encounter (Signed)
Last refill. Pt will need an OV for more refills. Thanks

## 2017-10-15 DIAGNOSIS — Z79899 Other long term (current) drug therapy: Secondary | ICD-10-CM | POA: Diagnosis not present

## 2017-10-15 DIAGNOSIS — R7989 Other specified abnormal findings of blood chemistry: Secondary | ICD-10-CM | POA: Diagnosis not present

## 2017-10-16 LAB — TSH+FREE T4
FREE T4: 1.49 ng/dL (ref 0.82–1.77)
TSH: 2.62 u[IU]/mL (ref 0.450–4.500)

## 2017-10-21 ENCOUNTER — Other Ambulatory Visit: Payer: Self-pay | Admitting: Cardiology

## 2017-10-21 ENCOUNTER — Other Ambulatory Visit: Payer: Self-pay | Admitting: Family Medicine

## 2017-10-26 ENCOUNTER — Other Ambulatory Visit: Payer: Self-pay | Admitting: Family Medicine

## 2017-10-29 ENCOUNTER — Other Ambulatory Visit: Payer: Self-pay | Admitting: Family Medicine

## 2017-11-03 ENCOUNTER — Other Ambulatory Visit: Payer: Self-pay | Admitting: Family Medicine

## 2017-11-03 NOTE — Telephone Encounter (Signed)
30 day supply with no refills pt will need an OV for more refills

## 2017-11-29 ENCOUNTER — Other Ambulatory Visit: Payer: Self-pay | Admitting: Family Medicine

## 2017-12-08 ENCOUNTER — Encounter: Payer: Self-pay | Admitting: Family Medicine

## 2017-12-08 ENCOUNTER — Ambulatory Visit: Payer: BLUE CROSS/BLUE SHIELD | Admitting: Family Medicine

## 2017-12-08 VITALS — BP 144/80 | HR 59 | Temp 98.4°F | Ht 61.0 in | Wt 102.4 lb

## 2017-12-08 DIAGNOSIS — K219 Gastro-esophageal reflux disease without esophagitis: Secondary | ICD-10-CM

## 2017-12-08 DIAGNOSIS — Z23 Encounter for immunization: Secondary | ICD-10-CM

## 2017-12-08 MED ORDER — LANSOPRAZOLE 30 MG PO CPDR: DELAYED_RELEASE_CAPSULE | ORAL | 11 refills | Status: DC

## 2017-12-08 NOTE — Addendum Note (Signed)
Addended by: Myriam Forehand on: 12/08/2017 10:26 AM   Modules accepted: Orders

## 2017-12-08 NOTE — Progress Notes (Signed)
   Subjective:    Patient ID: Mackenzie [name], female    DOB: 25-Feb-1951, 67 y.o.   MRN: [id number]  HPI Here to follow up on GERD. She is doing well and has no complaints. She takes Prevacid daily. She had upper endoscopy with a dilation in March, and this was very successful.    Review of Systems  Constitutional: Negative.   Respiratory: Negative.   Cardiovascular: Negative.   Gastrointestinal: Negative.        Objective:   Physical Exam  Constitutional: She appears well-developed and well-nourished.  Cardiovascular: Normal rate, regular rhythm, normal heart sounds and intact distal pulses.  Pulmonary/Chest: Effort normal and breath sounds normal.  Abdominal: Soft. Bowel sounds are normal. She exhibits no distension and no mass. There is no tenderness. There is no rebound and no guarding.          Assessment & Plan:  Her GERD is well controlled. Given a Prevnar vaccine. As usual she declines a flu shot.  [name], MD

## 2017-12-20 ENCOUNTER — Other Ambulatory Visit: Payer: Self-pay | Admitting: Cardiology

## 2017-12-20 DIAGNOSIS — I5021 Acute systolic (congestive) heart failure: Secondary | ICD-10-CM

## 2017-12-20 DIAGNOSIS — I4891 Unspecified atrial fibrillation: Secondary | ICD-10-CM

## 2018-01-24 ENCOUNTER — Other Ambulatory Visit: Payer: Self-pay | Admitting: Cardiology

## 2018-02-06 NOTE — Progress Notes (Signed)
Cardiology Office Note   Date:  02/16/2018   ID:  Mackenzie [name], [name] Dec 17, 1950, MRN [id number]  PCP:  Laurey Morale, MD  Cardiologist:  Dr. Martinique     History of Present Illness: Mackenzie [name] is a 67 y.o. female seen for follow up Afib. she has a history of HTN, GERD, and esophageal stricture with previous dilations. She has a history of Afib and is on chronic amiodarone.  She presented to [facility] on 02/09/15 with SOB and palpitations. She was found to have new onset atrial fibrillation w/ RVR w/ evidence of CHF and mildly elevated troponin.   A 2D ECHO was ordered which revealed severe LV dysfunction with EF 25-30%. Mild MR and mild LAE. She underwent a nuclear stress test on 02/11/15 which was negative for ischemia. EF 21%. Her severe LV dysfunction was felt to be due to tachycardia and restoration of NSR important. She underwent successful TEE/DCCV on 02/13/15.  On 02/22/15 she was noted to be in recurrent atrial fibrillation w/ CVR with minimal sx aside from fatigue. She was started on amiodarone 200mg  BID and scheduled for follow up with  with plans for DCCV if she remained in afib. On subsequent follow up she had converted to NSR. Echo in Jan. 2017 showed normal LV function with EF 55-60%.   She did  undergo esophageal dilitation for stricture on 08/19/16 and again in March 2019.   On follow up today she is is not doing well. She reports increased dyspnea on exertion. She notes feeling very dizzy and has some neck discomfort. Some days she feels really bad. Her BP monitor shows an irregular pulse at times and HR will be up to 90-110. BP is labile with occ. BP up to 010-932 systolic but at other times it will be down to 88 systolic. Most of the time her BP is normal.  She states her recent symptoms are similar to when she had Afib.   Past Medical History:  Diagnosis Date  . Chronic systolic CHF (congestive heart failure) ([place])    a. 2D ECHO on 02/10/15 w/ severe LV dysfunction with EF  25-30%. Mild MR and mild LAE. negative nuclear stress test, felt to be due to tachycardia   . Dyspnea    with exertion  . Dysrhythmia   . Esophageal stricture    a. requiring dilations. crushes pills to take PO  . Full dentures   . GERD (gastroesophageal reflux disease)   . Osteoporosis    sees Dr. Carrolyn Meiers   . PAF (paroxysmal atrial fibrillation) ([place])    a. s/p succesfful DCCV on 02/13/15  . Pneumonia    (08/15/16)- many years ago  . Sebaceous cyst    on back of neck, has seen Dr. Ronnald Collum    Past Surgical History:  Procedure Laterality Date  . BREAST SURGERY Bilateral    Breast Implants  . CARDIOVERSION N/A 02/13/2015   Procedure: CARDIOVERSION;  Surgeon: Pixie Casino, MD;  Location: [place];  Service: Cardiovascular;  Laterality: N/A;  . COLONOSCOPY  09/24/2002  . ESOPHAGEAL DILATION N/A 08/19/2016   Procedure: ESOPHAGEAL DILATION;  Surgeon: Melida Quitter, MD;  Location: [place];  Service: ENT;  Laterality: N/A;  esophagoscopy with balloon dilation  . ESOPHAGOGASTRODUODENOSCOPY  06/01/2003   12/13  . ESOPHAGOGASTRODUODENOSCOPY (EGD) WITH ESOPHAGEAL DILATION    . ESOPHAGOSCOPY WITH DILITATION N/A 06/16/2017   Procedure: ESOPHAGOSCOPY WITH BALLOON DILITATION;  Surgeon: Melida Quitter, MD;  Location: [facility];  Service: ENT;  Laterality: N/A;  . RIGID ESOPHAGOSCOPY N/A 01/18/2013   Procedure: RIGID ESOPHAGOSCOPY WITH ESPHAGEAL Balloon DILATION ;  Surgeon: Melida Quitter, MD;  Location: [place];  Service: ENT;  Laterality: N/A;  . TEE WITHOUT CARDIOVERSION N/A 02/13/2015   Procedure: TRANSESOPHAGEAL ECHOCARDIOGRAM (TEE);  Surgeon: Pixie Casino, MD;  Location: [facility] ENDOSCOPY;  Service: Cardiovascular;  Laterality: N/A;     Current Outpatient Medications  Medication Sig Dispense Refill  . amiodarone (PACERONE) 200 MG tablet TAKE 1 TABLET BY MOUTH EVERY DAY 30 tablet 6  . chlorthalidone (HYGROTON) 25 MG tablet Take 0.5 tablets (12.5 mg total)  by mouth daily. 15 tablet 11  . lansoprazole (PREVACID) 30 MG capsule TAKE 1 CAPSULE BY MOUTH EVERY DAY 30 capsule 11  . losartan (COZAAR) 100 MG tablet Take 1 tablet (100 mg total) by mouth daily. 30 tablet 11  . metoprolol tartrate (LOPRESSOR) 25 MG tablet TAKE 1/2 TABLET TWICE DAILY 30 tablet 10  . temazepam (RESTORIL) 30 MG capsule TAKE ONE CAPSULE BY MOUTH AT BEDTIME AS NEEDED FOR SLEEP (Patient taking differently: Take 15mg s by mouth at bedtime as needed for sleep) 30 capsule 5  . XARELTO 15 MG TABS tablet TAKE 1 TABLET (15 MG TOTAL) BY MOUTH DAILY WITH SUPPER. NOTE DOSE CHANGE 90 tablet 1   No current facility-administered medications for this visit.     Allergies:   Ramipril    Social History:  The patient  reports that she has never smoked. She has never used smokeless tobacco. She reports that she does not drink alcohol or use drugs.   Family History:  The patient's family history includes Aneurysm in her father; Diabetes type II in her sister; Heart attack in her brother and brother; Heart attack (age of onset: 54) in her mother; Hypertension in her sister and sister.    ROS:  Please see the history of present illness.   Otherwise, review of systems are positive for NONE.   All other systems are reviewed and negative.    PHYSICAL EXAM: VS:  BP (!) 155/85   Pulse 61   Ht 5\' 7"  (1.702 m)   Wt 99 lb 12.8 oz (45.3 kg)   BMI 15.63 kg/m  , BMI Body mass index is 15.63 kg/m. GENERAL:  Well appearing thin WF in NAD HEENT:  PERRL, EOMI, sclera are clear. Oropharynx is clear. NECK:  No jugular venous distention, carotid upstroke brisk and symmetric, no bruits, no thyromegaly or adenopathy LUNGS:  Clear to auscultation bilaterally CHEST:  Unremarkable HEART:  RRR,  PMI not displaced or sustained,S1 and S2 within normal limits, no S3, no S4: no clicks, no rubs, no murmurs ABD:  Soft, nontender. BS +, no masses or bruits. No hepatomegaly, no splenomegaly EXT:  2 + pulses throughout,  no edema, no cyanosis no clubbing SKIN:  Warm and dry.  No rashes NEURO:  Alert and oriented x 3. Cranial nerves II through XII intact. PSYCH:  Cognitively intact    EKG:  EKG is  ordered today. NSR with rate 61. Nonspecific ST T wave abnormality. I have personally reviewed and interpreted this study.   Recent Labs: 09/02/2017: ALT 10; BUN 20; Creatinine, Ser 1.00; Potassium 4.9; Sodium 140 10/15/2017: TSH 2.620    Lipid Panel    Component Value Date/Time   CHOL 218 (H) 09/02/2017 0854   TRIG 80 09/02/2017 0854   HDL 84 09/02/2017 0854   CHOLHDL 2.9 02/18/2017 0959   CHOLHDL 3.0  02/10/2015 0126   VLDL 12 02/10/2015 0126   LDLCALC 118 (H) 09/02/2017 0854   LDLDIRECT 121.0 03/14/2010 0931      Wt Readings from Last 3 Encounters:  02/16/18 99 lb 12.8 oz (45.3 kg)  12/08/17 102 lb 6.4 oz (46.4 kg)  08/19/17 100 lb (45.4 kg)       Other studies Reviewed: Additional studies/ records that were reviewed today include: Echo Review of the above records demonstrates:  Echo 05/10/15: Study Conclusions  - Left ventricle: The cavity size was normal. Wall thickness was normal. Systolic function was normal. The estimated ejection fraction was in the range of 55% to 60%. Wall motion was normal; there were no regional wall motion abnormalities. Doppler parameters are consistent with psuedonormal left ventricular relaxation (grade 2 diastolic dysfunction). The E/A ratio is >1.5. The E/e&' ratio is >20, suggesting markedly elevated LV filling pressure. - Mitral valve: Mildly thickened leaflets . - Left atrium: The atrium was normal in size. - Right ventricle: The cavity size was normal. Wall thickness was normal. Systolic function is reduced. Lateral annulus peak S velocity: 8.79 cm/s. - Atrial septum: No defect or patent foramen ovale was identified. - Tricuspid valve: There was mild regurgitation. - Inferior vena cava: The vessel was normal in size.  The respirophasic diameter changes were in the normal range (= 50%), consistent with normal central venous pressure.  Impressions:  - Compared to a prior study in 01/2015, the EF has normalized. There is Grade 2 diastolic dysfunction with elevated LV filling pressure.    ASSESSMENT AND PLAN: 1. Atrial fibrillation-paroxysmal.  s/p TEE/DCCV on 02/13/15, but noted to be back in afib in office on 02/22/15. Subsequently converted to NSR on amiodarone. Has maintained NSR for > 3 years now but I am concerned she may be having breakthrough based on recent symptoms and BP/pulse recording.   -- we will check CMET, CBC, and TSH today. -- we will have her wear an event monitor to see if she has breakthrough AFib.  -- CHAD-vasc score of 3. Continue  Xarelto   2. Dyspnea on exertion. Multiple potential etiologies including anemia, recurrent Afib, CHF, or amiodarone toxicity. Check labs and event monitor  as noted. Will arrange for CXR, PFTs, and Echocardiogram.   3.HTN- BP is labile but typically under good control. Continue current antihypertensive therapy.     4.GERD- continue PPI  5. Esophageal stricture s/p repeated dilation   Current medicines are reviewed at length with the patient today.  The patient does not have concerns regarding medicines.  The following changes have been made:add chlorthalidone 12.5 mg daily.   Labs/ tests ordered today include:   Orders Placed This Encounter  Procedures  . DG Chest 2 View  . CBC w/Diff/Platelet  . Comprehensive Metabolic Panel (CMET)  . TSH  . EKG 12-Lead  Echo Event monitor PFTs  Disposition:   FU with Dr. Martinique in 4-6 weeks.   Signed, Mackenzie Cairns Martinique, MD  02/16/2018 10:00 AM    [address], [place], [place]  [address] Phone: [phone]; Fax: [phone]

## 2018-02-16 ENCOUNTER — Ambulatory Visit: Payer: BLUE CROSS/BLUE SHIELD | Admitting: Cardiology

## 2018-02-16 ENCOUNTER — Encounter: Payer: Self-pay | Admitting: Cardiology

## 2018-02-16 ENCOUNTER — Ambulatory Visit
Admission: RE | Admit: 2018-02-16 | Discharge: 2018-02-16 | Disposition: A | Discharge status: Home / Self Care | Payer: BLUE CROSS/BLUE SHIELD | Source: Ambulatory Visit | Attending: Cardiology | Admitting: Cardiology

## 2018-02-16 VITALS — BP 155/85 | HR 61 | Ht 67.0 in | Wt 99.8 lb

## 2018-02-16 DIAGNOSIS — I480 Paroxysmal atrial fibrillation: Secondary | ICD-10-CM | POA: Diagnosis not present

## 2018-02-16 DIAGNOSIS — R0602 Shortness of breath: Secondary | ICD-10-CM | POA: Diagnosis not present

## 2018-02-16 DIAGNOSIS — Z7901 Long term (current) use of anticoagulants: Secondary | ICD-10-CM

## 2018-02-16 DIAGNOSIS — R0609 Other forms of dyspnea: Secondary | ICD-10-CM

## 2018-02-16 DIAGNOSIS — I10 Essential (primary) hypertension: Secondary | ICD-10-CM | POA: Diagnosis not present

## 2018-02-16 LAB — COMPREHENSIVE METABOLIC PANEL
A/G RATIO: 1.4 (ref 1.2–2.2)
ALBUMIN: 4.2 g/dL (ref 3.6–4.8)
ALK PHOS: 35 IU/L — AB (ref 39–117)
ALT: 11 IU/L (ref 0–32)
AST: 20 IU/L (ref 0–40)
BILIRUBIN TOTAL: 0.5 mg/dL (ref 0.0–1.2)
BUN / CREAT RATIO: 10 — AB (ref 12–28)
BUN: 12 mg/dL (ref 8–27)
CHLORIDE: 100 mmol/L (ref 96–106)
CO2: 23 mmol/L (ref 20–29)
Calcium: 9.8 mg/dL (ref 8.7–10.3)
Creatinine, Ser: 1.22 mg/dL — ABNORMAL HIGH (ref 0.57–1.00)
GFR calc non Af Amer: 46 mL/min/{1.73_m2} — ABNORMAL LOW (ref >59)
GFR, EST AFRICAN AMERICAN: 53 mL/min/{1.73_m2} — AB (ref >59)
GLOBULIN, TOTAL: 3.1 g/dL (ref 1.5–4.5)
GLUCOSE: 91 mg/dL (ref 65–99)
POTASSIUM: 4.6 mmol/L (ref 3.5–5.2)
SODIUM: 139 mmol/L (ref 134–144)
Total Protein: 7.3 g/dL (ref 6.0–8.5)

## 2018-02-16 LAB — CBC WITH DIFFERENTIAL/PLATELET
BASOS ABS: 0.1 10*3/uL (ref 0.0–0.2)
BASOS: 1 %
EOS (ABSOLUTE): 0.1 10*3/uL (ref 0.0–0.4)
Eos: 1 %
HEMOGLOBIN: 12.9 g/dL (ref 11.1–15.9)
Hematocrit: 38.6 % (ref 34.0–46.6)
Immature Grans (Abs): 0 10*3/uL (ref 0.0–0.1)
Immature Granulocytes: 0 %
LYMPHS ABS: 1.4 10*3/uL (ref 0.7–3.1)
Lymphs: 22 %
MCH: 30.1 pg (ref 26.6–33.0)
MCHC: 33.4 g/dL (ref 31.5–35.7)
MCV: 90 fL (ref 79–97)
MONOCYTES: 9 %
Monocytes Absolute: 0.5 10*3/uL (ref 0.1–0.9)
NEUTROS ABS: 4.1 10*3/uL (ref 1.4–7.0)
Neutrophils: 67 %
Platelets: 277 10*3/uL (ref 150–450)
RBC: 4.28 x10E6/uL (ref 3.77–5.28)
RDW: 12 % — ABNORMAL LOW (ref 12.3–15.4)
WBC: 6.2 10*3/uL (ref 3.4–10.8)

## 2018-02-16 LAB — TSH: TSH: 2.22 u[IU]/mL (ref 0.450–4.500)

## 2018-02-16 NOTE — Addendum Note (Signed)
Addended by: Kathyrn Lass on: 02/16/2018 10:12 AM   Modules accepted: Orders

## 2018-02-16 NOTE — Patient Instructions (Signed)
We will check blood work today  We will schedule you for a chest Xray, Echocardiogram, event monitor and pulmonary function tests.   Continue the same medication for now.   Follow up in 4-6 weeks.

## 2018-02-26 ENCOUNTER — Ambulatory Visit (INDEPENDENT_AMBULATORY_CARE_PROVIDER_SITE_OTHER): Payer: BLUE CROSS/BLUE SHIELD

## 2018-02-26 ENCOUNTER — Ambulatory Visit (HOSPITAL_COMMUNITY)
Admission: RE | Admit: 2018-02-26 | Discharge: 2018-02-26 | Disposition: A | Discharge status: Home / Self Care | Payer: BLUE CROSS/BLUE SHIELD | Source: Ambulatory Visit | Attending: Cardiology | Admitting: Cardiology

## 2018-02-26 ENCOUNTER — Other Ambulatory Visit: Payer: Self-pay

## 2018-02-26 ENCOUNTER — Ambulatory Visit (HOSPITAL_BASED_OUTPATIENT_CLINIC_OR_DEPARTMENT_OTHER): Payer: BLUE CROSS/BLUE SHIELD

## 2018-02-26 DIAGNOSIS — Z7901 Long term (current) use of anticoagulants: Secondary | ICD-10-CM

## 2018-02-26 DIAGNOSIS — I10 Essential (primary) hypertension: Secondary | ICD-10-CM | POA: Diagnosis not present

## 2018-02-26 DIAGNOSIS — I480 Paroxysmal atrial fibrillation: Secondary | ICD-10-CM | POA: Insufficient documentation

## 2018-02-26 DIAGNOSIS — R0609 Other forms of dyspnea: Secondary | ICD-10-CM | POA: Diagnosis not present

## 2018-02-26 LAB — PULMONARY FUNCTION TEST
DL/VA % pred: 89 %
DL/VA: 3.94 ml/min/mmHg/L
DLCO UNC % PRED: 63 %
DLCO unc: 12.78 ml/min/mmHg
FEF 25-75 PRE: 0.84 L/s
FEF 25-75 Post: 0.99 L/sec
FEF2575-%CHANGE-POST: 18 %
FEF2575-%PRED-POST: 54 %
FEF2575-%PRED-PRE: 45 %
FEV1-%Change-Post: 4 %
FEV1-%PRED-POST: 67 %
FEV1-%Pred-Pre: 64 %
FEV1-Post: 1.38 L
FEV1-Pre: 1.32 L
FEV1FVC-%CHANGE-POST: 11 %
FEV1FVC-%Pred-Pre: 91 %
FEV6-%CHANGE-POST: -4 %
FEV6-%Pred-Post: 68 %
FEV6-%Pred-Pre: 71 %
FEV6-Post: 1.78 L
FEV6-Pre: 1.86 L
FEV6FVC-%Change-Post: 1 %
FEV6FVC-%Pred-Post: 104 %
FEV6FVC-%Pred-Pre: 102 %
FVC-%CHANGE-POST: -5 %
FVC-%PRED-POST: 65 %
FVC-%Pred-Pre: 69 %
FVC-Post: 1.78 L
FVC-Pre: 1.89 L
POST FEV1/FVC RATIO: 77 %
PRE FEV1/FVC RATIO: 70 %
Post FEV6/FVC ratio: 100 %
Pre FEV6/FVC Ratio: 98 %
RV % pred: 112 %
RV: 2.25 L
TLC % pred: 89 %
TLC: 4.14 L

## 2018-02-26 MED ORDER — ALBUTEROL SULFATE (2.5 MG/3ML) 0.083% IN NEBU
2.5000 mg | INHALATION_SOLUTION | Freq: Once | RESPIRATORY_TRACT | Status: AC
  Administered 2018-02-26: 2.5 mg via RESPIRATORY_TRACT

## 2018-03-08 ENCOUNTER — Telehealth: Payer: Self-pay | Admitting: Physician Assistant

## 2018-03-08 NOTE — Telephone Encounter (Signed)
Paged by answering service, per device company patient had a brief episode of atrial fibrillation for 75 seconds at rate of 80s this morning.  Patient reverted back to sinus rhythm.  I have called the patient but she did not verified her date of birth and does not wanted to speak further.  She will call us tomorrow when office opens.

## 2018-03-09 ENCOUNTER — Telehealth: Payer: Self-pay | Admitting: Cardiology

## 2018-03-09 NOTE — Telephone Encounter (Signed)
Spoke with pt and stated was probably cooking yesterday when episode occurred appears this has been only episode since starting to  wearing monitor Per pt b/p monitor stated had a heart rate in the 100's Will forward to Dr Martinique for review Per pt has to wear monitor for a couple more weeks .Adonis Housekeeper

## 2018-03-09 NOTE — Telephone Encounter (Signed)
She did have an episode of afib lasting 75 seconds. Rate was OK   Continue to wear monitor.  Angila Wombles Martinique MD, [facility]

## 2018-03-09 NOTE — Telephone Encounter (Signed)
Spoke to patient Dr.Jordan's recommendation given. 

## 2018-03-09 NOTE — Telephone Encounter (Signed)
New Message   Patient is calling because she received call on yesterday about her monitor. She was uneasy about providing information so she opted to call this morning. It does show in the notes where Vin Bhagat did reach out to her. Please call to disucss.

## 2018-03-18 ENCOUNTER — Ambulatory Visit: Payer: BLUE CROSS/BLUE SHIELD | Admitting: Cardiology

## 2018-03-27 NOTE — Progress Notes (Addendum)
Cardiology Office Note   Date:  03/31/2018   ID:  Mackenzie [name], Mackenzie [name] [birth year], MRN [id number]  PCP:  Laurey Morale, MD  Cardiologist:  Dr. Martinique     History of Present Illness: Mackenzie [name] is a 67 y.o. female seen for follow up Afib. she has a history of HTN, GERD, and esophageal stricture with previous dilations. She has a history of Afib and is on chronic amiodarone.  She presented to [facility] on 02/09/15 with SOB and palpitations. She was found to have new onset atrial fibrillation w/ RVR w/ evidence of CHF and mildly elevated troponin.   A 2D ECHO was ordered which revealed severe LV dysfunction with EF 25-30%. Mild MR and mild LAE. She underwent a nuclear stress test on 02/11/15 which was negative for ischemia. EF 21%. Her severe LV dysfunction was felt to be due to tachycardia and restoration of NSR important. She underwent successful TEE/DCCV on 02/13/15.  On 02/22/15 she was noted to be in recurrent atrial fibrillation w/ CVR with minimal sx aside from fatigue. She was started on amiodarone 200mg  BID and scheduled for follow up with  with plans for DCCV if she remained in afib. On subsequent follow up she had converted to NSR. Echo in Jan. 2017 showed normal LV function with EF 55-60%.   She did undergo esophageal dilitation for stricture on 08/19/16 and again in March 2019.   She was seen in July with multiple complaints.  She reported increased dyspnea on exertion. Had dizziness and has some neck discomfort. Her BP monitor showed an irregular pulse at times and HR will be up to 90-110. BP is labile with occ. BP up to 093-818 systolic but at other times it will be down to 88 systolic. Most of the time her BP is normal.  These symptoms were similar to when she had Afib. Labs were OK. Echo was unchanged. PFTs showed moderately reduce diffusion capacity and moderate obstructive airway disease. Event monitor was placed. Results are pending. She did have an episode of AFib in November  lasting 75 seconds but I don't know burden yet.   She did note while wearing the monitor that she had one day where she had some skipping all day. BP is still somewhat labile but improved on chlorthalidone. Breathing is still not great but a little better than before. Notes her husband does smoke.   Past Medical History:  Diagnosis Date  . Chronic systolic CHF (congestive heart failure) ([place])    a. 2D ECHO on 02/10/15 w/ severe LV dysfunction with EF 25-30%. Mild MR and mild LAE. negative nuclear stress test, felt to be due to tachycardia   . Dyspnea    with exertion  . Dysrhythmia   . Esophageal stricture    a. requiring dilations. crushes pills to take PO  . Full dentures   . GERD (gastroesophageal reflux disease)   . Osteoporosis    sees Dr. Carrolyn Meiers   . PAF (paroxysmal atrial fibrillation) ([place])    a. s/p succesfful DCCV on 02/13/15  . Pneumonia    (08/15/16)- many years ago  . Sebaceous cyst    on back of neck, has seen Dr. Ronnald Collum    Past Surgical History:  Procedure Laterality Date  . BREAST SURGERY Bilateral    Breast Implants  . CARDIOVERSION N/A 02/13/2015   Procedure: CARDIOVERSION;  Surgeon: Pixie Casino, MD;  Location: [facility] ENDOSCOPY;  Service: Cardiovascular;  Laterality: N/A;  . COLONOSCOPY  09/24/2002  . ESOPHAGEAL DILATION N/A 08/19/2016   Procedure: ESOPHAGEAL DILATION;  Surgeon: Melida Quitter, MD;  Location: [place];  Service: ENT;  Laterality: N/A;  esophagoscopy with balloon dilation  . ESOPHAGOGASTRODUODENOSCOPY  06/01/2003   12/13  . ESOPHAGOGASTRODUODENOSCOPY (EGD) WITH ESOPHAGEAL DILATION    . ESOPHAGOSCOPY WITH DILITATION N/A 06/16/2017   Procedure: ESOPHAGOSCOPY WITH BALLOON DILITATION;  Surgeon: Melida Quitter, MD;  Location: [place];  Service: ENT;  Laterality: N/A;  . RIGID ESOPHAGOSCOPY N/A 01/18/2013   Procedure: RIGID ESOPHAGOSCOPY WITH ESPHAGEAL Balloon DILATION ;  Surgeon: Melida Quitter, MD;  Location: [place];   Service: ENT;  Laterality: N/A;  . TEE WITHOUT CARDIOVERSION N/A 02/13/2015   Procedure: TRANSESOPHAGEAL ECHOCARDIOGRAM (TEE);  Surgeon: Pixie Casino, MD;  Location: [facility] ENDOSCOPY;  Service: Cardiovascular;  Laterality: N/A;     Current Outpatient Medications  Medication Sig Dispense Refill  . amiodarone (PACERONE) 200 MG tablet Take 0.5 tablets (100 mg total) by mouth daily. 30 tablet 6  . chlorthalidone (HYGROTON) 25 MG tablet Take 0.5 tablets (12.5 mg total) by mouth daily. 15 tablet 11  . lansoprazole (PREVACID) 30 MG capsule TAKE 1 CAPSULE BY MOUTH EVERY DAY 30 capsule 11  . losartan (COZAAR) 100 MG tablet Take 1 tablet (100 mg total) by mouth daily. 30 tablet 11  . metoprolol tartrate (LOPRESSOR) 25 MG tablet TAKE 1/2 TABLET TWICE DAILY 30 tablet 10  . temazepam (RESTORIL) 30 MG capsule TAKE ONE CAPSULE BY MOUTH AT BEDTIME AS NEEDED FOR SLEEP (Patient taking differently: Take 15mg s by mouth at bedtime as needed for sleep) 30 capsule 5  . XARELTO 15 MG TABS tablet TAKE 1 TABLET (15 MG TOTAL) BY MOUTH DAILY WITH SUPPER. NOTE DOSE CHANGE 90 tablet 1   No current facility-administered medications for this visit.     Allergies:   Ramipril    Social History:  The patient  reports that she has never smoked. She has never used smokeless tobacco. She reports that she does not drink alcohol or use drugs.   Family History:  The patient's family history includes Aneurysm in her father; Diabetes type II in her sister; Heart attack in her brother and brother; Heart attack (age of onset: 77) in her mother; Hypertension in her sister and sister.    ROS:  Please see the history of present illness.   Otherwise, review of systems are positive for NONE.   All other systems are reviewed and negative.    PHYSICAL EXAM: VS:  BP 132/82   Pulse (!) 56   Ht 5\' 1"  (1.549 m)   Wt 100 lb 3.2 oz (45.5 kg)   BMI 18.93 kg/m  , BMI Body mass index is 18.93 kg/m. GENERAL:  Well appearing thin WF in  NAD HEENT:  PERRL, EOMI, sclera are clear. Oropharynx is clear. NECK:  No jugular venous distention, carotid upstroke brisk and symmetric, no bruits, no thyromegaly or adenopathy LUNGS:  Clear to auscultation bilaterally CHEST:  Unremarkable HEART:  RRR,  PMI not displaced or sustained,S1 and S2 within normal limits, no S3, no S4: no clicks, no rubs, no murmurs ABD:  Soft, nontender. BS +, no masses or bruits. No hepatomegaly, no splenomegaly EXT:  2 + pulses throughout, no edema, no cyanosis no clubbing SKIN:  Warm and dry.  No rashes NEURO:  Alert and oriented x 3. Cranial nerves II through XII intact. PSYCH:  Cognitively intact    EKG:  EKG is not ordered today.  Recent Labs: 02/16/2018: ALT 11; BUN 12; Creatinine, Ser 1.22; Hemoglobin 12.9; Platelets 277; Potassium 4.6; Sodium 139; TSH 2.220    Lipid Panel    Component Value Date/Time   CHOL 218 (H) 09/02/2017 0854   TRIG 80 09/02/2017 0854   HDL 84 09/02/2017 0854   CHOLHDL 2.9 02/18/2017 0959   CHOLHDL 3.0 02/10/2015 0126   VLDL 12 02/10/2015 0126   LDLCALC 118 (H) 09/02/2017 0854   LDLDIRECT 121.0 03/14/2010 0931      Wt Readings from Last 3 Encounters:  03/31/18 100 lb 3.2 oz (45.5 kg)  02/16/18 99 lb 12.8 oz (45.3 kg)  12/08/17 102 lb 6.4 oz (46.4 kg)       Other studies Reviewed: Additional studies/ records that were reviewed today include: Echo Review of the above records demonstrates:  Echo 05/10/15: Study Conclusions  - Left ventricle: The cavity size was normal. Wall thickness was normal. Systolic function was normal. The estimated ejection fraction was in the range of 55% to 60%. Wall motion was normal; there were no regional wall motion abnormalities. Doppler parameters are consistent with psuedonormal left ventricular relaxation (grade 2 diastolic dysfunction). The E/A ratio is >1.5. The E/e&' ratio is >20, suggesting markedly elevated LV filling pressure. - Mitral valve: Mildly  thickened leaflets . - Left atrium: The atrium was normal in size. - Right ventricle: The cavity size was normal. Wall thickness was normal. Systolic function is reduced. Lateral annulus peak S velocity: 8.79 cm/s. - Atrial septum: No defect or patent foramen ovale was identified. - Tricuspid valve: There was mild regurgitation. - Inferior vena cava: The vessel was normal in size. The respirophasic diameter changes were in the normal range (= 50%), consistent with normal central venous pressure.  Impressions:  - Compared to a prior study in 01/2015, the EF has normalized. There is Grade 2 diastolic dysfunction with elevated LV filling pressure.  Echo 02/26/18: Study Conclusions  - Left ventricle: The cavity size was normal. Wall thickness was   normal. Systolic function was normal. The estimated ejection   fraction was in the range of 55% to 60%. Wall motion was normal;   there were no regional wall motion abnormalities. Doppler   parameters are consistent with abnormal left ventricular   relaxation (grade 2 diastolic dysfunction). The E/e&' ratio is   >15, suggesting elevated LV filling pressure. - Mitral valve: Mildly thickened leaflets . There was trivial   regurgitation. - Left atrium: The atrium was normal in size. - Tricuspid valve: There was mild regurgitation. - Pulmonary arteries: PA peak pressure: 34 mm Hg (S). - Inferior vena cava: The vessel was normal in size. The   respirophasic diameter changes were in the normal range (>= 50%),   consistent with normal central venous pressure.  Impressions:  - Compared to a prior study in 2017, there are no significant   changes.  PFTs: Post-Test Comments: Good patient effort. The results of this test meet ATS standards for acceptability and repeatability. HHN given with 2.5mg  of Albuterol Hgb default value of 13.4 used. The FVC, FEV1, FEV1/FVC ratio and FEF25-75% are reduced indicating airway obstruction. The  airway resistance is normal. The SVC is reduced, but the TLC is within normal limits. Following administration of bronchodilators, there is no significant response. The reduced diffusing capacity indicates a moderate loss of functional alveolar capillary surface. However, the diffusing capacity was not corrected for the patient's hemoglobin. Conclusions: Although there is airway obstruction and a diffusion defect suggesting emphysema, the absence  of overinflation is inconsistent with that diagnosis. Pulmonary Function Diagnosis: Moderate Obstructive Airways Disease Insignificant response to bronchodilator Moderate Diffusion Defect  ASSESSMENT AND PLAN: 1. Atrial fibrillation-paroxysmal.  s/p TEE/DCCV on 02/13/15, but noted to be back in afib in office on 02/22/15. Subsequently converted to NSR on amiodarone. Has maintained NSR for > 3 years on low dose amiodarone 100 mg daily. She is having some breakthrough but need to await results of event monitor. If infrequent Afib I would favor continuing Amiodarone for now. If Afib burden is higher I would like EP to see for consideration of ablation.  -- labs were OK -- CHAD-vasc score of 3. Continue  Xarelto   2. Dyspnea on exertion. Multiple potential etiologies including  recurrent Afib, amiodarone toxicity, or COPD. PFTs suggest obstructive airway disease. CXR shows some hyperinflation. Moderately reduced diffusion capacity could be related to COPD or amiodarone toxicity. I have no baseline for comparison. If event monitor shows infrequent Afib we will refer to pulmonary first.   3. HTN- BP is under good control. Continue current antihypertensive therapy.     4. GERD- continue PPI  5. Esophageal stricture s/p repeated dilation   Current medicines are reviewed at length with the patient today.  The patient does not have concerns regarding medicines.  The following changes have been made:add chlorthalidone 12.5 mg daily.   Labs/ tests ordered  today include:   No orders of the defined types were placed in this encounter.   Disposition:   FU with Dr. Martinique in 3 months  Signed, Peter Martinique, MD  03/31/2018 9:06 AM    [address], [place], [place]  [address] Phone: [phone]; Fax: [phone]   Addendum: Event monitor reviewed. It appears that she is having significant break through Afib on multiple days. Some days lasting all day. Recommend referral to EP to consider AFib ablation.  Peter Martinique MD, [facility]

## 2018-03-31 ENCOUNTER — Ambulatory Visit: Payer: BLUE CROSS/BLUE SHIELD | Admitting: Cardiology

## 2018-03-31 ENCOUNTER — Encounter: Payer: Self-pay | Admitting: Cardiology

## 2018-03-31 VITALS — BP 132/82 | HR 56 | Ht 61.0 in | Wt 100.2 lb

## 2018-03-31 DIAGNOSIS — I480 Paroxysmal atrial fibrillation: Secondary | ICD-10-CM | POA: Diagnosis not present

## 2018-03-31 DIAGNOSIS — I5021 Acute systolic (congestive) heart failure: Secondary | ICD-10-CM

## 2018-03-31 DIAGNOSIS — Z7901 Long term (current) use of anticoagulants: Secondary | ICD-10-CM

## 2018-03-31 DIAGNOSIS — I10 Essential (primary) hypertension: Secondary | ICD-10-CM

## 2018-03-31 DIAGNOSIS — R0609 Other forms of dyspnea: Secondary | ICD-10-CM

## 2018-03-31 MED ORDER — AMIODARONE HCL 200 MG PO TABS: 100.0000 mg | ORAL_TABLET | Freq: Every day | ORAL | 6 refills | Status: DC

## 2018-04-22 ENCOUNTER — Ambulatory Visit: Payer: BLUE CROSS/BLUE SHIELD | Admitting: Internal Medicine

## 2018-04-22 ENCOUNTER — Encounter: Payer: Self-pay | Admitting: Internal Medicine

## 2018-04-22 VITALS — BP 154/82 | HR 58 | Ht 61.0 in | Wt 98.8 lb

## 2018-04-22 DIAGNOSIS — I480 Paroxysmal atrial fibrillation: Secondary | ICD-10-CM

## 2018-04-22 DIAGNOSIS — Z01812 Encounter for preprocedural laboratory examination: Secondary | ICD-10-CM | POA: Diagnosis not present

## 2018-04-22 DIAGNOSIS — I10 Essential (primary) hypertension: Secondary | ICD-10-CM | POA: Diagnosis not present

## 2018-04-22 NOTE — Progress Notes (Signed)
Electrophysiology Office Note Date: 04/22/2018  ID:  Mackenzie [name], Mackenzie [name] 10/01/1950, MRN [id number]  PCP: Laurey Morale, MD Primary Cardiologist: Dr Martinique Electrophysiologist: Dr Rayann Heman  CC: Evaluation of paroxysmal atrial fibrillation  Mackenzie [name] is a 68 y.o. female seen today at the request of Dr Martinique for evaluation and management of atrial fibrillation.  She has a PNH significant for HTN, tachycardia mediated cardiomyopathy, and esophogeal strictures. She presented to [facility] on 02/09/15 with SOB and palpitations. She was found to have new onset atrial fibrillation w/ RVR w/ evidence of CHF and mildly elevated troponin.  A 2D ECHO was ordered which revealed severe LV dysfunction with EF 25-30%. Mild MR and mild LAE. She underwent a nuclear stress test on 02/11/15 which was negative for ischemia. EF 21%. Her severe LV dysfunction was felt to be due to tachycardia and restoration of NSR important. She underwent successful TEE/DCCV on 02/13/15. On 02/22/15 she was noted to be in recurrent atrial fibrillation w/ CVR with minimal sx aside from fatigue. She was started on amiodarone 200mg  BID and scheduled for follow up with  with plans for DCCV if she remained in afib. On subsequent follow up she had converted to NSR. Echo in Jan. 2017 showed normal LV function with EF 55-60%.   More recently, she has had more breakthrough episodes of atrial fibrilaltion which make her very tired and short of breath. She wore an event monitor 02/2018 which showed paroxysmal atrial fibrillation w/ controlled rates, afib burden 11%.  She denies chest pain, PND, orthopnea, nausea, vomiting, dizziness, syncope, edema, weight gain, or early satiety. +fatigue, palpitations, dyspnea  Past Medical History:  Diagnosis Date  . Chronic systolic CHF (congestive heart failure) ([place])    a. 2D ECHO on 02/10/15 w/ severe LV dysfunction with EF 25-30%. Mild MR and mild LAE. negative nuclear stress test, felt to be due to  tachycardia   . Dyspnea    with exertion  . Dysrhythmia   . Esophageal stricture    a. requiring dilations. crushes pills to take PO  . Full dentures   . GERD (gastroesophageal reflux disease)   . Osteoporosis    sees Dr. Carrolyn Meiers   . PAF (paroxysmal atrial fibrillation) ([place])    a. s/p succesfful DCCV on 02/13/15  . Pneumonia    (08/15/16)- many years ago  . Sebaceous cyst    on back of neck, has seen Dr. Ronnald Collum   Past Surgical History:  Procedure Laterality Date  . BREAST SURGERY Bilateral    Breast Implants  . CARDIOVERSION N/A 02/13/2015   Procedure: CARDIOVERSION;  Surgeon: Pixie Casino, MD;  Location: [place];  Service: Cardiovascular;  Laterality: N/A;  . COLONOSCOPY  09/24/2002  . ESOPHAGEAL DILATION N/A 08/19/2016   Procedure: ESOPHAGEAL DILATION;  Surgeon: Melida Quitter, MD;  Location: [place];  Service: ENT;  Laterality: N/A;  esophagoscopy with balloon dilation  . ESOPHAGOGASTRODUODENOSCOPY  06/01/2003   12/13  . ESOPHAGOGASTRODUODENOSCOPY (EGD) WITH ESOPHAGEAL DILATION    . ESOPHAGOSCOPY WITH DILITATION N/A 06/16/2017   Procedure: ESOPHAGOSCOPY WITH BALLOON DILITATION;  Surgeon: Melida Quitter, MD;  Location: [place];  Service: ENT;  Laterality: N/A;  . RIGID ESOPHAGOSCOPY N/A 01/18/2013   Procedure: RIGID ESOPHAGOSCOPY WITH ESPHAGEAL Balloon DILATION ;  Surgeon: Melida Quitter, MD;  Location: [place];  Service: ENT;  Laterality: N/A;  . TEE WITHOUT CARDIOVERSION N/A 02/13/2015   Procedure: TRANSESOPHAGEAL ECHOCARDIOGRAM (TEE);  Surgeon: Nadean Corwin  Hilty, MD;  Location: [place] ENDOSCOPY;  Service: Cardiovascular;  Laterality: N/A;    Current Outpatient Medications  Medication Sig Dispense Refill  . amiodarone (PACERONE) 200 MG tablet Take 0.5 tablets (100 mg total) by mouth daily. 30 tablet 6  . chlorthalidone (HYGROTON) 25 MG tablet Take 0.5 tablets (12.5 mg total) by mouth daily. 15 tablet 11  . lansoprazole (PREVACID) 30 MG  capsule TAKE 1 CAPSULE BY MOUTH EVERY DAY 30 capsule 11  . losartan (COZAAR) 100 MG tablet Take 1 tablet (100 mg total) by mouth daily. 30 tablet 11  . metoprolol tartrate (LOPRESSOR) 25 MG tablet TAKE 1/2 TABLET TWICE DAILY 30 tablet 10  . temazepam (RESTORIL) 30 MG capsule TAKE ONE CAPSULE BY MOUTH AT BEDTIME AS NEEDED FOR SLEEP (Patient taking differently: Take 15mg s by mouth at bedtime as needed for sleep) 30 capsule 5  . XARELTO 15 MG TABS tablet TAKE 1 TABLET (15 MG TOTAL) BY MOUTH DAILY WITH SUPPER. NOTE DOSE CHANGE 90 tablet 1   No current facility-administered medications for this visit.     Allergies:   Ramipril   Social History: Social History   Socioeconomic History  . Marital status: Married    Spouse name: Not on file  . Number of children: Not on file  . Years of education: Not on file  . Highest education level: Not on file  Occupational History  . Not on file  Social Needs  . Financial resource strain: Not on file  . Food insecurity:    Worry: Not on file    Inability: Not on file  . Transportation needs:    Medical: Not on file    Non-medical: Not on file  Tobacco Use  . Smoking status: Never Smoker  . Smokeless tobacco: Never Used  Substance and Sexual Activity  . Alcohol use: No    Alcohol/week: 0.0 standard drinks  . Drug use: No  . Sexual activity: Not on file  Lifestyle  . Physical activity:    Days per week: Not on file    Minutes per session: Not on file  . Stress: Not on file  Relationships  . Social connections:    Talks on phone: Not on file    Gets together: Not on file    Attends religious service: Not on file    Active member of club or organization: Not on file    Attends meetings of clubs or organizations: Not on file    Relationship status: Not on file  . Intimate partner violence:    Fear of current or ex partner: Not on file    Emotionally abused: Not on file    Physically abused: Not on file    Forced sexual activity: Not on  file  Other Topics Concern  . Not on file  Social History Narrative  . Not on file    Family History: Family History  Problem Relation Age of Onset  . Heart attack Mother 22       died from massive heart attack  . Aneurysm Father        cerebral  . Hypertension Sister   . Diabetes type II Sister   . Heart attack Brother   . Hypertension Sister   . Heart attack Brother   . Stroke Neg Hx     Review of Systems: All other systems reviewed and are otherwise negative except as noted above.   Physical Exam: VS:  BP (!) 154/82   Pulse (!) 58  Ht 5\' 1"  (1.549 m)   Wt 98 lb 12.8 oz (44.8 kg)   SpO2 99%   BMI 18.67 kg/m  , BMI Body mass index is 18.67 kg/m. Wt Readings from Last 3 Encounters:  04/22/18 98 lb 12.8 oz (44.8 kg)  03/31/18 100 lb 3.2 oz (45.5 kg)  02/16/18 99 lb 12.8 oz (45.3 kg)    GEN- The patient is well appearing, alert and oriented x 3 today.   HEENT: normocephalic, atraumatic; sclera clear, conjunctiva pink; hearing intact; oropharynx clear; neck supple, no JVP Lymph- no cervical lymphadenopathy Lungs- Clear to ausculation bilaterally, normal work of breathing.  No wheezes, rales, rhonchi Heart- Regular rate and rhythm, no murmurs, rubs or gallops, PMI not laterally displaced GI- soft, non-tender, non-distended, bowel sounds present, no hepatosplenomegaly Extremities- no clubbing, cyanosis, or edema; DP/PT/radial pulses 2+ bilaterally MS- no significant deformity or atrophy Skin- warm and dry, no rash or lesion  Psych- euthymic mood, full affect Neuro- strength and sensation are intact   EKG:  EKG is ordered today. The ekg ordered today shows sinus bradycardia HR 58, NST, PR 196, QRS 78, QTc 453  Recent Labs: 02/16/2018: ALT 11; BUN 12; Creatinine, Ser 1.22; Hemoglobin 12.9; Platelets 277; Potassium 4.6; Sodium 139; TSH 2.220    Other studies Reviewed: Additional studies/ records that were reviewed today include: Notes in Epic, cardiac monitoring,  Echo Review of the above records today demonstrates:   Normal sinus rhythm  Nonsustained ventricular tachycardia  Paroxysmal Afib with generally controlled rate. some elevate rate. burden 11% Echo 02/26/18 Left ventricle: The cavity size was normal. Wall thickness was   normal. Systolic function was normal. The estimated ejection   fraction was in the range of 55% to 60%. Wall motion was normal;   there were no regional wall motion abnormalities. Doppler   parameters are consistent with abnormal left ventricular   relaxation (grade 2 diastolic dysfunction). The E/e&' ratio is   >15, suggesting elevated LV filling pressure. - Mitral valve: Mildly thickened leaflets . There was trivial   regurgitation. - Left atrium: The atrium was normal in size. - Tricuspid valve: There was mild regurgitation. - Pulmonary arteries: PA peak pressure: 34 mm Hg (S). - Inferior vena cava: The vessel was normal in size. The   respirophasic diameter changes were in the normal range (>= 50%),   consistent with normal central venous pressure.  Assessment and Plan:  1. Paroxysmal atrial fibrillation Currently on amiodarone and Xarelto but having more breakthrough episodes of afib. CHADS2VASC score of 3 Therapeutic strategies for afib including medicine and ablation were discussed in detail with the patient today. Risk, benefits, and alternatives to EP study and radiofrequency ablation for afib were also discussed in detail today. These risks include but are not limited to stroke, bleeding, vascular damage, tamponade, perforation, damage to the esophagus, lungs, and other structures, pulmonary vein stenosis, worsening renal function, and death. The patient understands these risk and wishes to proceed.  We will therefore proceed with catheter ablation at the next available time.  Continue amiodarone and metoprolol 12.5 mg BID for now  2. HTN BP mildly elevated today, possibly anxious about meeting [place]  today. Hold on making changes today. Recheck at next visit.   Current medicines are reviewed at length with the patient today.   The patient does not have concerns regarding her medicines.  The following changes were made today:  none    Signed, Thompson Grayer MD 04/22/2018 9:49 AM   [facility]  [address] [place] [address] [phone] (office) [phone] (fax)

## 2018-04-22 NOTE — Patient Instructions (Addendum)
Medication Instructions:  Your physician recommends that you continue on your current medications as directed. Please refer to the Current Medication list given to you today. If you need a refill on your cardiac medications before your next appointment, please call your pharmacy.   Labwork: Pre procedure labs today: BMP & CBC   Testing/Procedures: Your physician has requested that you have cardiac CT. Cardiac computed tomography (CT) is a painless test that uses an x-ray machine to take clear, detailed pictures of your heart. For further information please visit [email or web address]. Please follow instructions below under special instructions area. You will get a call from our office to schedule the date for this test.  Your physician has recommended that you have an ablation. Catheter ablation is a medical procedure used to treat some cardiac arrhythmias (irregular heartbeats). During catheter ablation, a long, thin, flexible tube is put into a blood vessel in your groin (upper thigh), or neck. This tube is called an ablation catheter. It is then guided to your heart through the blood vessel. Radio frequency waves destroy small areas of heart tissue where abnormal heartbeats may cause an arrhythmia to start. Please see the instructions below under special instructions area.   Follow-Up: You will follow up with Mackenzie Palau, NP with the Atrial fibrillation (Afib) clinic 4 weeks after your ablation on 05/19/18.  You will follow up with Dr. Rayann [name] 3 months after your procedure on 05/19/18.    SPECIAL INSTRUCTIONS    CARDIAC CT INSTRUCTIONS: Please arrive at the [facility] main entrance of [facility] at _________ AM (30-45 minutes prior to test start time)  [facility] [place], [place] [id number] [phone]  Proceed to the [facility] Radiology Department (First Floor).  Please follow these instructions carefully (unless otherwise directed):  On  the Night Before the Test: . Be sure to Drink plenty of water. . Do not consume any caffeinated/decaffeinated beverages or chocolate 12 hours prior to your test. . Do not take any antihistamines 12 hours prior to your test. . If you take Metformin do not take 24 hours prior to test.  On the Day of the Test: . Drink plenty of water. Do not drink any water within one hour of the test. . Do not eat any food 4 hours prior to the test. . You may take your regular medications prior to the test.  . Take metoprolol (Lopressor) two hours prior to test. . HOLD Furosemide/Hydrochlorothiazide morning of the test.       After the Test: . Drink plenty of water. . After receiving IV contrast, you may experience a mild flushed feeling. This is normal. . On occasion, you may experience a mild rash up to 24 hours after the test. This is not dangerous. If this occurs, you can take Benadryl 25 mg and increase your fluid intake. . If you experience trouble breathing, this can be serious. If it is severe call 911 IMMEDIATELY. If it is mild, please call our office. . If you take any of these medications: Glipizide/Metformin, Avandament, Glucavance, please do not take 48 hours after completing test.    ABLATION INSTRUCTIONS: Please arrive at the [facility] main entrance of [facility] hospital at:8:00 am on 05/19/2018. Do not eat or drink after midnight prior to procedure On the morning of your procedure do not take any medications. Plan for one night stay.  You will need someone to drive you home at discharge.

## 2018-04-23 LAB — BASIC METABOLIC PANEL
BUN/Creatinine Ratio: 20 (ref 12–28)
BUN: 23 mg/dL (ref 8–27)
CALCIUM: 10 mg/dL (ref 8.7–10.3)
CHLORIDE: 103 mmol/L (ref 96–106)
CO2: 22 mmol/L (ref 20–29)
Creatinine, Ser: 1.16 mg/dL — ABNORMAL HIGH (ref 0.57–1.00)
GFR, EST AFRICAN AMERICAN: 56 mL/min/{1.73_m2} — AB (ref >59)
GFR, EST NON AFRICAN AMERICAN: 48 mL/min/{1.73_m2} — AB (ref >59)
Glucose: 93 mg/dL (ref 65–99)
POTASSIUM: 4.5 mmol/L (ref 3.5–5.2)
SODIUM: 134 mmol/L (ref 134–144)

## 2018-04-23 LAB — CBC
HEMATOCRIT: 37.8 % (ref 34.0–46.6)
Hemoglobin: 12.4 g/dL (ref 11.1–15.9)
MCH: 29.2 pg (ref 26.6–33.0)
MCHC: 32.8 g/dL (ref 31.5–35.7)
MCV: 89 fL (ref 79–97)
PLATELETS: 272 10*3/uL (ref 150–450)
RBC: 4.24 x10E6/uL (ref 3.77–5.28)
RDW: 12.2 % (ref 11.7–15.4)
WBC: 6.9 10*3/uL (ref 3.4–10.8)

## 2018-05-06 ENCOUNTER — Telehealth: Payer: Self-pay | Admitting: Cardiology

## 2018-05-06 ENCOUNTER — Telehealth: Payer: Self-pay | Admitting: Internal Medicine

## 2018-05-06 NOTE — Telephone Encounter (Signed)
New message      Pt stated that she wants to cancel her sx on 2/4 with Dr. Rayann Heman  due to personal reasons. Pt stated that she doesn't want to reschedule at this time . Pt does have other questions. Please follow up

## 2018-05-06 NOTE — Telephone Encounter (Signed)
Returned call to Pt.  Pt would like to cancel afib ablation.  Asked what other issues Pt has.  She just wanted to make sure I would cancel all parts of her ablation.  Assured would cancel all parts of her work up.

## 2018-05-06 NOTE — Telephone Encounter (Signed)
PA for xarelto handled by MD RN's, will forward to [facility]

## 2018-05-06 NOTE — Telephone Encounter (Signed)
F/U Message         Patient is calling to check status on her medication that needs to be Authorized, she states she don't want to be out by the weekend. [facility] # is [phone] pls call and advise.

## 2018-05-06 NOTE — Telephone Encounter (Signed)
New message      Pt stated that she needs assistance forms for XARELTO 15 MG TABS tablet. She stated that [facility] stated that insurance wont pay. Pt will be out this weekend.

## 2018-05-07 ENCOUNTER — Telehealth: Payer: Self-pay

## 2018-05-07 MED ORDER — RIVAROXABAN 15 MG PO TABS: ORAL_TABLET | ORAL | 6 refills | Status: DC

## 2018-05-07 NOTE — Telephone Encounter (Signed)
Spoke to patient prior authorization for Xarelto approved.Refill sent to pharmacy.

## 2018-05-07 NOTE — Telephone Encounter (Signed)
See previous 05/07/18 telephone note.

## 2018-05-12 ENCOUNTER — Ambulatory Visit (HOSPITAL_COMMUNITY): Payer: BLUE CROSS/BLUE SHIELD

## 2018-05-19 ENCOUNTER — Encounter (HOSPITAL_COMMUNITY): Payer: Self-pay

## 2018-05-19 ENCOUNTER — Ambulatory Visit (HOSPITAL_COMMUNITY): Admit: 2018-05-19 | Payer: Medicare Other | Admitting: Internal Medicine

## 2018-05-19 SURGERY — ATRIAL FIBRILLATION ABLATION
Anesthesia: General

## 2018-05-27 ENCOUNTER — Telehealth: Payer: Self-pay | Admitting: Cardiology

## 2018-05-27 DIAGNOSIS — H52203 Unspecified astigmatism, bilateral: Secondary | ICD-10-CM | POA: Diagnosis not present

## 2018-05-27 DIAGNOSIS — Z961 Presence of intraocular lens: Secondary | ICD-10-CM | POA: Diagnosis not present

## 2018-05-27 DIAGNOSIS — H26493 Other secondary cataract, bilateral: Secondary | ICD-10-CM | POA: Diagnosis not present

## 2018-05-27 NOTE — Telephone Encounter (Signed)
Returned pt call. Pt sts that she was scheduled for an ablation with Dr.Allred earlier this month and decided to cancel the procedure. She has been doing well since she was last seen by Dr.Jordan in Dec 2019, she had one episode of "racing heart" that was short in duration. She is taking all of her medications as prescribed, including Xarelto, Metoprolol, and Amiodarone. She usually sees Dr.Jordan every 6 months. She is scheduled to see him in March which would be 3 months. She is assuming that the f/u was made sooner since she was schedule for the ablation. Now that the procedure is cancelled she would like to know if Dr.Jordan wants her to keep the appt or push it out to 6 mo. Adv pt that I will fwd the message to Dr. Martinique and his nurse Malachy Mood to f/u with her. Pt agreeable with plan verbalized understanding.

## 2018-05-27 NOTE — Telephone Encounter (Signed)
New message:    Patient states that doctor wanted to see early. Or should she keep the one she had sense she not having surgery

## 2018-05-27 NOTE — Telephone Encounter (Signed)
We can see back in 6 months. She can let us know if arrhythmia gets worse.   Peter Martinique MD, [facility]

## 2018-05-29 NOTE — Telephone Encounter (Signed)
Spoke to patient Dr.Jordan's advice given.Advised to call sooner if needed.

## 2018-06-09 DIAGNOSIS — R1310 Dysphagia, unspecified: Secondary | ICD-10-CM | POA: Diagnosis not present

## 2018-06-09 DIAGNOSIS — K222 Esophageal obstruction: Secondary | ICD-10-CM | POA: Diagnosis not present

## 2018-06-10 ENCOUNTER — Other Ambulatory Visit: Payer: Self-pay | Admitting: Otolaryngology

## 2018-06-15 ENCOUNTER — Other Ambulatory Visit: Payer: Self-pay

## 2018-06-15 ENCOUNTER — Encounter (HOSPITAL_BASED_OUTPATIENT_CLINIC_OR_DEPARTMENT_OTHER): Payer: Self-pay | Admitting: *Deleted

## 2018-06-15 ENCOUNTER — Ambulatory Visit (HOSPITAL_COMMUNITY): Payer: BLUE CROSS/BLUE SHIELD | Admitting: Nurse Practitioner

## 2018-06-15 ENCOUNTER — Telehealth: Payer: Self-pay | Admitting: Cardiology

## 2018-06-15 NOTE — Telephone Encounter (Signed)
OK to have procedure and hold Xarelto for 48 hours before.  Malachy Coleman Martinique MD, [facility]

## 2018-06-15 NOTE — Telephone Encounter (Signed)
Spoke to patient Dr.Jordan's recommendation given.Patient requested I call Tammy at surgical center [phone] and make her aware.

## 2018-06-15 NOTE — Telephone Encounter (Signed)
PATIENT IS CALLING , HAVE NOT RECEIVED PRE  OP  CLEARANCE.  PER PATIENT SHE IS HAVING PROCEDURE AT [facility] - BY DR BATES.   SCHEDULE FOR MARCH 9,2020 AWARE WILL DEFER TO DR Martinique

## 2018-06-15 NOTE — Telephone Encounter (Signed)
Patient is having an esophageal dilation done on Monday 3/9, she wants to know when she should or if she needs to stop taking her Rivaroxaban (XARELTO) 15 MG TABS tablet.

## 2018-06-15 NOTE — Progress Notes (Signed)
Patient is on Xarelto and has no orders to stop. Dan Maker at Dr Redmond Baseman office notified of need to have OK from cards Dr Martinique. Dr Redmond Baseman would like pt off x2d pt prior to surgery. Also pt states due to her esophageal stricture, she has difficulty swallowing pills and crushes them in pudding. Advised pt NOT to eat pudding and to crush them with sm amts of just water. States she will try. Will come in for BMP.

## 2018-06-16 NOTE — Telephone Encounter (Signed)
Left message on Tammy at Surgical center's personal voice Dr.Jordan advised ok for patient to hold Xarelto 48 hours before procedure.

## 2018-06-18 ENCOUNTER — Encounter (HOSPITAL_BASED_OUTPATIENT_CLINIC_OR_DEPARTMENT_OTHER)
Admission: RE | Admit: 2018-06-18 | Discharge: 2018-06-18 | Disposition: A | Discharge status: Home / Self Care | Payer: BLUE CROSS/BLUE SHIELD | Source: Ambulatory Visit | Attending: Otolaryngology | Admitting: Otolaryngology

## 2018-06-18 DIAGNOSIS — Z7901 Long term (current) use of anticoagulants: Secondary | ICD-10-CM | POA: Diagnosis not present

## 2018-06-18 DIAGNOSIS — K222 Esophageal obstruction: Secondary | ICD-10-CM | POA: Diagnosis not present

## 2018-06-18 DIAGNOSIS — Z888 Allergy status to other drugs, medicaments and biological substances status: Secondary | ICD-10-CM | POA: Diagnosis not present

## 2018-06-18 DIAGNOSIS — Z01812 Encounter for preprocedural laboratory examination: Secondary | ICD-10-CM | POA: Insufficient documentation

## 2018-06-18 DIAGNOSIS — Z79899 Other long term (current) drug therapy: Secondary | ICD-10-CM | POA: Diagnosis not present

## 2018-06-18 DIAGNOSIS — K219 Gastro-esophageal reflux disease without esophagitis: Secondary | ICD-10-CM | POA: Diagnosis not present

## 2018-06-18 DIAGNOSIS — I5022 Chronic systolic (congestive) heart failure: Secondary | ICD-10-CM | POA: Diagnosis not present

## 2018-06-18 DIAGNOSIS — I480 Paroxysmal atrial fibrillation: Secondary | ICD-10-CM | POA: Diagnosis not present

## 2018-06-18 DIAGNOSIS — I110 Hypertensive heart disease with heart failure: Secondary | ICD-10-CM | POA: Diagnosis not present

## 2018-06-18 LAB — BASIC METABOLIC PANEL
Anion gap: 8 (ref 5–15)
BUN: 19 mg/dL (ref 8–23)
CO2: 24 mmol/L (ref 22–32)
Calcium: 9.3 mg/dL (ref 8.9–10.3)
Chloride: 104 mmol/L (ref 98–111)
Creatinine, Ser: 1.21 mg/dL — ABNORMAL HIGH (ref 0.44–1.00)
GFR calc non Af Amer: 46 mL/min — ABNORMAL LOW (ref >60)
GFR, EST AFRICAN AMERICAN: 53 mL/min — AB (ref >60)
Glucose, Bld: 77 mg/dL (ref 70–99)
Potassium: 4.4 mmol/L (ref 3.5–5.1)
Sodium: 136 mmol/L (ref 135–145)

## 2018-06-22 ENCOUNTER — Ambulatory Visit (HOSPITAL_BASED_OUTPATIENT_CLINIC_OR_DEPARTMENT_OTHER)
Admission: RE | Admit: 2018-06-22 | Discharge: 2018-06-22 | Disposition: A | Discharge status: Home / Self Care | Payer: BLUE CROSS/BLUE SHIELD | Attending: Otolaryngology | Admitting: Otolaryngology

## 2018-06-22 ENCOUNTER — Ambulatory Visit (HOSPITAL_BASED_OUTPATIENT_CLINIC_OR_DEPARTMENT_OTHER): Payer: BLUE CROSS/BLUE SHIELD | Admitting: Certified Registered Nurse Anesthetist

## 2018-06-22 ENCOUNTER — Encounter (HOSPITAL_BASED_OUTPATIENT_CLINIC_OR_DEPARTMENT_OTHER): Payer: Self-pay | Admitting: *Deleted

## 2018-06-22 ENCOUNTER — Encounter (HOSPITAL_BASED_OUTPATIENT_CLINIC_OR_DEPARTMENT_OTHER)
Admission: RE | Disposition: A | Discharge status: Home / Self Care | Payer: Self-pay | Source: Home / Self Care | Attending: Otolaryngology

## 2018-06-22 ENCOUNTER — Other Ambulatory Visit: Payer: Self-pay

## 2018-06-22 DIAGNOSIS — Z888 Allergy status to other drugs, medicaments and biological substances status: Secondary | ICD-10-CM | POA: Diagnosis not present

## 2018-06-22 DIAGNOSIS — I110 Hypertensive heart disease with heart failure: Secondary | ICD-10-CM | POA: Diagnosis not present

## 2018-06-22 DIAGNOSIS — K219 Gastro-esophageal reflux disease without esophagitis: Secondary | ICD-10-CM | POA: Diagnosis not present

## 2018-06-22 DIAGNOSIS — I5023 Acute on chronic systolic (congestive) heart failure: Secondary | ICD-10-CM | POA: Diagnosis not present

## 2018-06-22 DIAGNOSIS — I5022 Chronic systolic (congestive) heart failure: Secondary | ICD-10-CM | POA: Diagnosis not present

## 2018-06-22 DIAGNOSIS — I480 Paroxysmal atrial fibrillation: Secondary | ICD-10-CM | POA: Insufficient documentation

## 2018-06-22 DIAGNOSIS — K222 Esophageal obstruction: Secondary | ICD-10-CM | POA: Diagnosis not present

## 2018-06-22 DIAGNOSIS — Z79899 Other long term (current) drug therapy: Secondary | ICD-10-CM | POA: Diagnosis not present

## 2018-06-22 DIAGNOSIS — R1310 Dysphagia, unspecified: Secondary | ICD-10-CM | POA: Diagnosis not present

## 2018-06-22 DIAGNOSIS — Z7901 Long term (current) use of anticoagulants: Secondary | ICD-10-CM | POA: Insufficient documentation

## 2018-06-22 HISTORY — PX: BALLOON DILATION: SHX5330

## 2018-06-22 HISTORY — PX: RIGID ESOPHAGOSCOPY: SHX5226

## 2018-06-22 SURGERY — ESOPHAGOSCOPY, RIGID
Anesthesia: General | Site: Throat

## 2018-06-22 MED ORDER — PROPOFOL 10 MG/ML IV BOLUS
INTRAVENOUS | Status: DC | PRN
  Administered 2018-06-22: 20 mg via INTRAVENOUS
  Administered 2018-06-22: 140 mg via INTRAVENOUS

## 2018-06-22 MED ORDER — ONDANSETRON HCL 4 MG/2ML IJ SOLN
INTRAMUSCULAR | Status: DC | PRN
  Administered 2018-06-22: 4 mg via INTRAVENOUS

## 2018-06-22 MED ORDER — OXYCODONE HCL 5 MG/5ML PO SOLN: 5.0000 mg | Freq: Once | ORAL | Status: DC | PRN

## 2018-06-22 MED ORDER — MIDAZOLAM HCL 2 MG/2ML IJ SOLN
INTRAMUSCULAR | Status: DC | PRN
  Administered 2018-06-22: 1 mg via INTRAVENOUS

## 2018-06-22 MED ORDER — MIDAZOLAM HCL 2 MG/2ML IJ SOLN
INTRAMUSCULAR | Status: AC
  Filled 2018-06-22: qty 2

## 2018-06-22 MED ORDER — FENTANYL CITRATE (PF) 100 MCG/2ML IJ SOLN
INTRAMUSCULAR | Status: AC
  Filled 2018-06-22: qty 2

## 2018-06-22 MED ORDER — OXYCODONE HCL 5 MG PO TABS: 5.0000 mg | ORAL_TABLET | Freq: Once | ORAL | Status: DC | PRN

## 2018-06-22 MED ORDER — PHENYLEPHRINE 40 MCG/ML (10ML) SYRINGE FOR IV PUSH (FOR BLOOD PRESSURE SUPPORT)
PREFILLED_SYRINGE | INTRAVENOUS | Status: DC | PRN
  Administered 2018-06-22: 100 ug via INTRAVENOUS
  Administered 2018-06-22: 80 ug via INTRAVENOUS
  Administered 2018-06-22: 100 ug via INTRAVENOUS

## 2018-06-22 MED ORDER — FENTANYL CITRATE (PF) 100 MCG/2ML IJ SOLN
INTRAMUSCULAR | Status: DC | PRN
  Administered 2018-06-22: 50 ug via INTRAVENOUS

## 2018-06-22 MED ORDER — LIDOCAINE 2% (20 MG/ML) 5 ML SYRINGE
INTRAMUSCULAR | Status: AC
  Filled 2018-06-22: qty 5

## 2018-06-22 MED ORDER — DEXAMETHASONE SODIUM PHOSPHATE 10 MG/ML IJ SOLN
INTRAMUSCULAR | Status: DC | PRN
  Administered 2018-06-22: 10 mg via INTRAVENOUS

## 2018-06-22 MED ORDER — FENTANYL CITRATE (PF) 100 MCG/2ML IJ SOLN: 25.0000 ug | INTRAMUSCULAR | Status: DC | PRN

## 2018-06-22 MED ORDER — ONDANSETRON HCL 4 MG/2ML IJ SOLN
INTRAMUSCULAR | Status: AC
  Filled 2018-06-22: qty 2

## 2018-06-22 MED ORDER — OXYMETAZOLINE HCL 0.05 % NA SOLN
NASAL | Status: AC
  Filled 2018-06-22: qty 30

## 2018-06-22 MED ORDER — LACTATED RINGERS IV SOLN
INTRAVENOUS | Status: DC
  Administered 2018-06-22: 07:00:00 via INTRAVENOUS

## 2018-06-22 MED ORDER — FENTANYL CITRATE (PF) 100 MCG/2ML IJ SOLN: 50.0000 ug | INTRAMUSCULAR | Status: DC | PRN

## 2018-06-22 MED ORDER — SUCCINYLCHOLINE CHLORIDE 200 MG/10ML IV SOSY
PREFILLED_SYRINGE | INTRAVENOUS | Status: AC
  Filled 2018-06-22: qty 10

## 2018-06-22 MED ORDER — LIDOCAINE HCL (CARDIAC) PF 100 MG/5ML IV SOSY
PREFILLED_SYRINGE | INTRAVENOUS | Status: DC | PRN
  Administered 2018-06-22: 60 mg via INTRAVENOUS

## 2018-06-22 MED ORDER — SCOPOLAMINE 1 MG/3DAYS TD PT72: 1.0000 | MEDICATED_PATCH | Freq: Once | TRANSDERMAL | Status: DC | PRN

## 2018-06-22 MED ORDER — SUCCINYLCHOLINE CHLORIDE 200 MG/10ML IV SOSY
PREFILLED_SYRINGE | INTRAVENOUS | Status: DC | PRN
  Administered 2018-06-22: 120 mg via INTRAVENOUS

## 2018-06-22 MED ORDER — PROPOFOL 10 MG/ML IV BOLUS
INTRAVENOUS | Status: AC
  Filled 2018-06-22: qty 20

## 2018-06-22 MED ORDER — DEXAMETHASONE SODIUM PHOSPHATE 10 MG/ML IJ SOLN
INTRAMUSCULAR | Status: AC
  Filled 2018-06-22: qty 1

## 2018-06-22 MED ORDER — MIDAZOLAM HCL 2 MG/2ML IJ SOLN: 1.0000 mg | INTRAMUSCULAR | Status: DC | PRN

## 2018-06-22 MED ORDER — ONDANSETRON HCL 4 MG/2ML IJ SOLN: 4.0000 mg | Freq: Once | INTRAMUSCULAR | Status: DC | PRN

## 2018-06-22 MED ORDER — EPINEPHRINE 30 MG/30ML IJ SOLN
INTRAMUSCULAR | Status: AC
  Filled 2018-06-22: qty 1

## 2018-06-22 SURGICAL SUPPLY — 40 items
BALLN PULM 12 13.5 15X75 (BALLOONS)
BALLN PULM 12 13.5 15X75CM (BALLOONS)
BALLN PULM 15 16.5 18 X 75CM (BALLOONS) ×1
BALLN PULM 15 16.5 18X75 (BALLOONS) ×3
BALLN PULMONARY 10 12MM (MISCELLANEOUS)
BALLN PULMONARY 10-12 (MISCELLANEOUS) IMPLANT
BALLN PULMONARY 8 10 OD75CM (MISCELLANEOUS)
BALLN PULMONARY 8-10 OD75 (MISCELLANEOUS) IMPLANT
BALLOON PULM 12 13.5 15X75 (BALLOONS) IMPLANT
BALLOON PULM 15 16.5 18X75 (BALLOONS) ×1 IMPLANT
CANISTER SUCT 1200ML W/VALVE (MISCELLANEOUS) ×4 IMPLANT
CONT SPEC 4OZ CLIKSEAL STRL BL (MISCELLANEOUS) ×4 IMPLANT
COVER WAND RF STERILE (DRAPES) IMPLANT
GAUZE SPONGE 4X4 12PLY STRL LF (GAUZE/BANDAGES/DRESSINGS) ×4 IMPLANT
GLOVE BIO SURGEON STRL SZ 6.5 (GLOVE) ×2 IMPLANT
GLOVE BIO SURGEON STRL SZ7.5 (GLOVE) ×4 IMPLANT
GLOVE BIO SURGEONS STRL SZ 6.5 (GLOVE) ×1
GOWN STRL REUS W/ TWL LRG LVL3 (GOWN DISPOSABLE) ×2 IMPLANT
GOWN STRL REUS W/TWL LRG LVL3 (GOWN DISPOSABLE) ×4
GUARD TEETH (MISCELLANEOUS) ×1 IMPLANT
IV NS 500ML (IV SOLUTION)
IV NS 500ML BAXH (IV SOLUTION) ×1 IMPLANT
MARKER SKIN DUAL TIP RULER LAB (MISCELLANEOUS) ×4 IMPLANT
NDL HYPO 18GX1.5 BLUNT FILL (NEEDLE) ×1 IMPLANT
NDL SPNL 22GX7 QUINCKE BK (NEEDLE) IMPLANT
NEEDLE HYPO 18GX1.5 BLUNT FILL (NEEDLE) ×4 IMPLANT
NEEDLE SPNL 22GX7 QUINCKE BK (NEEDLE) IMPLANT
NS IRRIG 1000ML POUR BTL (IV SOLUTION) ×4 IMPLANT
PACK BASIN DAY SURGERY FS (CUSTOM PROCEDURE TRAY) ×1 IMPLANT
PATTIES SURGICAL .5 X3 (DISPOSABLE) ×4 IMPLANT
SHEET MEDIUM DRAPE 40X70 STRL (DRAPES) ×4 IMPLANT
SOLUTION ANTI FOG 6CC (MISCELLANEOUS) ×4 IMPLANT
SOLUTION BUTLER CLEAR DIP (MISCELLANEOUS) ×1 IMPLANT
SURGILUBE 2OZ TUBE FLIPTOP (MISCELLANEOUS) ×4 IMPLANT
SYR 5ML LL (SYRINGE) ×4 IMPLANT
SYR CONTROL 10ML LL (SYRINGE) ×1 IMPLANT
SYR INFLATE BILIARY GAUGE (MISCELLANEOUS) ×3 IMPLANT
TOWEL GREEN STERILE FF (TOWEL DISPOSABLE) ×4 IMPLANT
TUBE CONNECTING 20'X1/4 (TUBING) ×1
TUBE CONNECTING 20X1/4 (TUBING) ×3 IMPLANT

## 2018-06-22 NOTE — Anesthesia Procedure Notes (Signed)
Procedure Name: Intubation Date/Time: 06/22/2018 7:44 AM Performed by: Raenette Rover, CRNA Pre-anesthesia Checklist: Patient identified, Emergency Drugs available, Suction available and Patient being monitored Patient Re-evaluated:Patient Re-evaluated prior to induction Oxygen Delivery Method: Circle system utilized Preoxygenation: Pre-oxygenation with 100% oxygen Induction Type: IV induction Ventilation: Mask ventilation without difficulty Laryngoscope Size: Mac and 3 Grade View: Grade I Tube type: Oral Tube size: 7.0 mm Number of attempts: 1 Airway Equipment and Method: Stylet Placement Confirmation: ETT inserted through vocal cords under direct vision,  positive ETCO2,  CO2 detector and breath sounds checked- equal and bilateral Secured at: 19 cm Tube secured with: Tape Dental Injury: Teeth and Oropharynx as per pre-operative assessment

## 2018-06-22 NOTE — Discharge Instructions (Signed)

## 2018-06-22 NOTE — Anesthesia Preprocedure Evaluation (Addendum)
Anesthesia Evaluation  Patient identified by MRN, date of birth, ID band Patient awake    Reviewed: Allergy & Precautions, NPO status , Patient's Chart, lab work & pertinent test results, reviewed documented beta blocker date and time   History of Anesthesia Complications Negative for: history of anesthetic complications  Airway Mallampati: II  TM Distance: >3 FB Neck ROM: Full    Dental  (+) Lower Dentures, Upper Dentures   Pulmonary neg pulmonary ROS,    breath sounds clear to auscultation       Cardiovascular hypertension, Pt. on medications and Pt. on home beta blockers +CHF  + dysrhythmias Atrial Fibrillation  Rhythm:Irregular Rate:Normal   '19 TTE - EF 55% to 60%. Grade 2 diastolic dysfunction. Trivial MR, mild TR.    Neuro/Psych negative neurological ROS  negative psych ROS   GI/Hepatic Neg liver ROS, GERD  Controlled, Esophageal stricture    Endo/Other  negative endocrine ROS  Renal/GU negative Renal ROS     Musculoskeletal negative musculoskeletal ROS (+)   Abdominal   Peds  Hematology negative hematology ROS (+)   Anesthesia Other Findings   Reproductive/Obstetrics                            Anesthesia Physical Anesthesia Plan  ASA: III  Anesthesia Plan: General   Post-op Pain Management:    Induction: Intravenous  PONV Risk Score and Plan: 2 and Treatment may vary due to age or medical condition, Ondansetron and Dexamethasone  Airway Management Planned: Oral ETT  Additional Equipment: None  Intra-op Plan:   Post-operative Plan: Extubation in OR  Informed Consent: I have reviewed the patients History and Physical, chart, labs and discussed the procedure including the risks, benefits and alternatives for the proposed anesthesia with the patient or authorized representative who has indicated his/her understanding and acceptance.     Dental advisory  given  Plan Discussed with: CRNA and Anesthesiologist  Anesthesia Plan Comments:        Anesthesia Quick Evaluation

## 2018-06-22 NOTE — Anesthesia Postprocedure Evaluation (Signed)
Anesthesia Post Note  Patient: Mackenzie [name]  Procedure(s) Performed: RIGID ESOPHAGOSCOPY (N/A Throat) ESOPHAGEAL BALLOON DILATION (N/A Throat)     Patient location during evaluation: PACU Anesthesia Type: General Level of consciousness: awake Pain management: pain level controlled Vital Signs Assessment: post-procedure vital signs reviewed and stable Respiratory status: spontaneous breathing Cardiovascular status: stable Postop Assessment: no apparent nausea or vomiting Anesthetic complications: no    Last Vitals:  Vitals:   06/22/18 0845 06/22/18 0900  BP: (!) 164/70 (!) 161/63  Pulse: (!) 53 (!) 54  Resp: 17 20  Temp:  36.5 C  SpO2: 100% 100%    Last Pain:  Vitals:   06/22/18 0900  TempSrc:   PainSc: 4    Pain Goal: Patients Stated Pain Goal: 6 (06/22/18 0639)                 Huston Foley

## 2018-06-22 NOTE — Op Note (Signed)
NAME: Mackenzie [name], Mackenzie [name] MEDICAL RECORD [id number] ACCOUNT [id number] DATE OF BIRTH:29-Dec-1950 FACILITY: [facility] LOCATION: [facility] PHYSICIAN:Undrea Shipes Guido Sander, MD  OPERATIVE REPORT  DATE OF PROCEDURE:  06/22/2018  PREOPERATIVE DIAGNOSIS:  Esophageal stricture.  POSTOPERATIVE DIAGNOSIS:  Esophageal stricture.  PROCEDURE:  Rigid esophagoscopy with balloon dilation.  SURGEON:  Melida Quitter, MD  ANESTHESIA:  General endotracheal anesthesia.  COMPLICATIONS:  None.  INDICATIONS:  The patient is a 67 year old female with a history of upper esophageal stricture who has been treated with serial dilation with the last being almost a year ago.  She presents to the operating room for surgical management with recurrence of  symptoms.  FINDINGS:  She was found to have a 90% stricture at her usual location in the upper esophagus and was dilated to 18 mm by the balloon dilator without significant injury.  DESCRIPTION OF PROCEDURE:  The patient was identified in the holding room, informed consent having been obtained including discussion of risks, benefits, and alternatives, the patient was brought to the operative suite and put the operative table in  supine position.  Anesthesia was induced.  The patient was intubated by the anesthesia team without difficulty.  The patient was not given any antibiotics.  The eyes were taped closed and a damp gauze was placed over the upper gum.  A cervical  esophagoscope was then passed down through the mouth into the upper esophagus until the stricture was identified.  The 18 mm balloon was then inserted through the cervical esophagoscope and was first dilated to 5 atmospheres for about 30 seconds and then  deflated.  It was then dilated to 7 atmospheres for another 30 seconds, resulting in a wide dilation that allowed the cervical esophagoscope to be passed through it.  The esophagus was suctioned as the esophagoscope was backed out and no significant  injury aside  from mucosal tear was seen.  The esophagoscope was removed as was a damp gauze.    The patient was returned to anesthesia for wakeup was extubated and moved to recovery room in stable condition.  AN/NUANCE  D:06/22/2018 T:06/22/2018 JOB:[id number]

## 2018-06-22 NOTE — Transfer of Care (Signed)
Immediate Anesthesia Transfer of Care Note  Patient: Mackenzie [name]  Procedure(s) Performed: RIGID ESOPHAGOSCOPY (N/A Throat) ESOPHAGEAL BALLOON DILATION (N/A Throat)  Patient Location: PACU  Anesthesia Type:General  Level of Consciousness: drowsy  Airway & Oxygen Therapy: Patient Spontanous Breathing and Patient connected to face mask oxygen  Post-op Assessment: Report given to RN and Post -op Vital signs reviewed and stable  Post vital signs: Reviewed and stable  Last Vitals:  Vitals Value Taken Time  BP 146/67 06/22/2018  8:10 AM  Temp    Pulse 50 06/22/2018  8:11 AM  Resp 13 06/22/2018  8:11 AM  SpO2 100 % 06/22/2018  8:11 AM  Vitals shown include unvalidated device data.  Last Pain:  Vitals:   06/22/18 0639  TempSrc: Oral  PainSc: 0-No pain      Patients Stated Pain Goal: 6 (69/86/14 8307)  Complications: No apparent anesthesia complications

## 2018-06-22 NOTE — Brief Op Note (Signed)
06/22/2018  7:58 AM  PATIENT:  Mackenzie [name]  68 y.o. female  PRE-OPERATIVE DIAGNOSIS:  esophageal stricture  POST-OPERATIVE DIAGNOSIS:  esophageal stricture  PROCEDURE:  Procedure(s): RIGID ESOPHAGOSCOPY (N/A) WITH ESOPHAGEAL DILATION (N/A)  SURGEON:  Surgeon(s) and Role:    Melida Quitter, MD - Primary  PHYSICIAN ASSISTANT:   ASSISTANTS: none   ANESTHESIA:   general  EBL: Minimal  BLOOD ADMINISTERED:none  DRAINS: none   LOCAL MEDICATIONS USED:  NONE  SPECIMEN:  No Specimen  DISPOSITION OF SPECIMEN:  N/A  COUNTS:  YES  TOURNIQUET:  * No tourniquets in log *  DICTATION: .Other Dictation: Dictation Number [id number]  PLAN OF CARE: Discharge to home after PACU  PATIENT DISPOSITION:  PACU - hemodynamically stable.   Delay start of Pharmacological VTE agent (>24hrs) due to surgical blood loss or risk of bleeding: no

## 2018-06-22 NOTE — H&P (Signed)
Mackenzie [name] is an 68 y.o. female.   Chief Complaint: Esophageal stricture HPI: 68 year old female with upper esophageal stricture requiring serial dilation.  Her last dilation was about one year ago.  She has a few months of worsening symptoms.  Past Medical History:  Diagnosis Date  . Chronic systolic CHF (congestive heart failure) ([place])    a. 2D ECHO on 02/10/15 w/ severe LV dysfunction with EF 25-30%. Mild MR and mild LAE. negative nuclear stress test, felt to be due to tachycardia   . Dyspnea    with exertion  . Dysrhythmia    PAF  . Esophageal stricture    a. requiring dilations. crushes pills to take PO  . Full dentures   . GERD (gastroesophageal reflux disease)   . Osteoporosis    sees Dr. Carrolyn Meiers   . PAF (paroxysmal atrial fibrillation) ([place])    a. s/p succesfful DCCV on 02/13/15  . Pneumonia    (08/15/16)- many years ago  . Sebaceous cyst    on back of neck, has seen Dr. Ronnald Collum    Past Surgical History:  Procedure Laterality Date  . BREAST SURGERY Bilateral    Breast Implants  . CARDIOVERSION N/A 02/13/2015   Procedure: CARDIOVERSION;  Surgeon: Pixie Casino, MD;  Location: [facility];  Service: Cardiovascular;  Laterality: N/A;  . COLONOSCOPY  09/24/2002  . ESOPHAGEAL DILATION N/A 08/19/2016   Procedure: ESOPHAGEAL DILATION;  Surgeon: Melida Quitter, MD;  Location: [place];  Service: ENT;  Laterality: N/A;  esophagoscopy with balloon dilation  . ESOPHAGOGASTRODUODENOSCOPY  06/01/2003   12/13  . ESOPHAGOGASTRODUODENOSCOPY (EGD) WITH ESOPHAGEAL DILATION    . ESOPHAGOSCOPY WITH DILITATION N/A 06/16/2017   Procedure: ESOPHAGOSCOPY WITH BALLOON DILITATION;  Surgeon: Melida Quitter, MD;  Location: [place];  Service: ENT;  Laterality: N/A;  . RIGID ESOPHAGOSCOPY N/A 01/18/2013   Procedure: RIGID ESOPHAGOSCOPY WITH ESPHAGEAL Balloon DILATION ;  Surgeon: Melida Quitter, MD;  Location: [place];  Service: ENT;  Laterality: N/A;  . TEE  WITHOUT CARDIOVERSION N/A 02/13/2015   Procedure: TRANSESOPHAGEAL ECHOCARDIOGRAM (TEE);  Surgeon: Pixie Casino, MD;  Location: [facility] ENDOSCOPY;  Service: Cardiovascular;  Laterality: N/A;    Family History  Problem Relation Age of Onset  . Heart attack Mother 79       died from massive heart attack  . Aneurysm Father        cerebral  . Hypertension Sister   . Diabetes type II Sister   . Heart attack Brother   . Hypertension Sister   . Heart attack Brother   . Stroke Neg Hx    Social History:  reports that she has never smoked. She has never used smokeless tobacco. She reports that she does not drink alcohol or use drugs.  Allergies:  Allergies  Allergen Reactions  . Ramipril Cough    Medications Prior to Admission  Medication Sig Dispense Refill  . amiodarone (PACERONE) 200 MG tablet Take 0.5 tablets (100 mg total) by mouth daily. 30 tablet 6  . chlorthalidone (HYGROTON) 25 MG tablet Take 0.5 tablets (12.5 mg total) by mouth daily. 15 tablet 11  . lansoprazole (PREVACID) 30 MG capsule TAKE 1 CAPSULE BY MOUTH EVERY DAY 30 capsule 11  . losartan (COZAAR) 100 MG tablet Take 1 tablet (100 mg total) by mouth daily. 30 tablet 11  . metoprolol tartrate (LOPRESSOR) 25 MG tablet TAKE 1/2 TABLET TWICE DAILY 30 tablet 10  . Rivaroxaban (XARELTO) 15 MG  TABS tablet TAKE 1 TABLET (15 MG TOTAL) BY MOUTH DAILY WITH SUPPER. NOTE DOSE CHANGE 30 tablet 6  . temazepam (RESTORIL) 30 MG capsule TAKE ONE CAPSULE BY MOUTH AT BEDTIME AS NEEDED FOR SLEEP (Patient taking differently: Take 15mg s by mouth at bedtime as needed for sleep) 30 capsule 5    No results found for this or any previous visit (from the past 48 hour(s)). No results found.  Review of Systems  All other systems reviewed and are negative.   Blood pressure (!) 177/78, pulse (!) 58, temperature 98.1 F (36.7 C), temperature source Oral, resp. rate 16, height 5\' 1"  (1.549 m), weight 45.9 kg, SpO2 100 %. Physical Exam   Constitutional: She is oriented to person, place, and time. She appears well-developed and well-nourished. No distress.  HENT:  Head: Normocephalic and atraumatic.  Right Ear: External ear normal.  Left Ear: External ear normal.  Nose: Nose normal.  Mouth/Throat: Oropharynx is clear and moist.  Eyes: Pupils are equal, round, and reactive to light. Conjunctivae and EOM are normal.  Neck: Normal range of motion. Neck supple.  Cardiovascular: Normal rate.  Respiratory: Effort normal.  Neurological: She is alert and oriented to person, place, and time. No cranial nerve deficit.  Skin: Skin is warm and dry.  Psychiatric: She has a normal mood and affect. Her behavior is normal. Judgment and thought content normal.     Assessment/Plan Esophageal stricture  To OR for esophagoscopy with dilation.  Melida Quitter, MD 06/22/2018, 7:27 AM

## 2018-06-23 ENCOUNTER — Encounter (HOSPITAL_BASED_OUTPATIENT_CLINIC_OR_DEPARTMENT_OTHER): Payer: Self-pay | Admitting: Otolaryngology

## 2018-07-02 ENCOUNTER — Ambulatory Visit: Payer: BLUE CROSS/BLUE SHIELD | Admitting: Cardiology

## 2018-07-24 ENCOUNTER — Other Ambulatory Visit: Payer: Self-pay

## 2018-07-24 DIAGNOSIS — I5021 Acute systolic (congestive) heart failure: Secondary | ICD-10-CM

## 2018-07-24 MED ORDER — AMIODARONE HCL 200 MG PO TABS: 100.0000 mg | ORAL_TABLET | Freq: Every day | ORAL | 6 refills | Status: DC

## 2018-08-24 ENCOUNTER — Ambulatory Visit: Payer: BLUE CROSS/BLUE SHIELD | Admitting: Internal Medicine

## 2018-09-01 ENCOUNTER — Other Ambulatory Visit: Payer: Self-pay

## 2018-09-01 MED ORDER — CHLORTHALIDONE 25 MG PO TABS: 12.5000 mg | ORAL_TABLET | Freq: Every day | ORAL | 8 refills | Status: DC

## 2018-09-01 MED ORDER — LOSARTAN POTASSIUM 100 MG PO TABS: 100.0000 mg | ORAL_TABLET | Freq: Every day | ORAL | 8 refills | Status: DC

## 2018-09-24 ENCOUNTER — Ambulatory Visit: Payer: Self-pay | Admitting: *Deleted

## 2018-09-24 NOTE — Telephone Encounter (Signed)
Pt calling with concerns of COVID-19 exposure. Pt states she was around a coworker on Monday whose son tested positive for COVID-19. Pt states she did not have close contact with the coworker but the coworker did go home on Monday with a runny nose and fever.  Pt states that she does not have any symptoms currently. Pt states she does have a history of HF. Pt given home care advice and advised to return call to office if symptoms develop. Understanding verbalized.   Reason for Disposition . [1] Caller concerned that exposure to COVID-19 occurred BUT [2] does not meet COVID-19 EXPOSURE criteria from [facility]  Answer Assessment - Initial Assessment Questions 1. CLOSE CONTACT: "Who is the person with the confirmed or suspected COVID-19 infection that you were exposed to?"     Coworker's son tested positive 2. PLACE of CONTACT: "Where were you when you were exposed to COVID-19?" (e.g., home, school, medical waiting room; which city?)     At work but denies close contact 3. TYPE of CONTACT: "How much contact was there?" (e.g., sitting next to, live in same house, work in same office, same building)     Works in the same place but no close contact 4. DURATION of CONTACT: "How long were you in contact with the COVID-19 patient?" (e.g., a few seconds, passed by person, a few minutes, live with the patient)     No close contact 5. DATE of CONTACT: "When did you have contact with a COVID-19 patient?" (e.g., how many days ago)     Monday 6. TRAVEL: "Have you traveled out of the country recently?" If so, "When and where?"     * Also ask about out-of-state travel, since the CDC has identified some high-risk cities for community spread in the [place].     * Note: Travel becomes less relevant if there is widespread community transmission where the patient lives.     Not assessed 7. COMMUNITY SPREAD: "Are there lots of cases of COVID-19 (community spread) where you live?" (See public health department website, if unsure)        yes 8. SYMPTOMS: "Do you have any symptoms?" (e.g., fever, cough, breathing difficulty)     no 9. PREGNANCY OR POSTPARTUM: "Is there any chance you are pregnant?" "When was your last menstrual period?" "Did you deliver in the last 2 weeks?"     n/a 10. HIGH RISK: "Do you have any heart or lung problems? Do you have a weak immune system?" (e.g., CHF, COPD, asthma, HIV positive, chemotherapy, renal failure, diabetes mellitus, sickle cell anemia)       Heart failure  Protocols used: CORONAVIRUS (COVID-19) EXPOSURE-A-AH

## 2018-09-25 DIAGNOSIS — I10 Essential (primary) hypertension: Secondary | ICD-10-CM | POA: Diagnosis not present

## 2018-09-25 DIAGNOSIS — I4891 Unspecified atrial fibrillation: Secondary | ICD-10-CM | POA: Diagnosis not present

## 2018-09-25 DIAGNOSIS — Z2089 Contact with and (suspected) exposure to other communicable diseases: Secondary | ICD-10-CM | POA: Diagnosis not present

## 2018-11-30 ENCOUNTER — Other Ambulatory Visit: Payer: Self-pay

## 2018-11-30 MED ORDER — METOPROLOL TARTRATE 25 MG PO TABS: 12.5000 mg | ORAL_TABLET | Freq: Two times a day (BID) | ORAL | 10 refills | Status: DC

## 2018-12-24 ENCOUNTER — Other Ambulatory Visit: Payer: Self-pay | Admitting: Family Medicine

## 2019-01-17 ENCOUNTER — Other Ambulatory Visit: Payer: Self-pay | Admitting: Family Medicine

## 2019-01-27 ENCOUNTER — Telehealth: Payer: Self-pay | Admitting: *Deleted

## 2019-01-27 NOTE — Telephone Encounter (Signed)
Spoke with patient and let her know we have been in contact with the [employer] monitor company to troubleshoot we her insurance company denied claim.  This might be due to her Medicare is part A only.  If this is the case her monitor should have been changed to a CEM instead of [employer] , which would be covered by her BCBS.  This information has been relayed to the [facility] billing department.  I will also reach out to our [employer] rep Earlyne Iba [phone].

## 2019-01-28 ENCOUNTER — Telehealth: Payer: Self-pay | Admitting: *Deleted

## 2019-01-28 NOTE — Telephone Encounter (Signed)
[employer] representative, Earlyne Iba, sent a message this morning regarding any amount due from 11/19 cardiac event monitor.  He state they are showing patient as unbilled with a charge balance of $0.  He stated it was likely the patient received an EOB from their insurance, but they will not receive any bill from [employer].

## 2019-02-05 ENCOUNTER — Ambulatory Visit: Payer: BLUE CROSS/BLUE SHIELD | Admitting: Cardiology

## 2019-02-16 ENCOUNTER — Other Ambulatory Visit: Payer: Self-pay | Admitting: Family Medicine

## 2019-02-18 NOTE — Progress Notes (Signed)
Cardiology Office Note   Date:  02/22/2019   ID:  Mackenzie, [name] October 23, 1950, MRN [id number]  PCP:  Laurey Morale, MD  Cardiologist:  Dr. Martinique     History of Present Illness: Mackenzie [name] is a 68 y.o. female seen for follow up Afib. she has a history of HTN, GERD, and esophageal stricture with previous dilations. She has a history of Afib and is on chronic amiodarone.  She presented to [facility] on 02/09/15 with SOB and palpitations. She was found to have new onset atrial fibrillation w/ RVR w/ evidence of CHF and mildly elevated troponin.   A 2D ECHO was ordered which revealed severe LV dysfunction with EF 25-30%. Mild MR and mild LAE. She underwent a nuclear stress test on 02/11/15 which was negative for ischemia. EF 21%. Her severe LV dysfunction was felt to be due to tachycardia and restoration of NSR important. She underwent successful TEE/DCCV on 02/13/15.  On 02/22/15 she was noted to be in recurrent atrial fibrillation w/ CVR with minimal sx aside from fatigue. She was started on amiodarone 200mg  BID and scheduled for follow up with  with plans for DCCV if she remained in afib. On subsequent follow up she had converted to NSR. Echo in Jan. 2017 showed normal LV function with EF 55-60%.   She did undergo esophageal dilitation for stricture on 08/19/16 and in  March 2019 and again in March 2020.   She was seen in July 2020 with multiple complaints.  She reported increased dyspnea on exertion. Had dizziness and has some neck discomfort. Her BP monitor showed an irregular pulse at times and HR will be up to 90-110. BP is labile with occ. BP up to [id number] systolic but at other times it will be down to 88 systolic. Most of the time her BP is normal.  These symptoms were similar to when she had Afib. Labs were OK. Echo was unchanged. PFTs showed moderately reduce diffusion capacity and moderate obstructive airway disease. Event monitor was placed. She did have an episode of AFib in November  lasting 75 seconds.   She wore an event monitor 02/2018 which showed paroxysmal atrial fibrillation w/ controlled rates, afib burden 11%. She was seen by Dr Rayann Heman and initially scheduled for Afib ablation but the patient cancelled the procedure as she felt she was doing better. She did have dilation of an esophageal stricture in March 2020.  She states on follow up today that she is doing OK. Still has Afib every once in a while and it may last all day. She feels pressure in her head and ears and notes BP drops. She just sits or lies down until it resolves. Breathing is doing OK. She does get some SOB going up stairs. She notes BP is much better at home. Was [id number] systolic this am.   Past Medical History:  Diagnosis Date  . Chronic systolic CHF (congestive heart failure) ([place])    a. 2D ECHO on 02/10/15 w/ severe LV dysfunction with EF 25-30%. Mild MR and mild LAE. negative nuclear stress test, felt to be due to tachycardia   . Dyspnea    with exertion  . Dysrhythmia    PAF  . Esophageal stricture    a. requiring dilations. crushes pills to take PO  . Full dentures   . GERD (gastroesophageal reflux disease)   . Osteoporosis    sees Dr. Carrolyn Meiers   . PAF (paroxysmal atrial fibrillation) ([place])  a. s/p succesfful DCCV on 02/13/15  . Pneumonia    (08/15/16)- many years ago  . Sebaceous cyst    on back of neck, has seen Dr. Ronnald Collum    Past Surgical History:  Procedure Laterality Date  . BALLOON DILATION N/A 06/22/2018   Procedure: ESOPHAGEAL BALLOON DILATION;  Surgeon: Melida Quitter, MD;  Location: [place];  Service: ENT;  Laterality: N/A;  . BREAST SURGERY Bilateral    Breast Implants  . CARDIOVERSION N/A 02/13/2015   Procedure: CARDIOVERSION;  Surgeon: Pixie Casino, MD;  Location: [place];  Service: Cardiovascular;  Laterality: N/A;  . COLONOSCOPY  09/24/2002  . ESOPHAGEAL DILATION N/A 08/19/2016   Procedure: ESOPHAGEAL DILATION;  Surgeon: Melida Quitter,  MD;  Location: [place];  Service: ENT;  Laterality: N/A;  esophagoscopy with balloon dilation  . ESOPHAGOGASTRODUODENOSCOPY  06/01/2003   12/13  . ESOPHAGOGASTRODUODENOSCOPY (EGD) WITH ESOPHAGEAL DILATION    . ESOPHAGOSCOPY WITH DILITATION N/A 06/16/2017   Procedure: ESOPHAGOSCOPY WITH BALLOON DILITATION;  Surgeon: Melida Quitter, MD;  Location: [place];  Service: ENT;  Laterality: N/A;  . RIGID ESOPHAGOSCOPY N/A 01/18/2013   Procedure: RIGID ESOPHAGOSCOPY WITH ESPHAGEAL Balloon DILATION ;  Surgeon: Melida Quitter, MD;  Location: [place];  Service: ENT;  Laterality: N/A;  . RIGID ESOPHAGOSCOPY N/A 06/22/2018   Procedure: RIGID ESOPHAGOSCOPY;  Surgeon: Melida Quitter, MD;  Location: [place];  Service: ENT;  Laterality: N/A;  . TEE WITHOUT CARDIOVERSION N/A 02/13/2015   Procedure: TRANSESOPHAGEAL ECHOCARDIOGRAM (TEE);  Surgeon: Pixie Casino, MD;  Location: [facility] ENDOSCOPY;  Service: Cardiovascular;  Laterality: N/A;     Current Outpatient Medications  Medication Sig Dispense Refill  . lansoprazole (PREVACID) 30 MG capsule TAKE 1 CAPSULE BY MOUTH EVERY DAY 30 capsule 0  . temazepam (RESTORIL) 30 MG capsule TAKE ONE CAPSULE BY MOUTH AT BEDTIME AS NEEDED FOR SLEEP (Patient taking differently: Take 15mg s by mouth at bedtime as needed for sleep) 30 capsule 5  . amiodarone (PACERONE) 200 MG tablet Take 0.5 tablets (100 mg total) by mouth daily. 45 tablet 3  . chlorthalidone (HYGROTON) 25 MG tablet Take 0.5 tablets (12.5 mg total) by mouth daily. 45 tablet 3  . losartan (COZAAR) 100 MG tablet Take 1 tablet (100 mg total) by mouth daily. 90 tablet 3  . metoprolol tartrate (LOPRESSOR) 25 MG tablet Take 0.5 tablets (12.5 mg total) by mouth 2 (two) times daily. 90 tablet 3  . Rivaroxaban (XARELTO) 15 MG TABS tablet TAKE 1 TABLET (15 MG TOTAL) BY MOUTH DAILY WITH SUPPER. NOTE DOSE CHANGE 90 tablet 3   No current facility-administered medications for this visit.      Allergies:   Ramipril    Social History:  The patient  reports that she has never smoked. She has never used smokeless tobacco. She reports that she does not drink alcohol or use drugs.   Family History:  The patient's family history includes Aneurysm in her father; Diabetes type II in her sister; Heart attack in her brother and brother; Heart attack (age of onset: 59) in her mother; Hypertension in her sister and sister.    ROS:  Please see the history of present illness.   Otherwise, review of systems are positive for NONE.   All other systems are reviewed and negative.    PHYSICAL EXAM: VS:  BP (!) 167/75   Pulse 60   Temp 97.9 F (36.6 C)   Ht 5\' 1"  (1.549 m)  Wt 105 lb (47.6 kg)   SpO2 99%   BMI 19.84 kg/m  , BMI Body mass index is 19.84 kg/m. GENERAL:  Well appearing thin WF in NAD HEENT:  PERRL, EOMI, sclera are clear. Oropharynx is clear. NECK:  No jugular venous distention, carotid upstroke brisk and symmetric, no bruits, no thyromegaly or adenopathy LUNGS:  Clear to auscultation bilaterally CHEST:  Unremarkable HEART:  RRR,  PMI not displaced or sustained,S1 and S2 within normal limits, no S3, no S4: no clicks, no rubs, no murmurs ABD:  Soft, nontender. BS +, no masses or bruits. No hepatomegaly, no splenomegaly EXT:  2 + pulses throughout, no edema, no cyanosis no clubbing SKIN:  Warm and dry.  No rashes NEURO:  Alert and oriented x 3. Cranial nerves II through XII intact. PSYCH:  Cognitively intact    EKG:  EKG is  ordered today. NSR rate 59. Nonspecific ST abnormality. I have personally reviewed and interpreted this study.    Recent Labs: 04/22/2018: Hemoglobin 12.4; Platelets 272 06/18/2018: BUN 19; Creatinine, Ser 1.21; Potassium 4.4; Sodium 136    Lipid Panel    Component Value Date/Time   CHOL 218 (H) 09/02/2017 0854   TRIG 80 09/02/2017 0854   HDL 84 09/02/2017 0854   CHOLHDL 2.9 02/18/2017 0959   CHOLHDL 3.0 02/10/2015 0126   VLDL 12  02/10/2015 0126   LDLCALC 118 (H) 09/02/2017 0854   LDLDIRECT 121.0 03/14/2010 0931      Wt Readings from Last 3 Encounters:  02/22/19 105 lb (47.6 kg)  06/22/18 101 lb 3.1 oz (45.9 kg)  04/22/18 98 lb 12.8 oz (44.8 kg)       Other studies Reviewed: Additional studies/ records that were reviewed today include: Echo Review of the above records demonstrates:  Echo 05/10/15: Study Conclusions  - Left ventricle: The cavity size was normal. Wall thickness was normal. Systolic function was normal. The estimated ejection fraction was in the range of 55% to 60%. Wall motion was normal; there were no regional wall motion abnormalities. Doppler parameters are consistent with psuedonormal left ventricular relaxation (grade 2 diastolic dysfunction). The E/A ratio is >1.5. The E/e&' ratio is >20, suggesting markedly elevated LV filling pressure. - Mitral valve: Mildly thickened leaflets . - Left atrium: The atrium was normal in size. - Right ventricle: The cavity size was normal. Wall thickness was normal. Systolic function is reduced. Lateral annulus peak S velocity: 8.79 cm/s. - Atrial septum: No defect or patent foramen ovale was identified. - Tricuspid valve: There was mild regurgitation. - Inferior vena cava: The vessel was normal in size. The respirophasic diameter changes were in the normal range (= 50%), consistent with normal central venous pressure.  Impressions:  - Compared to a prior study in 01/2015, the EF has normalized. There is Grade 2 diastolic dysfunction with elevated LV filling pressure.  Echo 02/26/18: Study Conclusions  - Left ventricle: The cavity size was normal. Wall thickness was   normal. Systolic function was normal. The estimated ejection   fraction was in the range of 55% to 60%. Wall motion was normal;   there were no regional wall motion abnormalities. Doppler   parameters are consistent with abnormal left ventricular    relaxation (grade 2 diastolic dysfunction). The E/e&' ratio is   >15, suggesting elevated LV filling pressure. - Mitral valve: Mildly thickened leaflets . There was trivial   regurgitation. - Left atrium: The atrium was normal in size. - Tricuspid valve: There was mild regurgitation. -  Pulmonary arteries: PA peak pressure: 34 mm Hg (S). - Inferior vena cava: The vessel was normal in size. The   respirophasic diameter changes were in the normal range (>= 50%),   consistent with normal central venous pressure.  Impressions:  - Compared to a prior study in 2017, there are no significant   changes.  PFTs: Post-Test Comments: Good patient effort. The results of this test meet ATS standards for acceptability and repeatability. HHN given with 2.5mg  of Albuterol Hgb default value of 13.4 used. The FVC, FEV1, FEV1/FVC ratio and FEF25-75% are reduced indicating airway obstruction. The airway resistance is normal. The SVC is reduced, but the TLC is within normal limits. Following administration of bronchodilators, there is no significant response. The reduced diffusing capacity indicates a moderate loss of functional alveolar capillary surface. However, the diffusing capacity was not corrected for the patient's hemoglobin. Conclusions: Although there is airway obstruction and a diffusion defect suggesting emphysema, the absence of overinflation is inconsistent with that diagnosis. Pulmonary Function Diagnosis: Moderate Obstructive Airways Disease Insignificant response to bronchodilator Moderate Diffusion Defect  ASSESSMENT AND PLAN: 1. Atrial fibrillation-paroxysmal.  s/p TEE/DCCV on 02/13/15, but noted to be back in afib in office on 02/22/15. Subsequently converted to NSR on amiodarone. She is having some breakthrough and event monitor demonstrated a burden of 11%. . Considered for Afib ablation with Dr Rayann Heman but patient declined at this point and feels her symptoms are doing OK -- will  update labs today on amiodarone.  -- CHAD-vasc score of 3. Continue  Xarelto   2. Dyspnea on exertion. Multiple potential etiologies including  recurrent Afib and  COPD. PFTs suggest obstructive airway disease. CXR shows some hyperinflation. Moderately reduced diffusion capacity could be related to COPD or amiodarone toxicity. I have no baseline for comparison. Symptoms are fairly minor at this point.   3. HTN- BP is controlled per patient on home monitor.     4. GERD- continue PPI  5. Esophageal stricture s/p repeated dilation   Signed, Peter Martinique, MD  02/22/2019 11:04 AM    [address], [place], [place]  [address] Phone: [phone]; Fax: [phone]

## 2019-02-22 ENCOUNTER — Ambulatory Visit (INDEPENDENT_AMBULATORY_CARE_PROVIDER_SITE_OTHER): Payer: BC Managed Care – PPO | Admitting: Cardiology

## 2019-02-22 ENCOUNTER — Other Ambulatory Visit: Payer: Self-pay

## 2019-02-22 ENCOUNTER — Encounter: Payer: Self-pay | Admitting: Cardiology

## 2019-02-22 ENCOUNTER — Ambulatory Visit: Payer: BC Managed Care – PPO | Admitting: Family Medicine

## 2019-02-22 ENCOUNTER — Encounter: Payer: Self-pay | Admitting: Family Medicine

## 2019-02-22 VITALS — BP 167/75 | HR 60 | Temp 97.9°F | Ht 61.0 in | Wt 105.0 lb

## 2019-02-22 VITALS — BP 148/80 | HR 62 | Temp 98.2°F | Ht 61.0 in | Wt 105.0 lb

## 2019-02-22 DIAGNOSIS — I5022 Chronic systolic (congestive) heart failure: Secondary | ICD-10-CM | POA: Diagnosis not present

## 2019-02-22 DIAGNOSIS — G4700 Insomnia, unspecified: Secondary | ICD-10-CM

## 2019-02-22 DIAGNOSIS — R0600 Dyspnea, unspecified: Secondary | ICD-10-CM

## 2019-02-22 DIAGNOSIS — I10 Essential (primary) hypertension: Secondary | ICD-10-CM

## 2019-02-22 DIAGNOSIS — R0609 Other forms of dyspnea: Secondary | ICD-10-CM

## 2019-02-22 DIAGNOSIS — I480 Paroxysmal atrial fibrillation: Secondary | ICD-10-CM

## 2019-02-22 DIAGNOSIS — K219 Gastro-esophageal reflux disease without esophagitis: Secondary | ICD-10-CM | POA: Diagnosis not present

## 2019-02-22 DIAGNOSIS — L989 Disorder of the skin and subcutaneous tissue, unspecified: Secondary | ICD-10-CM

## 2019-02-22 DIAGNOSIS — Z23 Encounter for immunization: Secondary | ICD-10-CM

## 2019-02-22 LAB — CBC WITH DIFFERENTIAL/PLATELET
Basophils Absolute: 0 10*3/uL (ref 0.0–0.2)
Basos: 1 %
EOS (ABSOLUTE): 0.1 10*3/uL (ref 0.0–0.4)
Eos: 1 %
Hematocrit: 37.6 % (ref 34.0–46.6)
Hemoglobin: 12.3 g/dL (ref 11.1–15.9)
Immature Grans (Abs): 0 10*3/uL (ref 0.0–0.1)
Immature Granulocytes: 0 %
Lymphocytes Absolute: 1.6 10*3/uL (ref 0.7–3.1)
Lymphs: 27 %
MCH: 29.1 pg (ref 26.6–33.0)
MCHC: 32.7 g/dL (ref 31.5–35.7)
MCV: 89 fL (ref 79–97)
Monocytes Absolute: 0.6 10*3/uL (ref 0.1–0.9)
Monocytes: 9 %
Neutrophils Absolute: 3.7 10*3/uL (ref 1.4–7.0)
Neutrophils: 62 %
Platelets: 287 10*3/uL (ref 150–450)
RBC: 4.22 x10E6/uL (ref 3.77–5.28)
RDW: 12 % (ref 11.7–15.4)
WBC: 6 10*3/uL (ref 3.4–10.8)

## 2019-02-22 LAB — TSH: TSH: 3.73 u[IU]/mL (ref 0.450–4.500)

## 2019-02-22 LAB — LIPID PANEL
Chol/HDL Ratio: 2.7 ratio (ref 0.0–4.4)
Cholesterol, Total: 205 mg/dL — ABNORMAL HIGH (ref 100–199)
HDL: 77 mg/dL (ref >39)
LDL Chol Calc (NIH): 112 mg/dL — ABNORMAL HIGH (ref 0–99)
Triglycerides: 91 mg/dL (ref 0–149)
VLDL Cholesterol Cal: 16 mg/dL (ref 5–40)

## 2019-02-22 LAB — HEPATIC FUNCTION PANEL
ALT: 8 IU/L (ref 0–32)
AST: 18 IU/L (ref 0–40)
Albumin: 4.2 g/dL (ref 3.8–4.8)
Alkaline Phosphatase: 36 IU/L — ABNORMAL LOW (ref 39–117)
Bilirubin Total: 0.3 mg/dL (ref 0.0–1.2)
Bilirubin, Direct: 0.08 mg/dL (ref 0.00–0.40)
Total Protein: 7.6 g/dL (ref 6.0–8.5)

## 2019-02-22 LAB — BASIC METABOLIC PANEL
BUN/Creatinine Ratio: 14 (ref 12–28)
BUN: 17 mg/dL (ref 8–27)
CO2: 23 mmol/L (ref 20–29)
Calcium: 9.4 mg/dL (ref 8.7–10.3)
Chloride: 99 mmol/L (ref 96–106)
Creatinine, Ser: 1.2 mg/dL — ABNORMAL HIGH (ref 0.57–1.00)
GFR calc Af Amer: 53 mL/min/{1.73_m2} — ABNORMAL LOW (ref >59)
GFR calc non Af Amer: 46 mL/min/{1.73_m2} — ABNORMAL LOW (ref >59)
Glucose: 78 mg/dL (ref 65–99)
Potassium: 4.2 mmol/L (ref 3.5–5.2)
Sodium: 137 mmol/L (ref 134–144)

## 2019-02-22 MED ORDER — AMIODARONE HCL 200 MG PO TABS: 100.0000 mg | ORAL_TABLET | Freq: Every day | ORAL | 3 refills | Status: DC

## 2019-02-22 MED ORDER — TEMAZEPAM 30 MG PO CAPS: 30.0000 mg | ORAL_CAPSULE | Freq: Every evening | ORAL | 1 refills | Status: DC | PRN

## 2019-02-22 MED ORDER — RIVAROXABAN 15 MG PO TABS: ORAL_TABLET | ORAL | 3 refills | Status: DC

## 2019-02-22 MED ORDER — METOPROLOL TARTRATE 25 MG PO TABS: 12.5000 mg | ORAL_TABLET | Freq: Two times a day (BID) | ORAL | 3 refills | Status: DC

## 2019-02-22 MED ORDER — LANSOPRAZOLE 30 MG PO CPDR: 30.0000 mg | DELAYED_RELEASE_CAPSULE | Freq: Every day | ORAL | 3 refills | Status: DC

## 2019-02-22 MED ORDER — CHLORTHALIDONE 25 MG PO TABS: 12.5000 mg | ORAL_TABLET | Freq: Every day | ORAL | 3 refills | Status: DC

## 2019-02-22 MED ORDER — LOSARTAN POTASSIUM 100 MG PO TABS: 100.0000 mg | ORAL_TABLET | Freq: Every day | ORAL | 3 refills | Status: DC

## 2019-02-22 NOTE — Patient Instructions (Signed)
Health Maintenance Due  Topic Date Due  . Hepatitis C Screening  Jan 25, 1951  . TETANUS/TDAP  08/01/1968  . MAMMOGRAM  08/02/1999  . COLONOSCOPY  08/02/1999  . PNA vac Low Risk Adult (2 of 2 - PPSV23) 12/09/2018    Depression screen PHQ 2/9 12/12/2015  Decreased Interest 0  Down, Depressed, Hopeless 0  PHQ - 2 Score 0

## 2019-02-22 NOTE — Patient Instructions (Signed)
We will check labs today  Follow up in 6 months

## 2019-02-22 NOTE — Progress Notes (Signed)
   Subjective:    Patient ID: Mackenzie [name], female    DOB: 28-May-1950, 68 y.o.   MRN: [id number]  HPI Here to follow up on GERD and insomnia, and she asks me to check a lesion on the nose that appeared about one year ago. It does not seem to be changing. She saw Dr. Martinique this morning about her atrial fibrillation and BP, and she seems to be doing well on these issues. She still works 2 days a week.    Review of Systems  Constitutional: Negative.   Respiratory: Negative.   Cardiovascular: Negative.   Gastrointestinal: Negative.        Objective:   Physical Exam Constitutional:      Appearance: Normal appearance.  Cardiovascular:     Rate and Rhythm: Normal rate and regular rhythm.     Pulses: Normal pulses.     Heart sounds: Normal heart sounds.  Pulmonary:     Effort: Pulmonary effort is normal.     Breath sounds: Normal breath sounds.  Skin:    Comments: There is a small lesion on the left side of the nose with a raised pearly border  Neurological:     Mental Status: She is alert.           Assessment & Plan:  Her PAT is stable. Her GERD is stable so the Prevacid is refilled. Her insomnia is stable so the Temazepam is refilled. The nose lesion is likely a basal cell cancer so we will refer her to Dermatology to remove it.  [name], MD

## 2019-03-03 DIAGNOSIS — Z85828 Personal history of other malignant neoplasm of skin: Secondary | ICD-10-CM | POA: Diagnosis not present

## 2019-03-03 DIAGNOSIS — D485 Neoplasm of uncertain behavior of skin: Secondary | ICD-10-CM | POA: Diagnosis not present

## 2019-04-01 DIAGNOSIS — D485 Neoplasm of uncertain behavior of skin: Secondary | ICD-10-CM | POA: Diagnosis not present

## 2019-04-01 DIAGNOSIS — C44311 Basal cell carcinoma of skin of nose: Secondary | ICD-10-CM | POA: Diagnosis not present

## 2019-04-08 ENCOUNTER — Encounter: Payer: Self-pay | Admitting: Family Medicine

## 2019-04-08 ENCOUNTER — Other Ambulatory Visit: Payer: Self-pay

## 2019-04-08 ENCOUNTER — Ambulatory Visit: Payer: BC Managed Care – PPO | Admitting: Family Medicine

## 2019-04-08 VITALS — BP 126/80 | HR 65 | Resp 16 | Ht 61.0 in

## 2019-04-08 DIAGNOSIS — I480 Paroxysmal atrial fibrillation: Secondary | ICD-10-CM | POA: Diagnosis not present

## 2019-04-08 DIAGNOSIS — M545 Low back pain, unspecified: Secondary | ICD-10-CM

## 2019-04-08 DIAGNOSIS — K219 Gastro-esophageal reflux disease without esophagitis: Secondary | ICD-10-CM | POA: Diagnosis not present

## 2019-04-08 LAB — POCT URINALYSIS DIPSTICK
Bilirubin, UA: NEGATIVE
Blood, UA: NEGATIVE
Glucose, UA: NEGATIVE
Ketones, UA: NEGATIVE
Leukocytes, UA: NEGATIVE
Nitrite, UA: NEGATIVE
Protein, UA: POSITIVE — AB
Spec Grav, UA: 1.025 (ref 1.010–1.025)
Urobilinogen, UA: 0.2 E.U./dL
pH, UA: 5.5 (ref 5.0–8.0)

## 2019-04-08 MED ORDER — PREDNISONE 5 MG/5ML PO SOLN: ORAL | 0 refills | Status: AC

## 2019-04-08 MED ORDER — TRAMADOL HCL 50 MG PO TABS: 25.0000 mg | ORAL_TABLET | Freq: Every evening | ORAL | 0 refills | Status: AC | PRN

## 2019-04-08 MED ORDER — TIZANIDINE HCL 4 MG PO TABS: 4.0000 mg | ORAL_TABLET | Freq: Two times a day (BID) | ORAL | 0 refills | Status: AC | PRN

## 2019-04-08 NOTE — Progress Notes (Signed)
ACUTE VISIT   HPI:  Chief Complaint  Patient presents with  . Back Pain  . Pyelonephritis    [name] is a 68 y.o. female, who is here today complaining of 10 days of left-sided back pain, she is afraid this could be a UTI. She reported this problem is new.  Sharp constant pain, 10/10, interfering with sleep. Pain is exacerbated by prolonged sitting or prolonged standing, alleviated by changing position. Denies saddle anesthesia, lower extremity numbness/tingling/burning, or bladder/bowel dysfunction.  Pain is radiated to lateral aspect of LLE. Negative for fever, chills, fatigue, changes in appetite, abdominal pain, changes in bowel habits, vomiting, urinary symptoms, or rash. When pain is intense she feels nauseated. Problem seems to be stable. She has taking OTC Tylenol, which does not help. Local heat helps some.  She has history of atrial fibrillation, currently she is on metoprolol tartrate 12.5 mg twice daily and amiodarone 200 mg 1/2 tablet daily. GERD, she does not report any symptoms.  Currently she is on lansoprazole 30 mg daily.  Review of Systems  Constitutional: Negative for activity change and unexpected weight change.  Respiratory: Negative for cough, shortness of breath and wheezing.   Cardiovascular: Negative for leg swelling.  Gastrointestinal: Negative for abdominal distention and blood in stool.  Genitourinary: Negative for decreased urine volume, dysuria, hematuria, vaginal bleeding and vaginal discharge.       Negative for urine incontinence.  Musculoskeletal: Positive for back pain. Negative for arthralgias, joint swelling and neck pain.  Neurological: Negative for weakness and headaches.  Psychiatric/Behavioral: Positive for sleep disturbance. Negative for confusion.  Rest see pertinent positives and negatives per HPI.   Current Outpatient Medications on File Prior to Visit  Medication Sig Dispense Refill  . amiodarone (PACERONE)  200 MG tablet Take 0.5 tablets (100 mg total) by mouth daily. 45 tablet 3  . chlorthalidone (HYGROTON) 25 MG tablet Take 0.5 tablets (12.5 mg total) by mouth daily. 45 tablet 3  . lansoprazole (PREVACID) 30 MG capsule Take 1 capsule (30 mg total) by mouth daily. 90 capsule 3  . losartan (COZAAR) 100 MG tablet Take 1 tablet (100 mg total) by mouth daily. 90 tablet 3  . metoprolol tartrate (LOPRESSOR) 25 MG tablet Take 0.5 tablets (12.5 mg total) by mouth 2 (two) times daily. 90 tablet 3  . Rivaroxaban (XARELTO) 15 MG TABS tablet TAKE 1 TABLET (15 MG TOTAL) BY MOUTH DAILY WITH SUPPER. NOTE DOSE CHANGE 90 tablet 3  . temazepam (RESTORIL) 30 MG capsule Take 1 capsule (30 mg total) by mouth at bedtime as needed. for sleep 90 capsule 1   No current facility-administered medications on file prior to visit.     Past Medical History:  Diagnosis Date  . Chronic systolic CHF (congestive heart failure) ([place])    a. 2D ECHO on 02/10/15 w/ severe LV dysfunction with EF 25-30%. Mild MR and mild LAE. negative nuclear stress test, felt to be due to tachycardia   . Dyspnea    with exertion  . Dysrhythmia    PAF  . Esophageal stricture    a. requiring dilations. crushes pills to take PO  . Full dentures   . GERD (gastroesophageal reflux disease)   . Osteoporosis    sees Dr. Carrolyn Meiers   . PAF (paroxysmal atrial fibrillation) ([place])    a. s/p succesfful DCCV on 02/13/15  . Pneumonia    (08/15/16)- many years ago  . Sebaceous cyst    on back  of neck, has seen Dr. Ronnald Collum   Allergies  Allergen Reactions  . Ramipril Cough    Social History   Socioeconomic History  . Marital status: Married    Spouse name: Not on file  . Number of children: Not on file  . Years of education: Not on file  . Highest education level: Not on file  Occupational History  . Not on file  Tobacco Use  . Smoking status: Never Smoker  . Smokeless tobacco: Never Used  Substance and Sexual Activity  . Alcohol use: No     Alcohol/week: 0.0 standard drinks  . Drug use: No  . Sexual activity: Not on file  Other Topics Concern  . Not on file  Social History Narrative  . Not on file   Social Determinants of Health   Financial Resource Strain:   . Difficulty of Paying Living Expenses: Not on file  Food Insecurity:   . Worried About Charity fundraiser in the Last Year: Not on file  . Ran Out of Food in the Last Year: Not on file  Transportation Needs:   . Lack of Transportation (Medical): Not on file  . Lack of Transportation (Non-Medical): Not on file  Physical Activity:   . Days of Exercise per Week: Not on file  . Minutes of Exercise per Session: Not on file  Stress:   . Feeling of Stress : Not on file  Social Connections:   . Frequency of Communication with Friends and Family: Not on file  . Frequency of Social Gatherings with Friends and Family: Not on file  . Attends Religious Services: Not on file  . Active Member of Clubs or Organizations: Not on file  . Attends Archivist Meetings: Not on file  . Marital Status: Not on file    Vitals:   04/08/19 0826  BP: 126/80  Pulse: 65  Resp: 16  SpO2: 97%   Body mass index is 19.84 kg/m.  Physical Exam  Nursing note and vitals reviewed. Constitutional: She is oriented to person, place, and time. She appears well-developed and well-nourished. She does not appear ill. No distress.  HENT:  Head: Normocephalic and atraumatic.  Eyes: Conjunctivae are normal.  Cardiovascular: Normal rate and regular rhythm.  Pulses:      Dorsalis pedis pulses are 2+ on the right side and 2+ on the left side.  Respiratory: Effort normal and breath sounds normal. No respiratory distress.  GI: Soft. She exhibits no mass. There is no hepatomegaly. There is no abdominal tenderness. There is no CVA tenderness.  Musculoskeletal:        General: No edema.     Lumbar back: Tenderness present.       Back:     Comments: No significant deformity  appreciated. Mild tenderness upon palpation of paraspinal muscles also with full hip flexion. Pain elicited with movement on exam table during examination. No local edema or erythema appreciated, no suspicious lesions.    Neurological: She is alert and oriented to person, place, and time. She has normal strength. Gait normal.  Reflex Scores:      Patellar reflexes are 2+ on the right side and 2+ on the left side. SLR negative bilateral.  Skin: Skin is warm. No rash noted. No erythema.  Psychiatric: She has a normal mood and affect.  Well groomed, good eye contact.   ASSESSMENT AND PLAN:  Ms. Gianah was seen today for back pain and pyelonephritis.  Diagnoses and all orders  for this visit:  Low back pain radiating to lower extremity We discussed possible etiologies. The likelihood that this is caused by a urine tract infection is low, reassured in this regard. Urine dipstick otherwise negative except for small amount of protein. After discussion of some side effects she agrees with prednisone taper, she cannot swallow pills so liquid was sent to her pharmacy. We discussed some side effects of Zanaflex and tramadol, she can take this medication at bedtime. Continue local heat. Continue daily activities as tolerated avoiding prolong sitting or standing. If not better in 4 to 6 weeks she may need lumbar MRI. Instructed about warning signs.  -     POC Urinalysis Dipstick -     predniSONE 5 MG/5ML solution; 20 ml for 3 days, 10 ml for 3 days,5 ml for 3 days,and 2.5 ml for 3 days. -     tiZANidine (ZANAFLEX) 4 MG tablet; Take 1 tablet (4 mg total) by mouth every 12 (twelve) hours as needed for up to 10 days for muscle spasms. -     traMADol (ULTRAM) 50 MG tablet; Take 0.5-1 tablets (25-50 mg total) by mouth at bedtime as needed for up to 5 days.  PAF (paroxysmal atrial fibrillation) (HCC) Rate and rhythm control. We discussed some side effects of prednisone. Instructed to monitor HR  and BP at home.  Gastroesophageal reflux disease without esophagitis Prednisone could aggravate problem. Continue Prevacid 30 mg daily. Take prednisone with breakfast. Continue GERD precautions.   Return in about 2 weeks (around 04/22/2019) for back pain with PCP.   Takeesha Isley G. Martinique, MD  [facility]. [place] office.

## 2019-04-08 NOTE — Patient Instructions (Signed)
A few things to remember from today's visit:   Low back pain radiating to lower extremity - Plan: POC Urinalysis Dipstick, predniSONE 5 MG/5ML solution, tiZANidine (ZANAFLEX) 4 MG tablet, traMADol (ULTRAM) 50 MG tablet  Left leg pain - Plan: traMADol (ULTRAM) 50 MG tablet  PAF (paroxysmal atrial fibrillation) (H[facility])  Gastroesophageal reflux disease without esophagitis  I do not think symptoms are caused by urinary tract infection. It seems to be musculoskeletal. Prednisone can irritate your stomach and aggravate your acid reflux, so take it with breakfast. Continue monitoring your heart rate to be sure it is still regular. The muscle relaxant and tramadol can cause drowsiness, so you can take it mainly at night. Be careful with falls. Continue local heat. Monitor for rash, fever, chills, or urinary symptoms.  Please be sure medication list is accurate. If a new problem present, please set up appointment sooner than planned today.

## 2019-05-15 DIAGNOSIS — I10 Essential (primary) hypertension: Secondary | ICD-10-CM | POA: Diagnosis not present

## 2019-05-15 DIAGNOSIS — Z20822 Contact with and (suspected) exposure to covid-19: Secondary | ICD-10-CM | POA: Diagnosis not present

## 2019-05-15 DIAGNOSIS — J069 Acute upper respiratory infection, unspecified: Secondary | ICD-10-CM | POA: Diagnosis not present

## 2019-06-01 ENCOUNTER — Telehealth: Payer: Self-pay

## 2019-06-01 DIAGNOSIS — H26493 Other secondary cataract, bilateral: Secondary | ICD-10-CM | POA: Diagnosis not present

## 2019-06-01 NOTE — Telephone Encounter (Signed)
Spoke to patient we received a letter from your pharmacy Xarelto no longer covered with your insurance.She stated it is taken care of now insurance has approved.

## 2019-06-07 DIAGNOSIS — C44311 Basal cell carcinoma of skin of nose: Secondary | ICD-10-CM | POA: Diagnosis not present

## 2019-08-18 NOTE — Progress Notes (Signed)
Cardiology Office Note   Date:  08/23/2019   ID:  Mackenzie [name], [name] Sep 12, 1950, MRN [id number]  PCP:  Laurey Morale, MD  Cardiologist:  Dr. Martinique     History of Present Illness: Mackenzie [name] is a 69 y.o. female seen for follow up Afib. she has a history of HTN, GERD, and esophageal stricture with previous dilations. She has a history of Afib and is on chronic amiodarone.  She presented to [facility] on 02/09/15 with SOB and palpitations. She was found to have new onset atrial fibrillation w/ RVR w/ evidence of CHF and mildly elevated troponin.   A 2D ECHO was ordered which revealed severe LV dysfunction with EF 25-30%. Mild MR and mild LAE. She underwent a nuclear stress test on 02/11/15 which was negative for ischemia. EF 21%. Her severe LV dysfunction was felt to be due to tachycardia and restoration of NSR important. She underwent successful TEE/DCCV on 02/13/15.  On 02/22/15 she was noted to be in recurrent atrial fibrillation w/ CVR with minimal sx aside from fatigue. She was started on amiodarone 200mg  BID and scheduled for follow up with  with plans for DCCV if she remained in afib. On subsequent follow up she had converted to NSR. Echo in Jan. 2017 showed normal LV function with EF 55-60%.   She did undergo esophageal dilitation for stricture on 08/19/16 and in  March 2019 and again in March 2020.   She was seen in July 2020 with multiple complaints.  She reported increased dyspnea on exertion. Had dizziness and has some neck discomfort. Her BP monitor showed an irregular pulse at times and HR will be up to 90-110. BP is labile with occ. BP up to [id number] systolic but at other times it will be down to 88 systolic. Most of the time her BP is normal.  These symptoms were similar to when she had Afib. Labs were OK. Echo was unchanged. PFTs showed moderately reduce diffusion capacity and moderate obstructive airway disease. Event monitor was placed. She did have an episode of AFib in November  lasting 75 seconds.   She wore an event monitor 02/2018 which showed paroxysmal atrial fibrillation w/ controlled rates, afib burden 11%. She was seen by Dr Rayann Heman and initially scheduled for Afib ablation but the patient cancelled the procedure as she felt she was doing better. She did have dilation of an esophageal stricture in March 2020.  She states on follow up today that she is doing OK. Still has Afib about once a month and it may last up to 3 days. She feels pressure in her head and ears and notes BP drops. She feels jittery and fatigued.   Breathing is doing OK. She does get some SOB going up stairs. This has not changed. BP is well controlled.   Past Medical History:  Diagnosis Date  . Chronic systolic CHF (congestive heart failure) ([place])    a. 2D ECHO on 02/10/15 w/ severe LV dysfunction with EF 25-30%. Mild MR and mild LAE. negative nuclear stress test, felt to be due to tachycardia   . Dyspnea    with exertion  . Dysrhythmia    PAF  . Esophageal stricture    a. requiring dilations. crushes pills to take PO  . Full dentures   . GERD (gastroesophageal reflux disease)   . Osteoporosis    sees Dr. Carrolyn Meiers   . PAF (paroxysmal atrial fibrillation) ([place])    a. s/p succesfful DCCV on  02/13/15  . Pneumonia    (08/15/16)- many years ago  . Sebaceous cyst    on back of neck, has seen Dr. Ronnald Collum    Past Surgical History:  Procedure Laterality Date  . BALLOON DILATION N/A 06/22/2018   Procedure: ESOPHAGEAL BALLOON DILATION;  Surgeon: Melida Quitter, MD;  Location: [place];  Service: ENT;  Laterality: N/A;  . BREAST SURGERY Bilateral    Breast Implants  . CARDIOVERSION N/A 02/13/2015   Procedure: CARDIOVERSION;  Surgeon: Pixie Casino, MD;  Location: [place];  Service: Cardiovascular;  Laterality: N/A;  . COLONOSCOPY  09/24/2002  . ESOPHAGEAL DILATION N/A 08/19/2016   Procedure: ESOPHAGEAL DILATION;  Surgeon: Melida Quitter, MD;  Location: [place];   Service: ENT;  Laterality: N/A;  esophagoscopy with balloon dilation  . ESOPHAGOGASTRODUODENOSCOPY  06/01/2003   12/13  . ESOPHAGOGASTRODUODENOSCOPY (EGD) WITH ESOPHAGEAL DILATION    . ESOPHAGOSCOPY WITH DILITATION N/A 06/16/2017   Procedure: ESOPHAGOSCOPY WITH BALLOON DILITATION;  Surgeon: Melida Quitter, MD;  Location: [place];  Service: ENT;  Laterality: N/A;  . RIGID ESOPHAGOSCOPY N/A 01/18/2013   Procedure: RIGID ESOPHAGOSCOPY WITH ESPHAGEAL Balloon DILATION ;  Surgeon: Melida Quitter, MD;  Location: [place];  Service: ENT;  Laterality: N/A;  . RIGID ESOPHAGOSCOPY N/A 06/22/2018   Procedure: RIGID ESOPHAGOSCOPY;  Surgeon: Melida Quitter, MD;  Location: [place];  Service: ENT;  Laterality: N/A;  . TEE WITHOUT CARDIOVERSION N/A 02/13/2015   Procedure: TRANSESOPHAGEAL ECHOCARDIOGRAM (TEE);  Surgeon: Pixie Casino, MD;  [facility];  Service: Cardiovascular;  Laterality: N/A;     Current Outpatient Medications  Medication Sig Dispense Refill  . amiodarone (PACERONE) 200 MG tablet Take 0.5 tablets (100 mg total) by mouth daily. 45 tablet 3  . chlorthalidone (HYGROTON) 25 MG tablet Take 0.5 tablets (12.5 mg total) by mouth daily. 45 tablet 3  . lansoprazole (PREVACID) 30 MG capsule Take 1 capsule (30 mg total) by mouth daily. 90 capsule 3  . losartan (COZAAR) 100 MG tablet Take 1 tablet (100 mg total) by mouth daily. 90 tablet 3  . metoprolol tartrate (LOPRESSOR) 25 MG tablet Take 0.5 tablets (12.5 mg total) by mouth 2 (two) times daily. 90 tablet 3  . Rivaroxaban (XARELTO) 15 MG TABS tablet TAKE 1 TABLET (15 MG TOTAL) BY MOUTH DAILY WITH SUPPER. NOTE DOSE CHANGE 90 tablet 3  . temazepam (RESTORIL) 30 MG capsule Take 1 capsule (30 mg total) by mouth at bedtime as needed. for sleep 90 capsule 1   No current facility-administered medications for this visit.    Allergies:   Ramipril    Social History:  The patient  reports that she  has never smoked. She has never used smokeless tobacco. She reports that she does not drink alcohol or use drugs.   Family History:  The patient's family history includes Aneurysm in her father; Diabetes type II in her sister; Heart attack in her brother and brother; Heart attack (age of onset: 75) in her mother; Hypertension in her sister and sister.    ROS:  Please see the history of present illness.   Otherwise, review of systems are positive for NONE.   All other systems are reviewed and negative.    PHYSICAL EXAM: VS:  BP (!) 144/80 (BP Location: Left Arm, Patient Position: Sitting, Cuff Size: Normal)   Pulse 84   Temp (!) 97.4 F (36.3 C)   Ht 5\' 1"  (1.549 m)   Wt  104 lb (47.2 kg)   BMI 19.65 kg/m  , BMI Body mass index is 19.65 kg/m. GENERAL:  Well appearing thin WF in NAD HEENT:  PERRL, EOMI, sclera are clear. Oropharynx is clear. NECK:  No jugular venous distention, carotid upstroke brisk and symmetric, no bruits, no thyromegaly or adenopathy LUNGS:  Clear to auscultation bilaterally CHEST:  Unremarkable HEART:  IRRR,  PMI not displaced or sustained,S1 and S2 within normal limits, no S3, no S4: no clicks, no rubs, no murmurs ABD:  Soft, nontender. BS +, no masses or bruits. No hepatomegaly, no splenomegaly EXT:  2 + pulses throughout, no edema, no cyanosis no clubbing SKIN:  Warm and dry.  No rashes NEURO:  Alert and oriented x 3. Cranial nerves II through XII intact. PSYCH:  Cognitively intact    EKG:  EKG is  ordered today. Afib rate 84.  Nonspecific ST abnormality. I have personally reviewed and interpreted this study.    Recent Labs: 02/22/2019: ALT 8; BUN 17; Creatinine, Ser 1.20; Hemoglobin 12.3; Platelets 287; Potassium 4.2; Sodium 137; TSH 3.730    Lipid Panel    Component Value Date/Time   CHOL 205 (H) 02/22/2019 1117   TRIG 91 02/22/2019 1117   HDL 77 02/22/2019 1117   CHOLHDL 2.7 02/22/2019 1117   CHOLHDL 3.0 02/10/2015 0126   VLDL 12 02/10/2015 0126    LDLCALC 112 (H) 02/22/2019 1117   LDLDIRECT 121.0 03/14/2010 0931      Wt Readings from Last 3 Encounters:  08/23/19 104 lb (47.2 kg)  02/22/19 105 lb (47.6 kg)  02/22/19 105 lb (47.6 kg)       Other studies Reviewed: Additional studies/ records that were reviewed today include: Echo Review of the above records demonstrates:  Echo 05/10/15: Study Conclusions  - Left ventricle: The cavity size was normal. Wall thickness was normal. Systolic function was normal. The estimated ejection fraction was in the range of 55% to 60%. Wall motion was normal; there were no regional wall motion abnormalities. Doppler parameters are consistent with psuedonormal left ventricular relaxation (grade 2 diastolic dysfunction). The E/A ratio is >1.5. The E/e&' ratio is >20, suggesting markedly elevated LV filling pressure. - Mitral valve: Mildly thickened leaflets . - Left atrium: The atrium was normal in size. - Right ventricle: The cavity size was normal. Wall thickness was normal. Systolic function is reduced. Lateral annulus peak S velocity: 8.79 cm/s. - Atrial septum: No defect or patent foramen ovale was identified. - Tricuspid valve: There was mild regurgitation. - Inferior vena cava: The vessel was normal in size. The respirophasic diameter changes were in the normal range (= 50%), consistent with normal central venous pressure.  Impressions:  - Compared to a prior study in 01/2015, the EF has normalized. There is Grade 2 diastolic dysfunction with elevated LV filling pressure.  Echo 02/26/18: Study Conclusions  - Left ventricle: The cavity size was normal. Wall thickness was   normal. Systolic function was normal. The estimated ejection   fraction was in the range of 55% to 60%. Wall motion was normal;   there were no regional wall motion abnormalities. Doppler   parameters are consistent with abnormal left ventricular   relaxation (grade 2  diastolic dysfunction). The E/e&' ratio is   >15, suggesting elevated LV filling pressure. - Mitral valve: Mildly thickened leaflets . There was trivial   regurgitation. - Left atrium: The atrium was normal in size. - Tricuspid valve: There was mild regurgitation. - Pulmonary arteries: PA peak pressure:  34 mm Hg (S). - Inferior vena cava: The vessel was normal in size. The   respirophasic diameter changes were in the normal range (>= 50%),   consistent with normal central venous pressure.  Impressions:  - Compared to a prior study in 2017, there are no significant   changes.  PFTs: Post-Test Comments: Good patient effort. The results of this test meet ATS standards for acceptability and repeatability. HHN given with 2.5mg  of Albuterol Hgb default value of 13.4 used. The FVC, FEV1, FEV1/FVC ratio and FEF25-75% are reduced indicating airway obstruction. The airway resistance is normal. The SVC is reduced, but the TLC is within normal limits. Following administration of bronchodilators, there is no significant response. The reduced diffusing capacity indicates a moderate loss of functional alveolar capillary surface. However, the diffusing capacity was not corrected for the patient's hemoglobin. Conclusions: Although there is airway obstruction and a diffusion defect suggesting emphysema, the absence of overinflation is inconsistent with that diagnosis. Pulmonary Function Diagnosis: Moderate Obstructive Airways Disease Insignificant response to bronchodilator Moderate Diffusion Defect  ASSESSMENT AND PLAN: 1. Atrial fibrillation-paroxysmal.  s/p TEE/DCCV on 02/13/15, but recurrent.  Subsequently converted to NSR on amiodarone. She is having some breakthrough and event monitor demonstrated a burden of 11%. . Considered for Afib ablation with Dr Rayann Heman but patient declined. -- will update labs today on amiodarone.  -- CHAD-vasc score of 3. Continue  Xarelto  -- we discussed options  again for managing her Afib. One option is to continue what we are doing. Fortunately her Afib is only occurring once a month and rate is controlled. The other option is to increase her amiodarone. She is concerned about the potential toxicity of this and if we were to consider this I would recommend repeating her PFTs. This other option is to consider ablation. She for now would like to continue current therapy. I think if her symptoms increase I would favor ablation.   2. Dyspnea on exertion. Multiple potential etiologies including  recurrent Afib and COPD. PFTs suggest obstructive airway disease. CXR shows some hyperinflation. Moderately reduced diffusion capacity could be related to COPD or amiodarone toxicity. I have no baseline for comparison. Symptoms are fairly minor and have been stable for some time.  3. HTN- BP is controlled per patient on home monitor.     4. GERD- continue PPI  5. Esophageal stricture s/p repeated dilation   Signed, Manika Hast [place], MD  08/23/2019 4:11 PM    [facility] [address], [place], [place]  [address] Phone: [phone]; Fax: [phone]

## 2019-08-23 ENCOUNTER — Encounter: Payer: Self-pay | Admitting: Cardiology

## 2019-08-23 ENCOUNTER — Other Ambulatory Visit: Payer: Self-pay

## 2019-08-23 ENCOUNTER — Ambulatory Visit (INDEPENDENT_AMBULATORY_CARE_PROVIDER_SITE_OTHER): Payer: BC Managed Care – PPO | Admitting: Cardiology

## 2019-08-23 VITALS — BP 144/80 | HR 84 | Temp 97.4°F | Ht 61.0 in | Wt 104.0 lb

## 2019-08-23 DIAGNOSIS — I480 Paroxysmal atrial fibrillation: Secondary | ICD-10-CM | POA: Diagnosis not present

## 2019-08-23 DIAGNOSIS — I10 Essential (primary) hypertension: Secondary | ICD-10-CM | POA: Diagnosis not present

## 2019-08-24 LAB — COMPREHENSIVE METABOLIC PANEL
ALT: 10 IU/L (ref 0–32)
AST: 19 IU/L (ref 0–40)
Albumin/Globulin Ratio: 1.4 (ref 1.2–2.2)
Albumin: 4.3 g/dL (ref 3.8–4.8)
Alkaline Phosphatase: 35 IU/L — ABNORMAL LOW (ref 39–117)
BUN/Creatinine Ratio: 21 (ref 12–28)
BUN: 30 mg/dL — ABNORMAL HIGH (ref 8–27)
Bilirubin Total: 0.2 mg/dL (ref 0.0–1.2)
CO2: 26 mmol/L (ref 20–29)
Calcium: 9.8 mg/dL (ref 8.7–10.3)
Chloride: 103 mmol/L (ref 96–106)
Creatinine, Ser: 1.41 mg/dL — ABNORMAL HIGH (ref 0.57–1.00)
GFR calc Af Amer: 44 mL/min/{1.73_m2} — ABNORMAL LOW (ref >59)
GFR calc non Af Amer: 38 mL/min/{1.73_m2} — ABNORMAL LOW (ref >59)
Globulin, Total: 3.1 g/dL (ref 1.5–4.5)
Glucose: 97 mg/dL (ref 65–99)
Potassium: 4.8 mmol/L (ref 3.5–5.2)
Sodium: 139 mmol/L (ref 134–144)
Total Protein: 7.4 g/dL (ref 6.0–8.5)

## 2019-08-24 LAB — CBC WITH DIFFERENTIAL/PLATELET
Basophils Absolute: 0.1 10*3/uL (ref 0.0–0.2)
Basos: 1 %
EOS (ABSOLUTE): 0.1 10*3/uL (ref 0.0–0.4)
Eos: 1 %
Hematocrit: 43.1 % (ref 34.0–46.6)
Hemoglobin: 14 g/dL (ref 11.1–15.9)
Immature Grans (Abs): 0 10*3/uL (ref 0.0–0.1)
Immature Granulocytes: 0 %
Lymphocytes Absolute: 1.9 10*3/uL (ref 0.7–3.1)
Lymphs: 26 %
MCH: 29.5 pg (ref 26.6–33.0)
MCHC: 32.5 g/dL (ref 31.5–35.7)
MCV: 91 fL (ref 79–97)
Monocytes Absolute: 0.6 10*3/uL (ref 0.1–0.9)
Monocytes: 8 %
Neutrophils Absolute: 4.8 10*3/uL (ref 1.4–7.0)
Neutrophils: 64 %
Platelets: 327 10*3/uL (ref 150–450)
RBC: 4.74 x10E6/uL (ref 3.77–5.28)
RDW: 12.3 % (ref 11.7–15.4)
WBC: 7.4 10*3/uL (ref 3.4–10.8)

## 2019-08-24 LAB — TSH: TSH: 3.65 u[IU]/mL (ref 0.450–4.500)

## 2019-08-30 ENCOUNTER — Telehealth: Payer: Self-pay

## 2019-08-30 NOTE — Telephone Encounter (Signed)
Following message received from covermymeds: Mackenzie [name] Key: [place] - PA Case ID: [id number] - Rx #[id number] Outcome  This request has been approved using information available on the patient's profile. Case Id: [id number]; Status:Approved; Review Type: Prior Auth;Coverage Start Date:07/31/2019;Coverage End Date:08/29/2020 Drug Xarelto 15MG  tablets  [employer] Electronic PA Form (2017 NCPDP)   I have notified [facility] of this approval.

## 2019-08-30 NOTE — Telephone Encounter (Signed)
I started a Xarelto PA through covermymed. Key: [id number]

## 2019-09-02 DIAGNOSIS — H0014 Chalazion left upper eyelid: Secondary | ICD-10-CM | POA: Diagnosis not present

## 2019-09-02 DIAGNOSIS — H01004 Unspecified blepharitis left upper eyelid: Secondary | ICD-10-CM | POA: Diagnosis not present

## 2019-11-30 ENCOUNTER — Other Ambulatory Visit: Payer: Self-pay | Admitting: Cardiology

## 2020-02-22 NOTE — Progress Notes (Signed)
Cardiology Office Note   Date:  02/24/2020   ID:  Mackenzie [name], Mackenzie [name] 24-Jul-1950, MRN [id number]  PCP:  Laurey Morale, MD  Cardiologist:  Dr. Martinique     History of Present Illness: Mackenzie [name] is a 70 y.o. female seen for follow up Afib. she has a history of HTN, GERD, and esophageal stricture with previous dilations. She has a history of Afib and is on chronic amiodarone.  She presented to [facility] on 02/09/15 with SOB and palpitations. She was found to have new onset atrial fibrillation w/ RVR w/ evidence of CHF and mildly elevated troponin.   A 2D ECHO was ordered which revealed severe LV dysfunction with EF 25-30%. Mild MR and mild LAE. She underwent a nuclear stress test on 02/11/15 which was negative for ischemia. EF 21%. Her severe LV dysfunction was felt to be due to tachycardia and restoration of NSR important. She underwent successful TEE/DCCV on 02/13/15.  On 02/22/15 she was noted to be in recurrent atrial fibrillation w/ CVR with minimal sx aside from fatigue. She was started on amiodarone 200mg  BID and scheduled for follow up with  with plans for DCCV if she remained in afib. On subsequent follow up she had converted to NSR. Echo in Jan. 2017 showed normal LV function with EF 55-60%.   She did undergo esophageal dilitation for stricture on 08/19/16 and in  March 2019 and again in March 2020.   She was seen in July 2020 with multiple complaints.  She reported increased dyspnea on exertion. Had dizziness and has some neck discomfort. Her BP monitor showed an irregular pulse at times and HR will be up to 90-110. BP is labile with occ. BP up to 664-403 systolic but at other times it will be down to 88 systolic. Most of the time her BP is normal.  These symptoms were similar to when she had Afib. Labs were OK. Echo was unchanged. PFTs showed moderately reduce diffusion capacity and moderate obstructive airway disease. Event monitor was placed. She did have an episode of AFib in November  lasting 75 seconds.   She wore an event monitor 02/2018 which showed paroxysmal atrial fibrillation w/ controlled rates, afib burden 11%. She was seen by Dr Rayann Heman and initially scheduled for Afib ablation but the patient cancelled the procedure as she felt she was doing better. She did have dilation of an esophageal stricture in March 2020.  She states on follow up today that she is doing OK. Still has Afib about once a month and it may last up to 3 days. She had a particularly bad episode this past Sunday night with associated nausea, indigestion and palpitations. Felt very weak. Went to bed and felt better the next day.   Breathing is doing OK. She does get some SOB going up stairs. This has not changed. BP is well controlled.   Past Medical History:  Diagnosis Date   Chronic systolic CHF (congestive heart failure) ([place])    a. 2D ECHO on 02/10/15 w/ severe LV dysfunction with EF 25-30%. Mild MR and mild LAE. negative nuclear stress test, felt to be due to tachycardia    Dyspnea    with exertion   Dysrhythmia    PAF   Esophageal stricture    a. requiring dilations. crushes pills to take PO   Full dentures    GERD (gastroesophageal reflux disease)    Osteoporosis    sees Dr. Carrolyn Meiers    PAF (paroxysmal  atrial fibrillation) ([place])    a. s/p succesfful DCCV on 02/13/15   Pneumonia    (08/15/16)- many years ago   Sebaceous cyst    on back of neck, has seen Dr. Ronnald Collum    Past Surgical History:  Procedure Laterality Date   BALLOON DILATION N/A 06/22/2018   Procedure: [place];  Surgeon: Melida Quitter, MD;  Location: [place];  Service: ENT;  Laterality: N/A;   BREAST SURGERY Bilateral    Breast Implants   CARDIOVERSION N/A 02/13/2015   Procedure: CARDIOVERSION;  Surgeon: Pixie Casino, MD;  Location: [place];  Service: Cardiovascular;  Laterality: N/A;   COLONOSCOPY  09/24/2002   ESOPHAGEAL DILATION N/A 08/19/2016    Procedure: ESOPHAGEAL DILATION;  Surgeon: Melida Quitter, MD;  Location: [place];  Service: ENT;  Laterality: N/A;  esophagoscopy with balloon dilation   ESOPHAGOGASTRODUODENOSCOPY  06/01/2003   12/13   ESOPHAGOGASTRODUODENOSCOPY (EGD) WITH ESOPHAGEAL DILATION     ESOPHAGOSCOPY WITH DILITATION N/A 06/16/2017   Procedure: ESOPHAGOSCOPY WITH BALLOON DILITATION;  Surgeon: Melida Quitter, MD;  Location: [place];  Service: ENT;  Laterality: N/A;   RIGID ESOPHAGOSCOPY N/A 01/18/2013   Procedure: RIGID ESOPHAGOSCOPY WITH ESPHAGEAL Balloon DILATION ;  Surgeon: Melida Quitter, MD;  Location: [place];  Service: ENT;  Laterality: N/A;   RIGID ESOPHAGOSCOPY N/A 06/22/2018   Procedure: RIGID ESOPHAGOSCOPY;  Surgeon: Melida Quitter, MD;  Location: [place];  Service: ENT;  Laterality: N/A;   TEE WITHOUT CARDIOVERSION N/A 02/13/2015   Procedure: TRANSESOPHAGEAL ECHOCARDIOGRAM (TEE);  Surgeon: Pixie Casino, MD;  Location: [facility] ENDOSCOPY;  Service: Cardiovascular;  Laterality: N/A;     Current Outpatient Medications  Medication Sig Dispense Refill   amiodarone (PACERONE) 200 MG tablet Take 0.5 tablets (100 mg total) by mouth daily. 45 tablet 3   chlorthalidone (HYGROTON) 25 MG tablet TAKE 1/2 TABLET BY MOUTH EVERY DAY 45 tablet 3   lansoprazole (PREVACID) 30 MG capsule Take 1 capsule (30 mg total) by mouth daily. 90 capsule 3   losartan (COZAAR) 100 MG tablet TAKE 1 TABLET BY MOUTH EVERY DAY 90 tablet 3   metoprolol tartrate (LOPRESSOR) 25 MG tablet Take 0.5 tablets (12.5 mg total) by mouth 2 (two) times daily. 90 tablet 3   Rivaroxaban (XARELTO) 15 MG TABS tablet TAKE 1 TABLET (15 MG TOTAL) BY MOUTH DAILY WITH SUPPER. NOTE DOSE CHANGE 90 tablet 3   temazepam (RESTORIL) 30 MG capsule Take 1 capsule (30 mg total) by mouth at bedtime as needed. for sleep 90 capsule 1   No current facility-administered medications for this visit.    Allergies:   Ramipril     Social History:  The patient  reports that she has never smoked. She has never used smokeless tobacco. She reports that she does not drink alcohol and does not use drugs.   Family History:  The patient's family history includes Aneurysm in her father; Diabetes type II in her sister; Heart attack in her brother and brother; Heart attack (age of onset: 61) in her mother; Hypertension in her sister and sister.    ROS:  Please see the history of present illness.   Otherwise, review of systems are positive for NONE.   All other systems are reviewed and negative.    PHYSICAL EXAM: VS:  BP (!) 144/80 (BP Location: Left Arm, Patient Position: Sitting, Cuff Size: Normal)    Pulse 64    Ht 5\' 1"  (1.549 m)  Wt 106 lb (48.1 kg)    BMI 20.03 kg/m  , BMI Body mass index is 20.03 kg/m. GENERAL:  Well appearing thin WF in NAD HEENT:  PERRL, EOMI, sclera are clear. Oropharynx is clear. NECK:  No jugular venous distention, carotid upstroke brisk and symmetric, no bruits, no thyromegaly or adenopathy LUNGS:  Clear to auscultation bilaterally CHEST:  Unremarkable HEART:  RRR,  PMI not displaced or sustained,S1 and S2 within normal limits, no S3, no S4: no clicks, no rubs, no murmurs ABD:  Soft, nontender. BS +, no masses or bruits. No hepatomegaly, no splenomegaly EXT:  2 + pulses throughout, no edema, no cyanosis no clubbing SKIN:  Warm and dry.  No rashes NEURO:  Alert and oriented x 3. Cranial nerves II through XII intact. PSYCH:  Cognitively intact    EKG:  EKG is  Not ordered today.     Recent Labs: 08/23/2019: ALT 10; BUN 30; Creatinine, Ser 1.41; Hemoglobin 14.0; Platelets 327; Potassium 4.8; Sodium 139; TSH 3.650    Lipid Panel    Component Value Date/Time   CHOL 205 (H) 02/22/2019 1117   TRIG 91 02/22/2019 1117   HDL 77 02/22/2019 1117   CHOLHDL 2.7 02/22/2019 1117   CHOLHDL 3.0 02/10/2015 0126   VLDL 12 02/10/2015 0126   LDLCALC 112 (H) 02/22/2019 1117   LDLDIRECT 121.0  03/14/2010 0931      Wt Readings from Last 3 Encounters:  02/24/20 106 lb (48.1 kg)  08/23/19 104 lb (47.2 kg)  02/22/19 105 lb (47.6 kg)       Other studies Reviewed: Additional studies/ records that were reviewed today include: Echo Review of the above records demonstrates:  Echo 05/10/15: Study Conclusions  - Left ventricle: The cavity size was normal. Wall thickness was normal. Systolic function was normal. The estimated ejection fraction was in the range of 55% to 60%. Wall motion was normal; there were no regional wall motion abnormalities. Doppler parameters are consistent with psuedonormal left ventricular relaxation (grade 2 diastolic dysfunction). The E/A ratio is >1.5. The E/e&' ratio is >20, suggesting markedly elevated LV filling pressure. - Mitral valve: Mildly thickened leaflets . - Left atrium: The atrium was normal in size. - Right ventricle: The cavity size was normal. Wall thickness was normal. Systolic function is reduced. Lateral annulus peak S velocity: 8.79 cm/s. - Atrial septum: No defect or patent foramen ovale was identified. - Tricuspid valve: There was mild regurgitation. - Inferior vena cava: The vessel was normal in size. The respirophasic diameter changes were in the normal range (= 50%), consistent with normal central venous pressure.  Impressions:  - Compared to a prior study in 01/2015, the EF has normalized. There is Grade 2 diastolic dysfunction with elevated LV filling pressure.  Echo 02/26/18: Study Conclusions  - Left ventricle: The cavity size was normal. Wall thickness was   normal. Systolic function was normal. The estimated ejection   fraction was in the range of 55% to 60%. Wall motion was normal;   there were no regional wall motion abnormalities. Doppler   parameters are consistent with abnormal left ventricular   relaxation (grade 2 diastolic dysfunction). The E/e&' ratio is   >15, suggesting  elevated LV filling pressure. - Mitral valve: Mildly thickened leaflets . There was trivial   regurgitation. - Left atrium: The atrium was normal in size. - Tricuspid valve: There was mild regurgitation. - Pulmonary arteries: PA peak pressure: 34 mm Hg (S). - Inferior vena cava: The vessel was  normal in size. The   respirophasic diameter changes were in the normal range (>= 50%),   consistent with normal central venous pressure.  Impressions:  - Compared to a prior study in 2017, there are no significant   changes.  PFTs: Post-Test Comments: Good patient effort. The results of this test meet ATS standards for acceptability and repeatability. HHN given with 2.5mg  of Albuterol Hgb default value of 13.4 used. The FVC, FEV1, FEV1/FVC ratio and FEF25-75% are reduced indicating airway obstruction. The airway resistance is normal. The SVC is reduced, but the TLC is within normal limits. Following administration of bronchodilators, there is no significant response. The reduced diffusing capacity indicates a moderate loss of functional alveolar capillary surface. However, the diffusing capacity was not corrected for the patient's hemoglobin. Conclusions: Although there is airway obstruction and a diffusion defect suggesting emphysema, the absence of overinflation is inconsistent with that diagnosis. Pulmonary Function Diagnosis: Moderate Obstructive Airways Disease Insignificant response to bronchodilator Moderate Diffusion Defect  ASSESSMENT AND PLAN: 1. Atrial fibrillation-paroxysmal.  s/p TEE/DCCV on 02/13/15, but recurrent.  Subsequently converted to NSR on amiodarone. She is having some breakthrough and event monitor previously demonstrated a burden of 11%. . Considered for Afib ablation with Dr Rayann Heman but patient declined at that time. Labs in May were OK. -- CHAD-vasc score of 3. Continue  Xarelto  -- we discussed options again for managing her Afib.  The other option is to increase  her amiodarone but I would worry about the potential toxicity of this with moderately reduced diffusion capacity on prior PFTs.  This other option is to consider ablation. She for now would like to continue current therapy. She told me if she had another episode like Sunday she would reconsider ablation.  2. Dyspnea on exertion. Multiple potential etiologies including  recurrent Afib and COPD. PFTs suggest obstructive airway disease. CXR shows some hyperinflation. Moderately reduced diffusion capacity could be related to COPD or amiodarone toxicity. I have no baseline for comparison. Symptoms are fairly minor and have been stable for some time.  3. HTN- BP is controlled per patient on home monitor.     4. GERD- continue PPI  5. Esophageal stricture s/p repeated dilation  Follow up in 6 months.   Signed, Leotis Isham Martinique, MD  02/24/2020 9:10 AM    [address], [place], [place]  [address] Phone: [phone]; Fax: [phone]

## 2020-02-24 ENCOUNTER — Ambulatory Visit (INDEPENDENT_AMBULATORY_CARE_PROVIDER_SITE_OTHER): Payer: BC Managed Care – PPO | Admitting: Cardiology

## 2020-02-24 ENCOUNTER — Other Ambulatory Visit: Payer: Self-pay

## 2020-02-24 ENCOUNTER — Encounter: Payer: Self-pay | Admitting: Cardiology

## 2020-02-24 VITALS — BP 144/80 | HR 64 | Ht 61.0 in | Wt 106.0 lb

## 2020-02-24 DIAGNOSIS — I10 Essential (primary) hypertension: Secondary | ICD-10-CM | POA: Diagnosis not present

## 2020-02-24 DIAGNOSIS — Z7901 Long term (current) use of anticoagulants: Secondary | ICD-10-CM

## 2020-02-24 DIAGNOSIS — R0600 Dyspnea, unspecified: Secondary | ICD-10-CM | POA: Diagnosis not present

## 2020-02-24 DIAGNOSIS — I480 Paroxysmal atrial fibrillation: Secondary | ICD-10-CM

## 2020-02-24 DIAGNOSIS — R0609 Other forms of dyspnea: Secondary | ICD-10-CM

## 2020-02-24 MED ORDER — AMIODARONE HCL 200 MG PO TABS: 100.0000 mg | ORAL_TABLET | Freq: Every day | ORAL | 3 refills | Status: DC

## 2020-02-24 MED ORDER — METOPROLOL TARTRATE 25 MG PO TABS: 12.5000 mg | ORAL_TABLET | Freq: Two times a day (BID) | ORAL | 3 refills | Status: DC

## 2020-02-24 MED ORDER — RIVAROXABAN 15 MG PO TABS: ORAL_TABLET | ORAL | 3 refills | Status: DC

## 2020-02-25 ENCOUNTER — Other Ambulatory Visit: Payer: Self-pay | Admitting: Family Medicine

## 2020-02-25 NOTE — Telephone Encounter (Signed)
Last OV 02/22/19 Last fill 02/22/19  #90/3 Patient need a office visit before any refills.

## 2020-03-23 ENCOUNTER — Other Ambulatory Visit: Payer: Self-pay | Admitting: Family Medicine

## 2020-04-18 ENCOUNTER — Other Ambulatory Visit: Payer: Self-pay | Admitting: Family Medicine

## 2020-04-25 ENCOUNTER — Encounter: Payer: Self-pay | Admitting: Family Medicine

## 2020-04-25 ENCOUNTER — Other Ambulatory Visit: Payer: Self-pay

## 2020-04-25 ENCOUNTER — Ambulatory Visit: Payer: BC Managed Care – PPO | Admitting: Family Medicine

## 2020-04-25 VITALS — BP 180/90 | HR 57 | Temp 98.5°F | Ht 61.0 in | Wt 107.8 lb

## 2020-04-25 DIAGNOSIS — K219 Gastro-esophageal reflux disease without esophagitis: Secondary | ICD-10-CM

## 2020-04-25 MED ORDER — LANSOPRAZOLE 30 MG PO CPDR: DELAYED_RELEASE_CAPSULE | ORAL | 3 refills | Status: DC

## 2020-04-25 NOTE — Progress Notes (Signed)
   Subjective:    Patient ID: Mackenzie [name], female    DOB: April 14, 1951, 70 y.o.   MRN: [id number]  HPI Here to follow up on GERD. She feels well and needs refills on Prevacid. She sees Mackenzie [name] every 6 months. Her atrial fibrillation and HTN are stable.    Review of Systems  Constitutional: Negative.   Respiratory: Negative.   Cardiovascular: Negative.   Gastrointestinal: Negative.        Objective:   Physical Exam Constitutional:      Appearance: Normal appearance.  Cardiovascular:     Rate and Rhythm: Normal rate and regular rhythm.     Pulses: Normal pulses.     Heart sounds: Normal heart sounds.  Pulmonary:     Effort: Pulmonary effort is normal.     Breath sounds: Normal breath sounds.  Neurological:     Mental Status: She is alert.           Assessment & Plan:  GERD is stable. The Prevacid is refilled.  Mackenzie Penna, MD

## 2020-05-10 DIAGNOSIS — H26493 Other secondary cataract, bilateral: Secondary | ICD-10-CM | POA: Diagnosis not present

## 2020-05-10 DIAGNOSIS — H52203 Unspecified astigmatism, bilateral: Secondary | ICD-10-CM | POA: Diagnosis not present

## 2020-06-08 DIAGNOSIS — H26491 Other secondary cataract, right eye: Secondary | ICD-10-CM | POA: Diagnosis not present

## 2020-06-22 DIAGNOSIS — H26492 Other secondary cataract, left eye: Secondary | ICD-10-CM | POA: Diagnosis not present

## 2020-08-18 NOTE — Progress Notes (Deleted)
Cardiology Office Note   Date:  08/18/2020   ID:  Mackenzie, [name] 09/21/1950, MRN [id number]  PCP:  Laurey Morale, MD  Cardiologist:  Dr. Martinique     History of Present Illness: Mackenzie [name] is a 70 y.o. female seen for follow up Afib. she has a history of HTN, GERD, and esophageal stricture with previous dilations. She has a history of Afib and is on chronic amiodarone.  She presented to [facility] on 02/09/15 with SOB and palpitations. She was found to have new onset atrial fibrillation w/ RVR w/ evidence of CHF and mildly elevated troponin.   A 2D ECHO was ordered which revealed severe LV dysfunction with EF 25-30%. Mild MR and mild LAE. She underwent a nuclear stress test on 02/11/15 which was negative for ischemia. EF 21%. Her severe LV dysfunction was felt to be due to tachycardia and restoration of NSR important. She underwent successful TEE/DCCV on 02/13/15.  On 02/22/15 she was noted to be in recurrent atrial fibrillation w/ CVR with minimal sx aside from fatigue. She was started on amiodarone 200mg  BID and scheduled for follow up with  with plans for DCCV if she remained in afib. On subsequent follow up she had converted to NSR. Echo in Jan. 2017 showed normal LV function with EF 55-60%.   She did undergo esophageal dilitation for stricture on 08/19/16 and in  March 2019 and again in March 2020.   She was seen in July 2020 with multiple complaints.  She reported increased dyspnea on exertion. Had dizziness and has some neck discomfort. Her BP monitor showed an irregular pulse at times and HR will be up to 90-110. BP is labile with occ. BP up to 341-962 systolic but at other times it will be down to 88 systolic. Most of the time her BP is normal.  These symptoms were similar to when she had Afib. Labs were OK. Echo was unchanged. PFTs showed moderately reduce diffusion capacity and moderate obstructive airway disease. Event monitor was placed. She did have an episode of AFib in November  lasting 75 seconds.   She wore an event monitor 02/2018 which showed paroxysmal atrial fibrillation w/ controlled rates, afib burden 11%. She was seen by Dr Rayann Heman and initially scheduled for Afib ablation but the patient cancelled the procedure as she felt she was doing better. She did have dilation of an esophageal stricture in March 2020.  She states on follow up today that she is doing OK. Still has Afib about once a month and it may last up to 3 days. She had a particularly bad episode this past Sunday night with associated nausea, indigestion and palpitations. Felt very weak. Went to bed and felt better the next day.   Breathing is doing OK. She does get some SOB going up stairs. This has not changed. BP is well controlled.   Past Medical History:  Diagnosis Date  . Chronic systolic CHF (congestive heart failure) ([place])    a. 2D ECHO on 02/10/15 w/ severe LV dysfunction with EF 25-30%. Mild MR and mild LAE. negative nuclear stress test, felt to be due to tachycardia   . Dyspnea    with exertion  . Dysrhythmia    PAF  . Esophageal stricture    a. requiring dilations. crushes pills to take PO  . Full dentures   . GERD (gastroesophageal reflux disease)   . Osteoporosis    sees Dr. Carrolyn Meiers   . PAF (paroxysmal  atrial fibrillation) ([place])    a. s/p succesfful DCCV on 02/13/15  . Pneumonia    (08/15/16)- many years ago  . Sebaceous cyst    on back of neck, has seen Dr. Ronnald Collum    Past Surgical History:  Procedure Laterality Date  . BALLOON DILATION N/A 06/22/2018   Procedure: ESOPHAGEAL BALLOON DILATION;  Surgeon: Melida Quitter, MD;  Location: [place];  Service: ENT;  Laterality: N/A;  . BREAST SURGERY Bilateral    Breast Implants  . CARDIOVERSION N/A 02/13/2015   Procedure: CARDIOVERSION;  Surgeon: Pixie Casino, MD;  Location: [place];  Service: Cardiovascular;  Laterality: N/A;  . COLONOSCOPY  09/24/2002  . ESOPHAGEAL DILATION N/A 08/19/2016    Procedure: ESOPHAGEAL DILATION;  Surgeon: Melida Quitter, MD;  Location: [place];  Service: ENT;  Laterality: N/A;  esophagoscopy with balloon dilation  . ESOPHAGOGASTRODUODENOSCOPY  06/01/2003   12/13  . ESOPHAGOGASTRODUODENOSCOPY (EGD) WITH ESOPHAGEAL DILATION    . ESOPHAGOSCOPY WITH DILITATION N/A 06/16/2017   Procedure: ESOPHAGOSCOPY WITH BALLOON DILITATION;  Surgeon: Melida Quitter, MD;  Location: [place];  Service: ENT;  Laterality: N/A;  . RIGID ESOPHAGOSCOPY N/A 01/18/2013   Procedure: RIGID ESOPHAGOSCOPY WITH ESPHAGEAL Balloon DILATION ;  Surgeon: Melida Quitter, MD;  Location: [place];  Service: ENT;  Laterality: N/A;  . RIGID ESOPHAGOSCOPY N/A 06/22/2018   Procedure: RIGID ESOPHAGOSCOPY;  Surgeon: Melida Quitter, MD;  Location: [place];  Service: ENT;  Laterality: N/A;  . TEE WITHOUT CARDIOVERSION N/A 02/13/2015   Procedure: TRANSESOPHAGEAL ECHOCARDIOGRAM (TEE);  Surgeon: Pixie Casino, MD;  Location: [facility] ENDOSCOPY;  Service: Cardiovascular;  Laterality: N/A;     Current Outpatient Medications  Medication Sig Dispense Refill  . amiodarone (PACERONE) 200 MG tablet Take 0.5 tablets (100 mg total) by mouth daily. 45 tablet 3  . chlorthalidone (HYGROTON) 25 MG tablet TAKE 1/2 TABLET BY MOUTH EVERY DAY 45 tablet 3  . lansoprazole (PREVACID) 30 MG capsule TAKE 1 CAPSULE BY MOUTH EVERY DAY 90 capsule 3  . losartan (COZAAR) 100 MG tablet TAKE 1 TABLET BY MOUTH EVERY DAY 90 tablet 3  . metoprolol tartrate (LOPRESSOR) 25 MG tablet Take 0.5 tablets (12.5 mg total) by mouth 2 (two) times daily. 90 tablet 3  . Rivaroxaban (XARELTO) 15 MG TABS tablet TAKE 1 TABLET (15 MG TOTAL) BY MOUTH DAILY WITH SUPPER. NOTE DOSE CHANGE 90 tablet 3  . temazepam (RESTORIL) 30 MG capsule Take 1 capsule (30 mg total) by mouth at bedtime as needed. for sleep 90 capsule 1   No current facility-administered medications for this visit.    Allergies:   Ramipril     Social History:  The patient  reports that she has never smoked. She has never used smokeless tobacco. She reports that she does not drink alcohol and does not use drugs.   Family History:  The patient's family history includes Aneurysm in her father; Diabetes type II in her sister; Heart attack in her brother and brother; Heart attack (age of onset: 83) in her mother; Hypertension in her sister and sister.    ROS:  Please see the history of present illness.   Otherwise, review of systems are positive for NONE.   All other systems are reviewed and negative.    PHYSICAL EXAM: VS:  There were no vitals taken for this visit. , BMI There is no height or weight on file to calculate BMI. GENERAL:  Well appearing thin WF in  NAD HEENT:  PERRL, EOMI, sclera are clear. Oropharynx is clear. NECK:  No jugular venous distention, carotid upstroke brisk and symmetric, no bruits, no thyromegaly or adenopathy LUNGS:  Clear to auscultation bilaterally CHEST:  Unremarkable HEART:  RRR,  PMI not displaced or sustained,S1 and S2 within normal limits, no S3, no S4: no clicks, no rubs, no murmurs ABD:  Soft, nontender. BS +, no masses or bruits. No hepatomegaly, no splenomegaly EXT:  2 + pulses throughout, no edema, no cyanosis no clubbing SKIN:  Warm and dry.  No rashes NEURO:  Alert and oriented x 3. Cranial nerves II through XII intact. PSYCH:  Cognitively intact    EKG:  EKG is  Not ordered today.     Recent Labs: 08/23/2019: ALT 10; BUN 30; Creatinine, Ser 1.41; Hemoglobin 14.0; Platelets 327; Potassium 4.8; Sodium 139; TSH 3.650    Lipid Panel    Component Value Date/Time   CHOL 205 (H) 02/22/2019 1117   TRIG 91 02/22/2019 1117   HDL 77 02/22/2019 1117   CHOLHDL 2.7 02/22/2019 1117   CHOLHDL 3.0 02/10/2015 0126   VLDL 12 02/10/2015 0126   LDLCALC 112 (H) 02/22/2019 1117   LDLDIRECT 121.0 03/14/2010 0931      Wt Readings from Last 3 Encounters:  04/25/20 107 lb 12.8 oz (48.9 kg)   02/24/20 106 lb (48.1 kg)  08/23/19 104 lb (47.2 kg)       Other studies Reviewed: Additional studies/ records that were reviewed today include: Echo Review of the above records demonstrates:  Echo 05/10/15: Study Conclusions  - Left ventricle: The cavity size was normal. Wall thickness was normal. Systolic function was normal. The estimated ejection fraction was in the range of 55% to 60%. Wall motion was normal; there were no regional wall motion abnormalities. Doppler parameters are consistent with psuedonormal left ventricular relaxation (grade 2 diastolic dysfunction). The E/A ratio is >1.5. The E/e&' ratio is >20, suggesting markedly elevated LV filling pressure. - Mitral valve: Mildly thickened leaflets . - Left atrium: The atrium was normal in size. - Right ventricle: The cavity size was normal. Wall thickness was normal. Systolic function is reduced. Lateral annulus peak S velocity: 8.79 cm/s. - Atrial septum: No defect or patent foramen ovale was identified. - Tricuspid valve: There was mild regurgitation. - Inferior vena cava: The vessel was normal in size. The respirophasic diameter changes were in the normal range (= 50%), consistent with normal central venous pressure.  Impressions:  - Compared to a prior study in 01/2015, the EF has normalized. There is Grade 2 diastolic dysfunction with elevated LV filling pressure.  Echo 02/26/18: Study Conclusions  - Left ventricle: The cavity size was normal. Wall thickness was   normal. Systolic function was normal. The estimated ejection   fraction was in the range of 55% to 60%. Wall motion was normal;   there were no regional wall motion abnormalities. Doppler   parameters are consistent with abnormal left ventricular   relaxation (grade 2 diastolic dysfunction). The E/e&' ratio is   >15, suggesting elevated LV filling pressure. - Mitral valve: Mildly thickened leaflets . There was  trivial   regurgitation. - Left atrium: The atrium was normal in size. - Tricuspid valve: There was mild regurgitation. - Pulmonary arteries: PA peak pressure: 34 mm Hg (S). - Inferior vena cava: The vessel was normal in size. The   respirophasic diameter changes were in the normal range (>= 50%),   consistent with normal central venous pressure.  Impressions:  - Compared to a prior study in 2017, there are no significant   changes.  PFTs: Post-Test Comments: Good patient effort. The results of this test meet ATS standards for acceptability and repeatability. HHN given with 2.5mg  of Albuterol Hgb default value of 13.4 used. The FVC, FEV1, FEV1/FVC ratio and FEF25-75% are reduced indicating airway obstruction. The airway resistance is normal. The SVC is reduced, but the TLC is within normal limits. Following administration of bronchodilators, there is no significant response. The reduced diffusing capacity indicates a moderate loss of functional alveolar capillary surface. However, the diffusing capacity was not corrected for the patient's hemoglobin. Conclusions: Although there is airway obstruction and a diffusion defect suggesting emphysema, the absence of overinflation is inconsistent with that diagnosis. Pulmonary Function Diagnosis: Moderate Obstructive Airways Disease Insignificant response to bronchodilator Moderate Diffusion Defect  ASSESSMENT AND PLAN: 1. Atrial fibrillation-paroxysmal.  s/p TEE/DCCV on 02/13/15, but recurrent.  Subsequently converted to NSR on amiodarone. She is having some breakthrough and event monitor previously demonstrated a burden of 11%. . Considered for Afib ablation with Dr Rayann Heman but patient declined at that time. Labs in May were OK. -- CHAD-vasc score of 3. Continue  Xarelto  -- we discussed options again for managing her Afib.  The other option is to increase her amiodarone but I would worry about the potential toxicity of this with moderately  reduced diffusion capacity on prior PFTs.  This other option is to consider ablation. She for now would like to continue current therapy. She told me if she had another episode like Sunday she would reconsider ablation.  2. Dyspnea on exertion. Multiple potential etiologies including  recurrent Afib and COPD. PFTs suggest obstructive airway disease. CXR shows some hyperinflation. Moderately reduced diffusion capacity could be related to COPD or amiodarone toxicity. I have no baseline for comparison. Symptoms are fairly minor and have been stable for some time.  3. HTN- BP is controlled per patient on home monitor.     4. GERD- continue PPI  5. Esophageal stricture s/p repeated dilation  Follow up in 6 months.   Signed, Tylena Prisk Martinique, MD  08/18/2020 2:44 PM    [facility] [address], [place], [place]  [address] Phone: [phone]; Fax: [phone]

## 2020-08-24 ENCOUNTER — Ambulatory Visit: Payer: BC Managed Care – PPO | Admitting: Cardiology

## 2020-09-13 ENCOUNTER — Ambulatory Visit (INDEPENDENT_AMBULATORY_CARE_PROVIDER_SITE_OTHER): Payer: BC Managed Care – PPO | Admitting: Medical

## 2020-09-13 ENCOUNTER — Other Ambulatory Visit: Payer: Self-pay

## 2020-09-13 ENCOUNTER — Encounter: Payer: Self-pay | Admitting: Medical

## 2020-09-13 VITALS — BP 142/76 | HR 57 | Ht 61.0 in | Wt 105.4 lb

## 2020-09-13 DIAGNOSIS — I10 Essential (primary) hypertension: Secondary | ICD-10-CM | POA: Diagnosis not present

## 2020-09-13 DIAGNOSIS — R0683 Snoring: Secondary | ICD-10-CM

## 2020-09-13 DIAGNOSIS — I480 Paroxysmal atrial fibrillation: Secondary | ICD-10-CM | POA: Diagnosis not present

## 2020-09-13 DIAGNOSIS — I5022 Chronic systolic (congestive) heart failure: Secondary | ICD-10-CM

## 2020-09-13 DIAGNOSIS — R0989 Other specified symptoms and signs involving the circulatory and respiratory systems: Secondary | ICD-10-CM | POA: Diagnosis not present

## 2020-09-13 DIAGNOSIS — R400 Somnolence: Secondary | ICD-10-CM

## 2020-09-13 MED ORDER — LOSARTAN POTASSIUM 50 MG PO TABS: 50.0000 mg | ORAL_TABLET | Freq: Every day | ORAL | 3 refills | Status: DC

## 2020-09-13 NOTE — Progress Notes (Signed)
Cardiology Office Note   Date:  09/13/2020   ID:  Mackenzie [name] 12-27-1950, MRN [id number]  PCP:  Laurey Morale, MD  Cardiologist:  Peter Martinique, MD EP: Thompson Grayer, MD  Chief Complaint  Patient presents with  . Follow-up    Afib      History of Present Illness: Mackenzie [name] is a 70 y.o. 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  She was last evaluated by cardiology outpatient visit with Dr. Martinique 02/24/2020 at which time she was doing okay from a cardiology standpoint.  She reported episodes of atrial fibrillation approximately once a month which would last for up to 3 days.  She had a particularly bad episode just prior to her visit with associated nausea, indigestion, and palpitations; felt very weak.  They again discussed consideration for an ablation, however patient preferred watchful waiting and if more severe episodes would consider at that time.  Atrial fibrillation history dates back to 2016 when she was diagnosed after presenting with shortness of breath and palpitations.  Echo at that time showed EF 25 to 30%.  Nuclear stress test at that time showed no ischemia.  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  She underwent TEE/DCCV with successful restoration of NSR, however had recurrent A. fib within 1 month.  She was started on amiodarone at that time.  She saw Dr. Rayann Heman with electrophysiology in 2020 for consideration of an ablation though did not go forward with scheduled procedure.  She presents today for follow-up of her A. fib.  She brings a log of her blood pressures which have been quite labile ranging from as low as 60s/50s up to 150s/80s.  As expected, she feels quite poorly when her blood pressure is low.  Thankfully has had no syncopal events on review of her blood pressure log it appears she is frequently in atrial  fibrillation when her blood pressure is low.  More often than not her blood pressures are stable in the 120s-130s/70s.  Suspect she will need to have some permissive hypertension to accommodate for her low blood pressures.  She has chronic stable dyspnea on exertion.  No complaints of chest pain, shortness of breath at rest, lower extremity edema, orthopnea, PND, hematuria, hematochezia, melena.  She does report poor sleep quality, waking up feeling tired, snoring, insomnia.  She has never had a sleep study.    Past Medical History:  Diagnosis Date  . Chronic systolic CHF (congestive heart failure) ([place])    a. 2D ECHO on 02/10/15 w/ severe LV dysfunction with EF 25-30%. Mild MR and mild LAE. negative nuclear stress test, felt to be due to tachycardia   . Dyspnea    with exertion  . Dysrhythmia    PAF  . Esophageal stricture    a. requiring dilations. crushes pills to take PO  . Full dentures   . GERD (gastroesophageal reflux disease)   . Osteoporosis    sees Dr. Carrolyn Meiers   . PAF (paroxysmal atrial fibrillation) ([place])    a. s/p succesfful DCCV on 02/13/15  . Pneumonia    (08/15/16)- many years ago  . Sebaceous cyst    on back of neck, has seen Dr. Ronnald Collum    Past Surgical History:  Procedure Laterality Date  . BALLOON DILATION N/A 06/22/2018   Procedure: ESOPHAGEAL BALLOON DILATION;  Surgeon: Redmond Baseman,  Orpah Greek, MD;  Location: [place];  Service: ENT;  Laterality: N/A;  . BREAST SURGERY Bilateral    Breast Implants  . CARDIOVERSION N/A 02/13/2015   Procedure: CARDIOVERSION;  Surgeon: Pixie Casino, MD;  Location: [place];  Service: Cardiovascular;  Laterality: N/A;  . COLONOSCOPY  09/24/2002  . ESOPHAGEAL DILATION N/A 08/19/2016   Procedure: ESOPHAGEAL DILATION;  Surgeon: Melida Quitter, MD;  Location: [place];  Service: ENT;  Laterality: N/A;  esophagoscopy with balloon dilation  . ESOPHAGOGASTRODUODENOSCOPY  06/01/2003   12/13  . ESOPHAGOGASTRODUODENOSCOPY (EGD)  WITH ESOPHAGEAL DILATION    . ESOPHAGOSCOPY WITH DILITATION N/A 06/16/2017   Procedure: ESOPHAGOSCOPY WITH BALLOON DILITATION;  Surgeon: Melida Quitter, MD;  Location: [place];  Service: ENT;  Laterality: N/A;  . RIGID ESOPHAGOSCOPY N/A 01/18/2013   Procedure: RIGID ESOPHAGOSCOPY WITH ESPHAGEAL Balloon DILATION ;  Surgeon: Melida Quitter, MD;  Location: [place];  Service: ENT;  Laterality: N/A;  . RIGID ESOPHAGOSCOPY N/A 06/22/2018   Procedure: RIGID ESOPHAGOSCOPY;  Surgeon: Melida Quitter, MD;  Location: [place];  Service: ENT;  Laterality: N/A;  . TEE WITHOUT CARDIOVERSION N/A 02/13/2015   Procedure: TRANSESOPHAGEAL ECHOCARDIOGRAM (TEE);  Surgeon: Pixie Casino, MD;  Location: [facility] ENDOSCOPY;  Service: Cardiovascular;  Laterality: N/A;     Current Outpatient Medications  Medication Sig Dispense Refill  . amiodarone (PACERONE) 200 MG tablet Take 0.5 tablets (100 mg total) by mouth daily. 45 tablet 3  . chlorthalidone (HYGROTON) 25 MG tablet TAKE 1/2 TABLET BY MOUTH EVERY DAY 45 tablet 3  . lansoprazole (PREVACID) 30 MG capsule TAKE 1 CAPSULE BY MOUTH EVERY DAY 90 capsule 3  . losartan (COZAAR) 50 MG tablet Take 1 tablet (50 mg total) by mouth daily. 90 tablet 3  . metoprolol tartrate (LOPRESSOR) 25 MG tablet Take 0.5 tablets (12.5 mg total) by mouth 2 (two) times daily. 90 tablet 3  . Rivaroxaban (XARELTO) 15 MG TABS tablet TAKE 1 TABLET (15 MG TOTAL) BY MOUTH DAILY WITH SUPPER. NOTE DOSE CHANGE 90 tablet 3  . temazepam (RESTORIL) 30 MG capsule Take 1 capsule (30 mg total) by mouth at bedtime as needed. for sleep 90 capsule 1   No current facility-administered medications for this visit.    Allergies:   Ramipril    Social History:  The patient  reports that she has never smoked. She has never used smokeless tobacco. She reports that she does not drink alcohol and does not use drugs.   Family History:  The patient's family history includes  Aneurysm in her father; Diabetes type II in her sister; Heart attack in her brother and brother; Heart attack (age of onset: 22) in her mother; Hypertension in her sister and sister.    ROS:  Please see the history of present illness.   Otherwise, review of systems are positive for none.   All other systems are reviewed and negative.    PHYSICAL EXAM: VS:  BP (!) 142/76   Pulse (!) 57   Ht 5\' 1"  (1.549 m)   Wt 105 lb 6.4 oz (47.8 kg)   BMI 19.92 kg/m  , BMI Body mass index is 19.92 kg/m. GEN: Well nourished, well developed, in no acute distress HEENT: sclera anicteric Neck: no JVD, carotid bruits, or masses Cardiac: RRR; no murmurs, rubs, or gallops, no edema  Respiratory:  clear to auscultation bilaterally, normal work of breathing GI: soft, nontender, nondistended, + BS MS: no deformity or atrophy Skin:  warm and dry, no rash Neuro:  Strength and sensation are intact Psych: euthymic mood, full affect   EKG:  EKG is ordered today. The ekg ordered today demonstrates sinus bradycardia, rate 57 bpm, LVH, non-specific T wave abnormalities, no STE/D   Recent Labs: No results found for requested labs within last 8760 hours.    Lipid Panel    Component Value Date/Time   CHOL 205 (H) 02/22/2019 1117   TRIG 91 02/22/2019 1117   HDL 77 02/22/2019 1117   CHOLHDL 2.7 02/22/2019 1117   CHOLHDL 3.0 02/10/2015 0126   VLDL 12 02/10/2015 0126   LDLCALC 112 (H) 02/22/2019 1117   LDLDIRECT 121.0 03/14/2010 0931      Wt Readings from Last 3 Encounters:  09/13/20 105 lb 6.4 oz (47.8 kg)  04/25/20 107 lb 12.8 oz (48.9 kg)  02/24/20 106 lb (48.1 kg)      Other studies Reviewed: Additional studies/ records that were reviewed today include:   Echocardiogram 2019: Study Conclusions   - Left ventricle: The cavity size was normal. Wall thickness was  normal. Systolic function was normal. The estimated ejection  fraction was in the range of 55% to 60%. Wall motion was normal;   there were no regional wall motion abnormalities. Doppler  parameters are consistent with abnormal left ventricular  relaxation (grade 2 diastolic dysfunction). The E/e&' ratio is  >15, suggesting elevated LV filling pressure.  - Mitral valve: Mildly thickened leaflets . There was trivial  regurgitation.  - Left atrium: The atrium was normal in size.  - Tricuspid valve: There was mild regurgitation.  - Pulmonary arteries: PA peak pressure: 34 mm Hg (S).  - Inferior vena cava: The vessel was normal in size. The  respirophasic diameter changes were in the normal range (>= 50%),  consistent with normal central venous pressure.   Impressions:   - Compared to a prior study in 2017, there are no significant  changes.   NST 2016: 1. No reversible ischemia or infarction. Small, mild fixed defect along the anterior apex.  2. Global hypokinesis.  3.  Decreased left ventricular ejection fraction of 21%.  4. High-risk stress test findings*.   ASSESSMENT AND PLAN:  1.  Paroxysmal atrial fibrillation: Seems to be having more frequent episodes often associated with low blood pressures, heart rates overall seems stable.  She is open to discussing ablation at this time. - Will refer back to Dr. Rayann Heman for ablation consideration versus alternative antiarrhythmic - Continue Xarelto for stroke ppx.  She reports compliance and has not missed any doses - Continue metoprolol for rate control  2.  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  She has stable chronic dyspnea on exertion.  She appears euvolemic on exam. - Continue chlorthalidone, losartan, and metoprolol  3.  Labile hypertension: She brings her BP log today with significant swings in her blood pressure as well as 60s/50s and as high as 150s/80s. Low BP's often seem to correlate with afib episodes. We will have to accept some permissive hypertension given  frequency of low BP with SBP< 100 on ~40% of the documented BP checks - Will reduce losartan to 50 mg daily - Patient instructed to continue to monitor BP closely - Continue chlorthalidone and metoprolol  4.  Insomnia/daytime somnolence/snoring: Longstanding issue with poor sleep quality. - We will check a sleep study- likely a good candidate for home sleep study   Current medicines are reviewed at  length with the patient today.  The patient does not have concerns regarding medicines.  The following changes have been made:  As above  Labs/ tests ordered today include:   Orders Placed This Encounter  Procedures  . EKG 12-Lead  . Split night study     Disposition:   FU with Dr. Rayann Heman in the next 2-3 weeks and Dr. Martinique in 6 months  Signed, Abigail Butts, PA-C  09/13/2020 5:01 PM

## 2020-09-13 NOTE — Patient Instructions (Addendum)
Medication Instructions:  DECREASE- Losartan 50 mg by mouth daily  *If you need a refill on your cardiac medications before your next appointment, please call your pharmacy*   Lab Work: None Ordered   Testing/Procedures: Your physician has recommended that you have a sleep study. This test records several body functions during sleep, including: brain activity, eye movement, oxygen and carbon dioxide blood levels, heart rate and rhythm, breathing rate and rhythm, the flow of air through your mouth and nose, snoring, body muscle movements, and chest and belly movement.   Follow-Up: At [facility], you and your health needs are our priority.  As part of our continuing mission to provide you with exceptional heart care, we have created designated Provider Care Teams.  These Care Teams include your primary Cardiologist (physician) and Advanced Practice Providers (APPs -  Physician Assistants and Nurse Practitioners) who all work together to provide you with the care you need, when you need it.  We recommend signing up for the patient portal called "MyChart".  Sign up information is provided on this After Visit Summary.  MyChart is used to connect with patients for Virtual Visits (Telemedicine).  Patients are able to view lab/test results, encounter notes, upcoming appointments, etc.  Non-urgent messages can be sent to your provider as well.   To learn more about what you can do with MyChart, go to [email or web address].    Your next appointment:   6 month(s)  The format for your next appointment:   In Person  Provider:   You may see Peter Martinique, MD or one of the following Advanced Practice Providers on your designated Care Team:    Almyra Deforest, PA-C  Fabian Sharp, [place] or   Roby Lofts, [place]    Other Instructions Your physician recommends that you schedule a follow-up appointment in: Next Available with Dr Rayann Heman

## 2020-10-04 ENCOUNTER — Encounter: Payer: Self-pay | Admitting: *Deleted

## 2020-10-04 ENCOUNTER — Encounter: Payer: Self-pay | Admitting: Internal Medicine

## 2020-10-04 ENCOUNTER — Other Ambulatory Visit: Payer: Self-pay

## 2020-10-04 ENCOUNTER — Ambulatory Visit: Payer: BC Managed Care – PPO | Admitting: Internal Medicine

## 2020-10-04 VITALS — BP 164/86 | HR 56 | Ht 61.0 in | Wt 104.0 lb

## 2020-10-04 DIAGNOSIS — D6869 Other thrombophilia: Secondary | ICD-10-CM | POA: Diagnosis not present

## 2020-10-04 DIAGNOSIS — I480 Paroxysmal atrial fibrillation: Secondary | ICD-10-CM | POA: Diagnosis not present

## 2020-10-04 DIAGNOSIS — I10 Essential (primary) hypertension: Secondary | ICD-10-CM | POA: Diagnosis not present

## 2020-10-04 NOTE — Progress Notes (Signed)
Electrophysiology Office Note   Date:  10/04/2020   ID:  Mackenzie [name], Mackenzie [name] 1950-08-29, MRN [id number]  PCP:  Laurey Morale, MD  Cardiologist:  Dr Martinique Primary Electrophysiologist: Thompson Grayer, MD    CC: afib   History of Present Illness: Mackenzie [name] is a 70 y.o. female who presents today for electrophysiology evaluation. She has had afib since 2016. She was maintained on amiodarone for several years but subsequently had additional afib.  I saw her 04/2018 and we discussed ablation.  She was scheduled but called back and cancelled the procedure.  She has not been back to see me since that time. Her afib continues to increase in frequency and duration. 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  Today, she denies symptoms of palpitations, chest pain, shortness of breath, orthopnea, PND, lower extremity edema, claudication, dizziness, presyncope, syncope, bleeding, or neurologic sequela. The patient is tolerating medications without difficulties and is otherwise without complaint today.    Past Medical History:  Diagnosis Date   Chronic systolic CHF (congestive heart failure) ([place])    a. 2D ECHO on 02/10/15 w/ severe LV dysfunction with EF 25-30%. Mild MR and mild LAE. negative nuclear stress test, felt to be due to tachycardia    Dyspnea    with exertion   Dysrhythmia    PAF   Esophageal stricture    a. requiring dilations. crushes pills to take PO   Full dentures    GERD (gastroesophageal reflux disease)    Osteoporosis    sees Dr. Carrolyn Meiers    PAF (paroxysmal atrial fibrillation) [facility])    a. s/p succesfful DCCV on 02/13/15   Pneumonia    (08/15/16)- many years ago   Sebaceous cyst    on back of neck, has seen Dr. Ronnald Collum   Past Surgical History:  Procedure Laterality Date   BALLOON DILATION N/A 06/22/2018   Procedure: [place];  Surgeon: Melida Quitter, MD;  Location: [place];  Service: ENT;  Laterality: N/A;   BREAST SURGERY Bilateral    Breast Implants   CARDIOVERSION N/A 02/13/2015   Procedure: CARDIOVERSION;  Surgeon: Pixie Casino, MD;  Location: [place];  Service: Cardiovascular;  Laterality: N/A;   COLONOSCOPY  09/24/2002   ESOPHAGEAL DILATION N/A 08/19/2016   Procedure: ESOPHAGEAL DILATION;  Surgeon: Melida Quitter, MD;  Location: [place];  Service: ENT;  Laterality: N/A;  esophagoscopy with balloon dilation   ESOPHAGOGASTRODUODENOSCOPY  06/01/2003   12/13   ESOPHAGOGASTRODUODENOSCOPY (EGD) WITH ESOPHAGEAL DILATION     ESOPHAGOSCOPY WITH DILITATION N/A 06/16/2017   Procedure: ESOPHAGOSCOPY WITH BALLOON DILITATION;  Surgeon: Melida Quitter, MD;  Location: [place];  Service: ENT;  Laterality: N/A;   RIGID ESOPHAGOSCOPY N/A 01/18/2013   Procedure: RIGID ESOPHAGOSCOPY WITH ESPHAGEAL Balloon DILATION ;  Surgeon: Melida Quitter, MD;  Location: [place];  Service: ENT;  Laterality: N/A;   RIGID ESOPHAGOSCOPY N/A 06/22/2018   Procedure: RIGID ESOPHAGOSCOPY;  Surgeon: Melida Quitter, MD;  Location: [place];  Service: ENT;  Laterality: N/A;   TEE WITHOUT CARDIOVERSION N/A 02/13/2015   Procedure: TRANSESOPHAGEAL ECHOCARDIOGRAM (TEE);  Surgeon: Pixie Casino, MD;  Location: [facility] ENDOSCOPY;  Service: Cardiovascular;  Laterality: N/A;     Current Outpatient Medications  Medication Sig Dispense Refill   amiodarone (PACERONE) 200 MG tablet Take 0.5 tablets (100 mg total) by mouth daily. 45 tablet 3  chlorthalidone (HYGROTON) 25 MG tablet TAKE 1/2 TABLET BY MOUTH EVERY DAY 45 tablet 3   lansoprazole (PREVACID) 30 MG capsule TAKE 1 CAPSULE BY MOUTH EVERY DAY 90 capsule 3   losartan (COZAAR) 50 MG tablet Take 1 tablet (50 mg total) by mouth daily. 90 tablet 3   metoprolol tartrate (LOPRESSOR) 25 MG tablet Take 0.5 tablets (12.5 mg total) by mouth 2 (two) times daily. 90 tablet 3   Rivaroxaban (XARELTO) 15 MG  TABS tablet TAKE 1 TABLET (15 MG TOTAL) BY MOUTH DAILY WITH SUPPER. NOTE DOSE CHANGE 90 tablet 3   temazepam (RESTORIL) 30 MG capsule Take 1 capsule (30 mg total) by mouth at bedtime as needed. for sleep 90 capsule 1   No current facility-administered medications for this visit.    Allergies:   Ramipril   Social History:  The patient  reports that she has never smoked. She has never used smokeless tobacco. She reports that she does not drink alcohol and does not use drugs.   Family History:  The patient's family history includes Aneurysm in her father; Diabetes type II in her sister; Heart attack in her brother and brother; Heart attack (age of onset: 49) in her mother; Hypertension in her sister and sister.    ROS:  Please see the history of present illness.   All other systems are personally reviewed and negative.    PHYSICAL EXAM: VS:  BP (!) 164/86   Pulse (!) 56   Ht 5\' 1"  (1.549 m)   Wt 104 lb (47.2 kg)   SpO2 98%   BMI 19.65 kg/m  , BMI Body mass index is 19.65 kg/m. GEN: Well nourished, well developed, in no acute distress HEENT: normal Neck: no JVD, carotid bruits, or masses Cardiac: RRR; no murmurs, rubs, or gallops,no edema  Respiratory:  clear to auscultation bilaterally, normal work of breathing GI: soft, nontender, nondistended, + BS MS: no deformity or atrophy Skin: warm and dry  Neuro:  Strength and sensation are intact Psych: euthymic mood, full affect  EKG:  EKG is ordered today. The ekg ordered today is personally reviewed and shows sinus bradycardia 56 bpm, PR 206 msec   Recent Labs: No results found for requested labs within last 8760 hours.  personally reviewed   Lipid Panel     Component Value Date/Time   CHOL 205 (H) 02/22/2019 1117   TRIG 91 02/22/2019 1117   HDL 77 02/22/2019 1117   CHOLHDL 2.7 02/22/2019 1117   CHOLHDL 3.0 02/10/2015 0126   VLDL 12 02/10/2015 0126   LDLCALC 112 (H) 02/22/2019 1117   LDLDIRECT 121.0 03/14/2010 0931    personally reviewed   Wt Readings from Last 3 Encounters:  10/04/20 104 lb (47.2 kg)  09/13/20 105 lb 6.4 oz (47.8 kg)  04/25/20 107 lb 12.8 oz (48.9 kg)      Other studies personally reviewed: Additional studies/ records that were reviewed today include: Dr Morrison Old team notes, my prior notes reviewed, prior echo and ekgs reviewed  Review of the above records today demonstrates: as above   ASSESSMENT AND PLAN:  1.  Paroxysmal atrial fibrillation The patient has symptomatic, recurrent  atrial fibrillation. she has failed medical therapy with amiodarone. she is anticoagulated with xarleto.  No labs in the past year.  I will update bmet and cbc.  Will need to make sure CrCl supports currently xarelto dose (15mg  daily). Also update echo Therapeutic strategies for afib including medicine and ablation were discussed in detail with the  patient today. Risk, benefits, and alternatives to EP study and radiofrequency ablation for afib were also discussed in detail today. These risks include but are not limited to stroke, bleeding, vascular damage, tamponade, perforation, damage to the esophagus, lungs, and other structures, pulmonary vein stenosis, worsening renal function, and death. The patient understands these risk and wishes to proceed.  We will therefore proceed with catheter ablation at the next available time.  Carto, ICE, anesthesia are requested for the procedure.  Will also obtain cardiac CT prior to the procedure to exclude LAA thrombus and further evaluate atrial anatomy.   Risks, benefits and potential toxicities for medications prescribed and/or refilled reviewed with patient today.   2. Prior tachycardia mediated CM Update echo (last echo 2019 reviewed)  3. HTN Continue losartan 50mg  daily     Signed, Thompson Grayer, MD  10/04/2020 1:11 PM     [address] [place] [address] [phone] (office) [phone] (fax)

## 2020-10-04 NOTE — Patient Instructions (Addendum)
Medication Instructions:  Your physician recommends that you continue on your current medications as directed. Please refer to the Current Medication list given to you today.  Labwork: BMP, CBC  Testing/Procedures: Your physician has requested that you have an echocardiogram. Echocardiography is a painless test that uses sound waves to create images of your heart. It provides your doctor with information about the size and shape of your heart and how well your heart's chambers and valves are working. This procedure takes approximately one hour. There are no restrictions for this procedure.   Your physician has requested that you have cardiac CT. Cardiac computed tomography (CT) is a painless test that uses an x-ray machine to take clear, detailed pictures of your heart. For further information please visit [email or web address]. Please follow instruction sheet as given.   Your physician has recommended that you have an ablation. Catheter ablation is a medical procedure used to treat some cardiac arrhythmias (irregular heartbeats). During catheter ablation, a long, thin, flexible tube is put into a blood vessel in your groin (upper thigh), or neck. This tube is called an ablation catheter. It is then guided to your heart through the blood vessel. Radio frequency waves destroy small areas of heart tissue where abnormal heartbeats may cause an arrhythmia to start. Please see the instruction sheet given to you today.    Any Other Special Instructions Will Be Listed Below (If Applicable).  If you need a refill on your cardiac medications before your next appointment, please call your pharmacy.   Cardiac Ablation Cardiac ablation is a procedure to destroy (ablate) some heart tissue that is sending bad signals. These bad signals causeproblems in heart rhythm. The heart has many areas that make these signals. If there are problems in these areas, they can make the heart beat in a way that is not  normal.Destroying some tissues can help make the heart rhythm normal. Tell your doctor about: Any allergies you have. All medicines you are taking. These include vitamins, herbs, eye drops, creams, and over-the-counter medicines. Any problems you or family members have had with medicines that make you fall asleep (anesthetics). Any blood disorders you have. Any surgeries you have had. Any medical conditions you have, such as kidney failure. Whether you are pregnant or may be pregnant. What are the risks? This is a safe procedure. But problems may occur, including: Infection. Bruising and bleeding. Bleeding into the chest. Stroke or blood clots. Damage to nearby areas of your body. Allergies to medicines or dyes. The need for a pacemaker if the normal system is damaged. Failure of the procedure to treat the problem. What happens before the procedure? Medicines Ask your doctor about: Changing or stopping your normal medicines. This is important. Taking aspirin and ibuprofen. Do not take these medicines unless your doctor tells you to take them. Taking other medicines, vitamins, herbs, and supplements. General instructions Follow instructions from your doctor about what you cannot eat or drink. Plan to have someone take you home from the hospital or clinic. If you will be going home right after the procedure, plan to have someone with you for 24 hours. Ask your doctor what steps will be taken to prevent infection. What happens during the procedure?  An IV tube will be put into one of your veins. You will be given a medicine to help you relax. The skin on your neck or groin will be numbed. A cut (incision) will be made in your neck or groin. A needle will be put  through your cut and into a large vein. A tube (catheter) will be put into the needle. The tube will be moved to your heart. Dye may be put through the tube. This helps your doctor see your heart. Small devices (electrodes)  on the tube will send out signals. A type of energy will be used to destroy some heart tissue. The tube will be taken out. Pressure will be held on your cut. This helps stop bleeding. A bandage will be put over your cut. The exact procedure may vary among doctors and hospitals. What happens after the procedure? You will be watched until you leave the hospital or clinic. This includes checking your heart rate, breathing rate, oxygen, and blood pressure. Your cut will be watched for bleeding. You will need to lie still for a few hours. Do not drive for 24 hours or as long as your doctor tells you. Summary Cardiac ablation is a procedure to destroy some heart tissue. This is done to treat heart rhythm problems. Tell your doctor about any medical conditions you may have. Tell him or her about all medicines you are taking to treat them. This is a safe procedure. But problems may occur. These include infection, bruising, bleeding, and damage to nearby areas of your body. Follow what your doctor tells you about food and drink. You may also be told to change or stop some of your medicines. After the procedure, do not drive for 24 hours or as long as your doctor tells you. This information is not intended to replace advice given to you by your health care provider. Make sure you discuss any questions you have with your healthcare provider. Document Revised: 03/04/2019 Document Reviewed: 03/04/2019 Elsevier Patient Education  2022 [employer].

## 2020-10-05 LAB — CBC
Hematocrit: 38.9 % (ref 34.0–46.6)
Hemoglobin: 12.4 g/dL (ref 11.1–15.9)
MCH: 29 pg (ref 26.6–33.0)
MCHC: 31.9 g/dL (ref 31.5–35.7)
MCV: 91 fL (ref 79–97)
Platelets: 263 10*3/uL (ref 150–450)
RBC: 4.27 x10E6/uL (ref 3.77–5.28)
RDW: 12.5 % (ref 11.7–15.4)
WBC: 6.8 10*3/uL (ref 3.4–10.8)

## 2020-10-05 LAB — BASIC METABOLIC PANEL
BUN/Creatinine Ratio: 20 (ref 12–28)
BUN: 26 mg/dL (ref 8–27)
CO2: 24 mmol/L (ref 20–29)
Calcium: 9.9 mg/dL (ref 8.7–10.3)
Chloride: 104 mmol/L (ref 96–106)
Creatinine, Ser: 1.31 mg/dL — ABNORMAL HIGH (ref 0.57–1.00)
Glucose: 92 mg/dL (ref 65–99)
Potassium: 4.5 mmol/L (ref 3.5–5.2)
Sodium: 141 mmol/L (ref 134–144)
eGFR: 44 mL/min/{1.73_m2} — ABNORMAL LOW (ref >59)

## 2020-10-27 ENCOUNTER — Ambulatory Visit (HOSPITAL_COMMUNITY): Payer: BC Managed Care – PPO | Attending: Internal Medicine

## 2020-10-27 ENCOUNTER — Other Ambulatory Visit: Payer: Self-pay

## 2020-10-27 DIAGNOSIS — I10 Essential (primary) hypertension: Secondary | ICD-10-CM | POA: Diagnosis not present

## 2020-10-27 DIAGNOSIS — I480 Paroxysmal atrial fibrillation: Secondary | ICD-10-CM | POA: Diagnosis not present

## 2020-10-27 DIAGNOSIS — D6869 Other thrombophilia: Secondary | ICD-10-CM | POA: Insufficient documentation

## 2020-10-27 LAB — ECHOCARDIOGRAM COMPLETE
Area-P 1/2: 5.46 cm2
S' Lateral: 2.7 cm

## 2020-11-03 ENCOUNTER — Other Ambulatory Visit: Payer: Self-pay

## 2020-11-03 ENCOUNTER — Other Ambulatory Visit: Payer: BC Managed Care – PPO | Admitting: *Deleted

## 2020-11-03 DIAGNOSIS — I480 Paroxysmal atrial fibrillation: Secondary | ICD-10-CM | POA: Diagnosis not present

## 2020-11-03 DIAGNOSIS — D6869 Other thrombophilia: Secondary | ICD-10-CM | POA: Diagnosis not present

## 2020-11-03 DIAGNOSIS — I10 Essential (primary) hypertension: Secondary | ICD-10-CM | POA: Diagnosis not present

## 2020-11-03 LAB — BASIC METABOLIC PANEL
BUN/Creatinine Ratio: 16 (ref 12–28)
BUN: 20 mg/dL (ref 8–27)
CO2: 22 mmol/L (ref 20–29)
Calcium: 10 mg/dL (ref 8.7–10.3)
Chloride: 105 mmol/L (ref 96–106)
Creatinine, Ser: 1.28 mg/dL — ABNORMAL HIGH (ref 0.57–1.00)
Glucose: 83 mg/dL (ref 65–99)
Potassium: 4.7 mmol/L (ref 3.5–5.2)
Sodium: 141 mmol/L (ref 134–144)
eGFR: 45 mL/min/{1.73_m2} — ABNORMAL LOW (ref >59)

## 2020-11-03 LAB — CBC WITH DIFFERENTIAL/PLATELET
Basophils Absolute: 0.1 10*3/uL (ref 0.0–0.2)
Basos: 1 %
EOS (ABSOLUTE): 0.1 10*3/uL (ref 0.0–0.4)
Eos: 3 %
Hematocrit: 37.6 % (ref 34.0–46.6)
Hemoglobin: 12.1 g/dL (ref 11.1–15.9)
Immature Grans (Abs): 0 10*3/uL (ref 0.0–0.1)
Immature Granulocytes: 1 %
Lymphocytes Absolute: 1.3 10*3/uL (ref 0.7–3.1)
Lymphs: 24 %
MCH: 29.7 pg (ref 26.6–33.0)
MCHC: 32.2 g/dL (ref 31.5–35.7)
MCV: 92 fL (ref 79–97)
Monocytes Absolute: 0.5 10*3/uL (ref 0.1–0.9)
Monocytes: 10 %
Neutrophils Absolute: 3.3 10*3/uL (ref 1.4–7.0)
Neutrophils: 61 %
Platelets: 260 10*3/uL (ref 150–450)
RBC: 4.08 x10E6/uL (ref 3.77–5.28)
RDW: 12.6 % (ref 11.7–15.4)
WBC: 5.3 10*3/uL (ref 3.4–10.8)

## 2020-11-04 ENCOUNTER — Other Ambulatory Visit: Payer: Self-pay | Admitting: Cardiology

## 2020-11-20 ENCOUNTER — Telehealth: Payer: Self-pay | Admitting: Internal Medicine

## 2020-11-20 DIAGNOSIS — I480 Paroxysmal atrial fibrillation: Secondary | ICD-10-CM

## 2020-11-20 NOTE — Telephone Encounter (Signed)
   Pt said her husband tested covid+ but she is negative, she's been exposed since they're living together. She wanted to know if she needs to r/s her procdure

## 2020-11-20 NOTE — Telephone Encounter (Signed)
I spoke with the patient at length.  Her husband does not feel well and will not be able to bring her to the hospital tomorrow.   The patient is also worried about her exposure and that she could get covid over the coming days which would substantially affect her recovery from ablation.  We will therefore cancel her ablation for tomorrow and reschedule at the next available time.  Thompson Grayer MD, [place] 11/20/2020 10:23 AM

## 2020-11-21 ENCOUNTER — Ambulatory Visit (HOSPITAL_COMMUNITY)
Admission: RE | Admit: 2020-11-21 | Payer: BC Managed Care – PPO | Source: Home / Self Care | Admitting: Internal Medicine

## 2020-11-21 ENCOUNTER — Encounter (HOSPITAL_COMMUNITY): Admission: RE | Payer: Self-pay | Source: Home / Self Care

## 2020-11-21 ENCOUNTER — Telehealth: Payer: Self-pay | Admitting: Cardiology

## 2020-11-21 SURGERY — ATRIAL FIBRILLATION ABLATION
Anesthesia: General

## 2020-11-21 NOTE — Telephone Encounter (Signed)
Patient is requesting to speak with Dr. Doug Sou nurse. She states her husband tested positive for COVID on 11/18/20 and she has been exposed. She states she hasn't tested positive yet, but she's been having symptoms. She would like to know if Dr. Martinique can prescribe something to assist with her symptoms.  Please return call to discuss further.

## 2020-11-21 NOTE — Telephone Encounter (Signed)
Agree 

## 2020-11-21 NOTE — Telephone Encounter (Signed)
Called patient back and encouraged her to call her PCP. Will forward to Dr. Martinique and his nurse to see if they have any other advisement.

## 2020-11-22 DIAGNOSIS — J069 Acute upper respiratory infection, unspecified: Secondary | ICD-10-CM | POA: Diagnosis not present

## 2020-11-22 DIAGNOSIS — Z20822 Contact with and (suspected) exposure to covid-19: Secondary | ICD-10-CM | POA: Diagnosis not present

## 2020-11-22 NOTE — Telephone Encounter (Signed)
Spoke to patient she stated she tested negative for covid.Stated she had to cancel ablation appointment.She is wanting on call to reschedule.6 month follow up appointment scheduled with Dr.Jordan 11/30 at 9:30 am.

## 2020-11-28 ENCOUNTER — Encounter: Payer: Self-pay | Admitting: Family Medicine

## 2020-11-28 ENCOUNTER — Telehealth (INDEPENDENT_AMBULATORY_CARE_PROVIDER_SITE_OTHER): Payer: BC Managed Care – PPO | Admitting: Family Medicine

## 2020-11-28 VITALS — BP 131/77 | HR 63 | Temp 98.0°F

## 2020-11-28 DIAGNOSIS — U071 COVID-19: Secondary | ICD-10-CM | POA: Diagnosis not present

## 2020-11-28 NOTE — Progress Notes (Signed)
Virtual Visit via Telephone Note  I connected with Mackenzie [name] on 11/28/20 at 11:20 AM EDT by telephone and verified that I am speaking with the correct person using two identifiers.   I discussed the limitations, risks, security and privacy concerns of performing an evaluation and management service by telephone and the availability of in person appointments. I also discussed with the patient that there may be a patient responsible charge related to this service. The patient expressed understanding and agreed to proceed.  Location patient: home, [place] Location provider: work or home office Participants present for the call: patient, provider, patient's husband Patient did not have a visit with me in the prior 7 days to address this/these issue(s).   History of Present Illness:  Acute telemedicine visit for Covid19: -Onset: had covid that started about 9 days ago -she tested positive for covid at home and husband has covid -she went to urgent care and because she tested negative there they treated her with doxy and prednisone "just in case" per patient -she still has a sore throat, cough and diarrhea and wonders if she can stop the abx -Symptoms include:sore throat, cough, congestion, diarrhea -Denies: CP, SOB, vomiting, diarrhea, inability to eat/drink/get out of bed -Pertinent past medical history:  see below -Pertinent medication allergies:  Allergies  Allergen Reactions   Ramipril Cough  -COVID-19 vaccine status: none  Past Medical History:  Diagnosis Date   Chronic systolic CHF (congestive heart failure) ([place])    a. 2D ECHO on 02/10/15 w/ severe LV dysfunction with EF 25-30%. Mild MR and mild LAE. negative nuclear stress test, felt to be due to tachycardia    Dyspnea    with exertion   Dysrhythmia    PAF   Esophageal stricture    a. requiring dilations. crushes pills to take PO   Full dentures    GERD (gastroesophageal reflux disease)    Osteoporosis    sees Dr. Carrolyn Meiers    PAF (paroxysmal atrial fibrillation) [facility])    a. s/p succesfful DCCV on 02/13/15   Pneumonia    (08/15/16)- many years ago   Sebaceous cyst    on back of neck, has seen Dr. Ronnald Collum    Observations/Objective: Patient sounds cheerful and well on the phone. I do not appreciate any SOB. Speech and thought processing are grossly intact. Patient reported vitals:  Assessment and Plan:  COVID-19  Most likely covid19. Have been seeing lots of folks feel poorly and continue to test positive for > 10 days with this variant. Discussed treatment options (infusions and oral options and risk of drug interactions), ideal treatment window, potential complications, isolation and precautions for COVID-19.  She is out of the treatment window for antivirals. She opted to stop the abx and did not take the prednisone. Imodium and dairy free diet for the diarrhea. She prefers to use OTC and home remedies for the other symptoms.  Other symptomatic care measures summarized in patient instructions. Scheduled follow up with PCP offered: she has opted for as needed follow up with PCP office, [facility] or other Advised to seek prompt in person care if worsening, new symptoms arise, or if is not improving with treatment. Advised of options for inperson care in case PCP office not available. Did let the patient know that I only do telemedicine shifts for [place] on Tuesdays and Thursdays and advised a follow up visit with PCP or at an [facility] if has further questions or concerns.   Follow Up  Instructions:  I did not refer this patient for an OV with me in the next 24 hours for this/these issue(s).  I discussed the assessment and treatment plan with the patient. The patient was provided an opportunity to ask questions and all were answered. The patient agreed with the plan and demonstrated an understanding of the instructions.   I spent 18 minutes on the date of this visit in the care of this patient. See summary of  tasks completed to properly care for this patient in the detailed notes above which also included counseling of above, review of PMH, medications, allergies, evaluation of the patient and ordering and/or  instructing patient on testing and care options.     Lucretia Kern, DO

## 2020-11-28 NOTE — Patient Instructions (Addendum)
  HOME CARE TIPS:  -can use tylenol if needed for fevers, aches and pains per instructions  -can use nasal saline a few times per day if you have nasal congestion  -stay hydrated, drink plenty of fluids and eat small healthy meals - avoid dairy  -can use imodium per instructions if needed for the diarrhea  -follow up with your doctor in 2-3 days unless improving and feeling better  -stay home while sick, except to seek medical care. If you have COVID19, ideally it would be best to stay home for a full 10 days since the onset of symptoms PLUS one day of no fever and feeling better. Wear a good mask that fits snugly (such as N95 or KN95) if around others to reduce the risk of transmission.  It was nice to meet you today, and I really hope you are feeling better soon. I help [place] out with telemedicine visits on Tuesdays and Thursdays and am available for visits on those days. If you have any concerns or questions following this visit please schedule a follow up visit with your Primary Care doctor or seek care at a local urgent care clinic to avoid delays in care.    Seek in person care or schedule a follow up video visit promptly if your symptoms worsen, new concerns arise or you are not improving with treatment. Call 911 and/or seek emergency care if your symptoms are severe or life threatening.

## 2020-11-30 ENCOUNTER — Telehealth: Payer: Self-pay

## 2020-11-30 NOTE — Telephone Encounter (Signed)
**Note De-Identified  Obfuscation** I started a Xarelto PA through covermymeds and received the following message:  olar wirsing Key: [facility] - PA Case ID: [id number] - Rx #[id number] Outcome: This request has been approved using information available on the patient's profile.  Coverage Start Date:10/31/2020;Coverage End Date:11/30/2021; Drug: Xarelto '15MG'$  tablets Form: [employer] Electronic PA Form (2017 NCPDP)  I have notified [facility] pharmacy of this approval.

## 2020-12-01 NOTE — Telephone Encounter (Signed)
Patient is returning call.  °

## 2020-12-01 NOTE — Telephone Encounter (Signed)
Lab and procedure date picked. Patient preferred to write new date on instructions she already had, she read back with correct dates.

## 2020-12-01 NOTE — Addendum Note (Signed)
Addended by: Darrell Jewel on: 12/01/2020 12:33 PM   Modules accepted: Orders

## 2020-12-02 ENCOUNTER — Other Ambulatory Visit: Payer: Self-pay | Admitting: Cardiology

## 2020-12-04 ENCOUNTER — Telehealth: Payer: Self-pay | Admitting: Cardiology

## 2020-12-04 ENCOUNTER — Telehealth: Payer: Self-pay

## 2020-12-04 NOTE — Telephone Encounter (Signed)
Spoke to patient Dr.Jordan advised to stop taking chlorthalidone.Advised to call back if B/P continues to be low.

## 2020-12-04 NOTE — Telephone Encounter (Signed)
If BP is low would recommend holding chlorthalidone.  Paxson Harrower Martinique MD, [facility]

## 2020-12-04 NOTE — Telephone Encounter (Signed)
Left message for patient to call back  

## 2020-12-04 NOTE — Telephone Encounter (Signed)
Patient called stating that after taking doxycycline (ADOXA) 100 MG tablet she began to have a sore throat and itchy bottom patient wants to know if something can be sent to the pharmacy

## 2020-12-04 NOTE — Telephone Encounter (Signed)
Follow up:       Patient returning the nurse call back. Concerning BP

## 2020-12-04 NOTE — Telephone Encounter (Signed)
Pt c/o BP issue: STAT if pt c/o blurred vision, one-sided weakness or slurred speech  1. What are your last 5 BP readings? 122/87   2. Are you having any other symptoms (ex. Dizziness, headache, blurred vision, passed out)? No pt doesn't have any energy  3. What is your BP issue? Pt feels like her BP is too low, she would like to be able to go back to work soon, she have been out of work with Covid since 08/'10/22 84/66 86/62 '$ 79/65  Patient c/o Palpitations:  High priority if patient c/o lightheadedness, shortness of breath, or chest pain  How long have you had palpitations/irregular HR/ Afib? Are you having the symptoms now?  Afib started on 11/23/20  Are you currently experiencing lightheadedness, SOB or CP? No SOB, CP OR Lightheadedness, pt doesn't have any energy  Do you have a history of afib (atrial fibrillation) or irregular heart rhythm? Yes she have had Afib for 3 years  Have you checked your BP or HR? (document readings if available): 122/87   Are you experiencing any other symptoms? No energy

## 2020-12-04 NOTE — Telephone Encounter (Signed)
Prescription refill request for Xarelto received.  Indication:afib Last office visit:allred 10/04/20 Weight:47.2kg Age:70 Scr:1.28 11/03/20 CrCl:30

## 2020-12-04 NOTE — Telephone Encounter (Signed)
Returned call to Pt.  Has been having fatigue associated with low blood pressures.  She states when her losartan was reduced to 50 mg- she "felt like myself for a while".  She said now the feeling of fatigue is returned with lower blood pressures.  Advised would send a message to Dr. Martinique to see if he wanted to make a med change.

## 2020-12-05 ENCOUNTER — Other Ambulatory Visit: Payer: Self-pay

## 2020-12-05 MED ORDER — FLUCONAZOLE 150 MG PO TABS: 150.0000 mg | ORAL_TABLET | Freq: Once | ORAL | 1 refills | Status: AC

## 2020-12-05 NOTE — Telephone Encounter (Signed)
This sounds like a possible yeast infection. Call in Diflucan 150 mg to take once a day for 5 days, plus one refill

## 2020-12-05 NOTE — Telephone Encounter (Signed)
Spoke with pt verbalized understanding that Dr Sarajane Jews prescribed Diflucan for her yeast infection from taking Antibiotic, Rx sent to pt pharmacy

## 2020-12-05 NOTE — Telephone Encounter (Signed)
Pt LOV was 11/28/2020 video visit with Dr Maudie Mercury Rx for Doxycycline given on 11/22/2020 Please advise

## 2020-12-13 ENCOUNTER — Other Ambulatory Visit: Payer: Self-pay | Admitting: Cardiology

## 2020-12-15 MED ORDER — CHLORTHALIDONE 25 MG PO TABS
12.5000 mg | ORAL_TABLET | ORAL | 5 refills | Status: DC
Start: 2020-12-15 — End: 2021-02-13

## 2020-12-15 NOTE — Telephone Encounter (Signed)
Pt c/o BP issue: STAT if pt c/o blurred vision, one-sided weakness or slurred speech  1. What are your last 5 BP readings?  12/05/20 138/70 HR 62 12/06/20 153/77  12/07/20 143/69 12/09/20 135/91 12/10/20 123/78 12/12/20 145/72 12/13/20 10:00 am 159/80 5:00 pm 151/73 12/14/20 164/81 12/15/20 186/93 190/98   2. Are you having any other symptoms (ex. Dizziness, headache, blurred vision, passed out)? Headache   3. What is your BP issue? Having hypertension since taken off of chlorthalidone.

## 2020-12-15 NOTE — Addendum Note (Signed)
Addended by: Fidel Levy on: 12/15/2020 04:20 PM   Modules accepted: Orders

## 2020-12-15 NOTE — Telephone Encounter (Signed)
Lets try resuming chlorthalidone 12.5 mg every other day and monitor.  Mackenzie Bullinger Martinique MD, [facility]

## 2020-12-15 NOTE — Telephone Encounter (Signed)
Patient aware of med change per MD. Rx(s) sent to pharmacy electronically.

## 2020-12-19 ENCOUNTER — Ambulatory Visit (HOSPITAL_COMMUNITY): Payer: BC Managed Care – PPO | Admitting: Nurse Practitioner

## 2020-12-29 ENCOUNTER — Other Ambulatory Visit: Payer: BC Managed Care – PPO | Admitting: *Deleted

## 2020-12-29 ENCOUNTER — Other Ambulatory Visit: Payer: Self-pay

## 2020-12-29 DIAGNOSIS — I480 Paroxysmal atrial fibrillation: Secondary | ICD-10-CM

## 2020-12-29 LAB — CBC WITH DIFFERENTIAL/PLATELET
Basophils Absolute: 0.1 10*3/uL (ref 0.0–0.2)
Basos: 1 %
EOS (ABSOLUTE): 0.1 10*3/uL (ref 0.0–0.4)
Eos: 2 %
Hematocrit: 35.2 % (ref 34.0–46.6)
Hemoglobin: 11.7 g/dL (ref 11.1–15.9)
Immature Grans (Abs): 0 10*3/uL (ref 0.0–0.1)
Immature Granulocytes: 1 %
Lymphocytes Absolute: 1.5 10*3/uL (ref 0.7–3.1)
Lymphs: 24 %
MCH: 29.9 pg (ref 26.6–33.0)
MCHC: 33.2 g/dL (ref 31.5–35.7)
MCV: 90 fL (ref 79–97)
Monocytes Absolute: 0.6 10*3/uL (ref 0.1–0.9)
Monocytes: 10 %
Neutrophils Absolute: 4.1 10*3/uL (ref 1.4–7.0)
Neutrophils: 62 %
Platelets: 256 10*3/uL (ref 150–450)
RBC: 3.91 x10E6/uL (ref 3.77–5.28)
RDW: 12.5 % (ref 11.7–15.4)
WBC: 6.5 10*3/uL (ref 3.4–10.8)

## 2020-12-29 LAB — BASIC METABOLIC PANEL
BUN/Creatinine Ratio: 17 (ref 12–28)
BUN: 24 mg/dL (ref 8–27)
CO2: 24 mmol/L (ref 20–29)
Calcium: 9.2 mg/dL (ref 8.7–10.3)
Chloride: 103 mmol/L (ref 96–106)
Creatinine, Ser: 1.39 mg/dL — ABNORMAL HIGH (ref 0.57–1.00)
Glucose: 86 mg/dL (ref 65–99)
Potassium: 4.2 mmol/L (ref 3.5–5.2)
Sodium: 139 mmol/L (ref 134–144)
eGFR: 41 mL/min/{1.73_m2} — ABNORMAL LOW (ref >59)

## 2021-01-05 ENCOUNTER — Telehealth (HOSPITAL_COMMUNITY): Payer: Self-pay | Admitting: *Deleted

## 2021-01-05 NOTE — Telephone Encounter (Signed)
Reaching out to patient to offer assistance regarding upcoming cardiac imaging study; pt verbalizes understanding of appt date/time, parking situation and where to check in, pre-test NPO status and medications ordered, and verified current allergies; name and call back number provided for further questions should they arise  Jamesyn Moorefield RN Navigator Cardiac Imaging [facility] [phone] office [phone] cell  

## 2021-01-05 NOTE — Telephone Encounter (Signed)
Attempted to call patient regarding upcoming cardiac CT appointment. °Left message on voicemail with name and callback number ° °Diyari Cherne RN Navigator Cardiac Imaging °[facility] °[phone] Office °[phone] Cell ° °

## 2021-01-09 ENCOUNTER — Encounter (HOSPITAL_COMMUNITY): Payer: Self-pay

## 2021-01-09 ENCOUNTER — Ambulatory Visit (HOSPITAL_COMMUNITY)
Admission: RE | Admit: 2021-01-09 | Discharge: 2021-01-09 | Disposition: A | Discharge status: Home / Self Care | Payer: BC Managed Care – PPO | Source: Ambulatory Visit | Attending: Internal Medicine | Admitting: Internal Medicine

## 2021-01-09 DIAGNOSIS — I10 Essential (primary) hypertension: Secondary | ICD-10-CM | POA: Diagnosis not present

## 2021-01-09 DIAGNOSIS — D6869 Other thrombophilia: Secondary | ICD-10-CM

## 2021-01-09 DIAGNOSIS — I480 Paroxysmal atrial fibrillation: Secondary | ICD-10-CM

## 2021-01-09 MED ORDER — IOHEXOL 350 MG/ML SOLN
80.0000 mL | Freq: Once | INTRAVENOUS | Status: AC | PRN
  Administered 2021-01-09: 80 mL via INTRAVENOUS

## 2021-01-15 NOTE — Pre-Procedure Instructions (Signed)
Attempted to call patient regarding procedure instructions for tomorrow.  Left voice mail on the following items.  Arrival time 0830 Nothing to eat or drink after midnight No meds AM of procedure Responsible person to drive you home and stay with you for 24 hrs  Have you missed any doses of anti-coagulant Xarelto- take tonight's dose, don't take any in the morning

## 2021-01-16 ENCOUNTER — Ambulatory Visit (HOSPITAL_COMMUNITY)
Admission: RE | Admit: 2021-01-16 | Discharge: 2021-01-16 | Disposition: A | Discharge status: Home / Self Care | Payer: BC Managed Care – PPO | Attending: Internal Medicine | Admitting: Internal Medicine

## 2021-01-16 ENCOUNTER — Encounter (HOSPITAL_COMMUNITY)
Admission: RE | Disposition: A | Discharge status: Home / Self Care | Payer: Self-pay | Source: Home / Self Care | Attending: Internal Medicine

## 2021-01-16 ENCOUNTER — Ambulatory Visit (HOSPITAL_COMMUNITY): Payer: BC Managed Care – PPO | Admitting: Certified Registered"

## 2021-01-16 ENCOUNTER — Other Ambulatory Visit: Payer: Self-pay

## 2021-01-16 DIAGNOSIS — Z888 Allergy status to other drugs, medicaments and biological substances status: Secondary | ICD-10-CM | POA: Diagnosis not present

## 2021-01-16 DIAGNOSIS — K219 Gastro-esophageal reflux disease without esophagitis: Secondary | ICD-10-CM | POA: Insufficient documentation

## 2021-01-16 DIAGNOSIS — Z9882 Breast implant status: Secondary | ICD-10-CM | POA: Diagnosis not present

## 2021-01-16 DIAGNOSIS — I5023 Acute on chronic systolic (congestive) heart failure: Secondary | ICD-10-CM | POA: Diagnosis not present

## 2021-01-16 DIAGNOSIS — Z79899 Other long term (current) drug therapy: Secondary | ICD-10-CM | POA: Insufficient documentation

## 2021-01-16 DIAGNOSIS — Z8249 Family history of ischemic heart disease and other diseases of the circulatory system: Secondary | ICD-10-CM | POA: Diagnosis not present

## 2021-01-16 DIAGNOSIS — I480 Paroxysmal atrial fibrillation: Secondary | ICD-10-CM | POA: Insufficient documentation

## 2021-01-16 DIAGNOSIS — I5022 Chronic systolic (congestive) heart failure: Secondary | ICD-10-CM | POA: Insufficient documentation

## 2021-01-16 DIAGNOSIS — Z7901 Long term (current) use of anticoagulants: Secondary | ICD-10-CM | POA: Insufficient documentation

## 2021-01-16 DIAGNOSIS — I110 Hypertensive heart disease with heart failure: Secondary | ICD-10-CM | POA: Diagnosis not present

## 2021-01-16 HISTORY — PX: ATRIAL FIBRILLATION ABLATION: EP1191

## 2021-01-16 LAB — POCT ACTIVATED CLOTTING TIME
Activated Clotting Time: 312 seconds
Activated Clotting Time: 353 seconds
Activated Clotting Time: 358 seconds

## 2021-01-16 SURGERY — ATRIAL FIBRILLATION ABLATION
Anesthesia: General

## 2021-01-16 MED ORDER — ISOPROTERENOL HCL 0.2 MG/ML IJ SOLN
INTRAVENOUS | Status: DC | PRN
  Administered 2021-01-16: 10 ug/min via INTRAVENOUS

## 2021-01-16 MED ORDER — HEPARIN (PORCINE) IN NACL 1000-0.9 UT/500ML-% IV SOLN
INTRAVENOUS | Status: AC
  Filled 2021-01-16: qty 500

## 2021-01-16 MED ORDER — PHENYLEPHRINE HCL (PRESSORS) 10 MG/ML IV SOLN
INTRAVENOUS | Status: DC | PRN
  Administered 2021-01-16: 40 ug via INTRAVENOUS

## 2021-01-16 MED ORDER — ACETAMINOPHEN 325 MG PO TABS
650.0000 mg | ORAL_TABLET | ORAL | Status: DC | PRN
  Filled 2021-01-16: qty 2

## 2021-01-16 MED ORDER — DEXAMETHASONE SODIUM PHOSPHATE 10 MG/ML IJ SOLN
INTRAMUSCULAR | Status: DC | PRN
  Administered 2021-01-16: 5 mg via INTRAVENOUS

## 2021-01-16 MED ORDER — PROTAMINE SULFATE 10 MG/ML IV SOLN
INTRAVENOUS | Status: DC | PRN
  Administered 2021-01-16: 10 mg via INTRAVENOUS

## 2021-01-16 MED ORDER — SODIUM CHLORIDE 0.9% FLUSH: 3.0000 mL | INTRAVENOUS | Status: DC | PRN

## 2021-01-16 MED ORDER — FENTANYL CITRATE (PF) 100 MCG/2ML IJ SOLN
INTRAMUSCULAR | Status: DC | PRN
  Administered 2021-01-16: 50 ug via INTRAVENOUS

## 2021-01-16 MED ORDER — ISOPROTERENOL HCL 0.2 MG/ML IJ SOLN
INTRAMUSCULAR | Status: AC
  Filled 2021-01-16: qty 5

## 2021-01-16 MED ORDER — SODIUM CHLORIDE 0.9% FLUSH: 3.0000 mL | Freq: Two times a day (BID) | INTRAVENOUS | Status: DC

## 2021-01-16 MED ORDER — HEPARIN (PORCINE) IN NACL 1000-0.9 UT/500ML-% IV SOLN
INTRAVENOUS | Status: DC | PRN
  Administered 2021-01-16 (×3): 500 mL

## 2021-01-16 MED ORDER — PROPOFOL 10 MG/ML IV BOLUS
INTRAVENOUS | Status: DC | PRN
  Administered 2021-01-16: 60 mg via INTRAVENOUS

## 2021-01-16 MED ORDER — ACETAMINOPHEN 500 MG PO TABS: 1000.0000 mg | ORAL_TABLET | Freq: Once | ORAL | Status: DC

## 2021-01-16 MED ORDER — HEPARIN SODIUM (PORCINE) 1000 UNIT/ML IJ SOLN
INTRAMUSCULAR | Status: DC | PRN
  Administered 2021-01-16: 1000 [IU] via INTRAVENOUS
  Administered 2021-01-16: 15000 [IU] via INTRAVENOUS

## 2021-01-16 MED ORDER — HEPARIN SODIUM (PORCINE) 1000 UNIT/ML IJ SOLN
INTRAMUSCULAR | Status: DC | PRN
  Administered 2021-01-16: 2000 [IU] via INTRAVENOUS

## 2021-01-16 MED ORDER — ONDANSETRON HCL 4 MG/2ML IJ SOLN: 4.0000 mg | Freq: Four times a day (QID) | INTRAMUSCULAR | Status: DC | PRN

## 2021-01-16 MED ORDER — HYDROCODONE-ACETAMINOPHEN 5-325 MG PO TABS: 1.0000 | ORAL_TABLET | ORAL | Status: DC | PRN

## 2021-01-16 MED ORDER — SODIUM CHLORIDE 0.9 % IV SOLN: INTRAVENOUS | Status: DC

## 2021-01-16 MED ORDER — ONDANSETRON HCL 4 MG/2ML IJ SOLN
INTRAMUSCULAR | Status: DC | PRN
  Administered 2021-01-16: 4 mg via INTRAVENOUS

## 2021-01-16 MED ORDER — PHENYLEPHRINE HCL-NACL 20-0.9 MG/250ML-% IV SOLN
INTRAVENOUS | Status: DC | PRN
  Administered 2021-01-16: 35 ug/min via INTRAVENOUS

## 2021-01-16 MED ORDER — LABETALOL HCL 5 MG/ML IV SOLN
INTRAVENOUS | Status: DC | PRN
  Administered 2021-01-16: 5 mg via INTRAVENOUS

## 2021-01-16 MED ORDER — ROCURONIUM BROMIDE 10 MG/ML (PF) SYRINGE
PREFILLED_SYRINGE | INTRAVENOUS | Status: DC | PRN
  Administered 2021-01-16: 20 mg via INTRAVENOUS
  Administered 2021-01-16: 10 mg via INTRAVENOUS
  Administered 2021-01-16: 30 mg via INTRAVENOUS

## 2021-01-16 MED ORDER — HEPARIN SODIUM (PORCINE) 1000 UNIT/ML IJ SOLN
INTRAMUSCULAR | Status: AC
  Filled 2021-01-16: qty 1

## 2021-01-16 MED ORDER — ACETAMINOPHEN 160 MG/5ML PO SOLN
1000.0000 mg | Freq: Once | ORAL | Status: AC
  Administered 2021-01-16: 1000 mg via ORAL
  Filled 2021-01-16: qty 40.6

## 2021-01-16 MED ORDER — LIDOCAINE 2% (20 MG/ML) 5 ML SYRINGE
INTRAMUSCULAR | Status: DC | PRN
  Administered 2021-01-16: 40 mg via INTRAVENOUS

## 2021-01-16 MED ORDER — SUGAMMADEX SODIUM 200 MG/2ML IV SOLN
INTRAVENOUS | Status: DC | PRN
  Administered 2021-01-16: 20 mg via INTRAVENOUS
  Administered 2021-01-16: 150 mg via INTRAVENOUS

## 2021-01-16 MED ORDER — SODIUM CHLORIDE 0.9 % IV SOLN: 250.0000 mL | INTRAVENOUS | Status: DC | PRN

## 2021-01-16 SURGICAL SUPPLY — 17 items
CATH OCTARAY 2.0 F 3-3-3-3-3 (CATHETERS) ×1 IMPLANT
CATH SMTCH THERMOCOOL SF DF (CATHETERS) ×1 IMPLANT
CATH SOUNDSTAR ECO 8FR (CATHETERS) ×1 IMPLANT
CATH WEBSTER BI DIR CS D-F CRV (CATHETERS) ×1 IMPLANT
CLOSURE PERCLOSE PROSTYLE (VASCULAR PRODUCTS) ×4 IMPLANT
COVER SWIFTLINK CONNECTOR (BAG) ×2 IMPLANT
NDL BAYLIS TRANSSEPTAL 71CM (NEEDLE) IMPLANT
NEEDLE BAYLIS TRANSSEPTAL 71CM (NEEDLE) ×2 IMPLANT
PACK EP LATEX FREE (CUSTOM PROCEDURE TRAY) ×2
PACK EP LF (CUSTOM PROCEDURE TRAY) ×1 IMPLANT
PAD PRO RADIOLUCENT 2001M-C (PAD) ×2 IMPLANT
PATCH CARTO3 (PAD) ×1 IMPLANT
SHEATH BAYLIS TORFLEX (SHEATH) ×1 IMPLANT
SHEATH PINNACLE 7F 10CM (SHEATH) ×2 IMPLANT
SHEATH PINNACLE 9F 10CM (SHEATH) ×1 IMPLANT
SHEATH PROBE COVER 6X72 (BAG) ×1 IMPLANT
TUBING SMART ABLATE COOLFLOW (TUBING) ×1 IMPLANT

## 2021-01-16 NOTE — Anesthesia Procedure Notes (Signed)
Procedure Name: Intubation Date/Time: 01/16/2021 10:20 AM Performed by: Lavell Luster, CRNA Pre-anesthesia Checklist: Patient identified, Emergency Drugs available, Suction available, Patient being monitored and Timeout performed Patient Re-evaluated:Patient Re-evaluated prior to induction Oxygen Delivery Method: Circle system utilized Preoxygenation: Pre-oxygenation with 100% oxygen Induction Type: IV induction Ventilation: Mask ventilation without difficulty Laryngoscope Size: Mac and 3 Grade View: Grade I Tube type: Oral Tube size: 7.0 mm Number of attempts: 1 Airway Equipment and Method: Stylet Placement Confirmation: ETT inserted through vocal cords under direct vision, positive ETCO2 and breath sounds checked- equal and bilateral Secured at: 20 cm Tube secured with: Tape Dental Injury: Teeth and Oropharynx as per pre-operative assessment

## 2021-01-16 NOTE — Anesthesia Postprocedure Evaluation (Signed)
Anesthesia Post Note  Patient: Mackenzie [name]  Procedure(s) Performed: ATRIAL FIBRILLATION ABLATION     Patient location during evaluation: PACU Anesthesia Type: General Level of consciousness: awake and alert Pain management: pain level controlled Vital Signs Assessment: post-procedure vital signs reviewed and stable Respiratory status: spontaneous breathing, nonlabored ventilation, respiratory function stable and patient connected to nasal cannula oxygen Cardiovascular status: blood pressure returned to baseline and stable Postop Assessment: no apparent nausea or vomiting Anesthetic complications: no   No notable events documented.  Last Vitals:  Vitals:   01/16/21 1415 01/16/21 1419  BP:  (!) 147/52  Pulse: 62 67  Resp: 15 15  Temp:    SpO2: 97% 95%    Last Pain:  Vitals:   01/16/21 1321  TempSrc:   PainSc: 0-No pain                 Tiajuana Amass

## 2021-01-16 NOTE — Progress Notes (Signed)
Discharge instructions reviewed with pt and her husband. Both voice understanding.  

## 2021-01-16 NOTE — Anesthesia Preprocedure Evaluation (Signed)
Anesthesia Evaluation  Patient identified by MRN, date of birth, ID band Patient awake    Reviewed: Allergy & Precautions, NPO status , Patient's Chart, lab work & pertinent test results  Airway Mallampati: II  TM Distance: >3 FB Neck ROM: Full    Dental  (+) Dental Advisory Given   Pulmonary neg pulmonary ROS,    breath sounds clear to auscultation       Cardiovascular hypertension, Pt. on medications and Pt. on home beta blockers +CHF  + dysrhythmias Atrial Fibrillation  Rhythm:Regular Rate:Normal  7/22 Echo: EF 60-65%. Valves WNL    Neuro/Psych negative neurological ROS     GI/Hepatic Neg liver ROS, GERD  ,Esophageal stricture s/p dilations   Endo/Other  negative endocrine ROS  Renal/GU Renal InsufficiencyRenal disease     Musculoskeletal   Abdominal   Peds  Hematology negative hematology ROS (+)   Anesthesia Other Findings   Reproductive/Obstetrics                             Lab Results  Component Value Date   WBC 6.5 12/29/2020   HGB 11.7 12/29/2020   HCT 35.2 12/29/2020   MCV 90 12/29/2020   PLT 256 12/29/2020   Lab Results  Component Value Date   CREATININE 1.39 (H) 12/29/2020   BUN 24 12/29/2020   NA 139 12/29/2020   K 4.2 12/29/2020   CL 103 12/29/2020   CO2 24 12/29/2020    Anesthesia Physical Anesthesia Plan  ASA: 3  Anesthesia Plan: General   Post-op Pain Management:    Induction: Intravenous  PONV Risk Score and Plan: 3 and Dexamethasone, Ondansetron and Treatment may vary due to age or medical condition  Airway Management Planned: Oral ETT  Additional Equipment: None  Intra-op Plan:   Post-operative Plan: Extubation in OR  Informed Consent: I have reviewed the patients History and Physical, chart, labs and discussed the procedure including the risks, benefits and alternatives for the proposed anesthesia with the patient or authorized  representative who has indicated his/her understanding and acceptance.     Dental advisory given  Plan Discussed with: CRNA  Anesthesia Plan Comments:         Anesthesia Quick Evaluation

## 2021-01-16 NOTE — Discharge Instructions (Signed)

## 2021-01-16 NOTE — Progress Notes (Signed)
Ambulated to the bathroom to void and in the hallway tol well. No bleeding noted before or after ambulation.

## 2021-01-16 NOTE — Transfer of Care (Signed)
Immediate Anesthesia Transfer of Care Note  Patient: Mackenzie [name]  Procedure(s) Performed: ATRIAL FIBRILLATION ABLATION  Patient Location: Cath Lab  Anesthesia Type:General  Level of Consciousness: awake, alert  and sedated  Airway & Oxygen Therapy: Patient connected to nasal cannula oxygen  Post-op Assessment: Post -op Vital signs reviewed and stable  Post vital signs: stable  Last Vitals:  Vitals Value Taken Time  BP    Temp    Pulse 65 01/16/21 1238  Resp 18 01/16/21 1238  SpO2 100 % 01/16/21 1238  Vitals shown include unvalidated device data.  Last Pain:  Vitals:   01/16/21 0845  TempSrc:   PainSc: 0-No pain         Complications: No notable events documented.

## 2021-01-16 NOTE — H&P (Signed)
CC: afib   History of Present Illness: Mackenzie [name] is a 70 y.o. female who presents today for electrophysiology study and ablation for afib. She has had afib since 2016. She was maintained on amiodarone for several years but subsequently had additional afib.  I saw her 04/2018 and we discussed ablation.  She was scheduled but called back and cancelled the procedure.  She has not been back to see me since that time. Her afib continues to increase in frequency and duration. 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   Today, she denies symptoms of palpitations, chest pain, shortness of breath, orthopnea, PND, lower extremity edema, claudication, dizziness, presyncope, syncope, bleeding, or neurologic sequela. The patient is tolerating medications without difficulties and is otherwise without complaint today.          Past Medical History:  Diagnosis Date   Chronic systolic CHF (congestive heart failure) ([place])      a. 2D ECHO on 02/10/15 w/ severe LV dysfunction with EF 25-30%. Mild MR and mild LAE. negative nuclear stress test, felt to be due to tachycardia    Dyspnea      with exertion   Dysrhythmia      PAF   Esophageal stricture      a. requiring dilations. crushes pills to take PO   Full dentures     GERD (gastroesophageal reflux disease)     Osteoporosis      sees Dr. Carrolyn Meiers    PAF (paroxysmal atrial fibrillation) [facility])      a. s/p succesfful DCCV on 02/13/15   Pneumonia      (08/15/16)- many years ago   Sebaceous cyst      on back of neck, has seen Dr. Ronnald Collum         Past Surgical History:  Procedure Laterality Date   BALLOON DILATION N/A 06/22/2018    Procedure: [place];  Surgeon: Melida Quitter, MD;  Location: [place];  Service: ENT;  Laterality: N/A;   BREAST SURGERY Bilateral      Breast Implants   CARDIOVERSION N/A 02/13/2015    Procedure: CARDIOVERSION;  Surgeon:  Pixie Casino, MD;  Location: [place];  Service: Cardiovascular;  Laterality: N/A;   COLONOSCOPY   09/24/2002   ESOPHAGEAL DILATION N/A 08/19/2016    Procedure: ESOPHAGEAL DILATION;  Surgeon: Melida Quitter, MD;  Location: [place];  Service: ENT;  Laterality: N/A;  esophagoscopy with balloon dilation   ESOPHAGOGASTRODUODENOSCOPY   06/01/2003    12/13   ESOPHAGOGASTRODUODENOSCOPY (EGD) WITH ESOPHAGEAL DILATION       ESOPHAGOSCOPY WITH DILITATION N/A 06/16/2017    Procedure: ESOPHAGOSCOPY WITH BALLOON DILITATION;  Surgeon: Melida Quitter, MD;  Location: [place];  Service: ENT;  Laterality: N/A;   RIGID ESOPHAGOSCOPY N/A 01/18/2013    Procedure: RIGID ESOPHAGOSCOPY WITH ESPHAGEAL Balloon DILATION ;  Surgeon: Melida Quitter, MD;  Location: [place];  Service: ENT;  Laterality: N/A;   RIGID ESOPHAGOSCOPY N/A 06/22/2018    Procedure: RIGID ESOPHAGOSCOPY;  Surgeon: Melida Quitter, MD;  Location: [place];  Service: ENT;  Laterality: N/A;   TEE WITHOUT CARDIOVERSION N/A 02/13/2015    Procedure: TRANSESOPHAGEAL ECHOCARDIOGRAM (TEE);  Surgeon: Pixie Casino, MD;  Location: [facility] ENDOSCOPY;  Service: Cardiovascular;  Laterality: N/A;              Current Outpatient Medications  Medication Sig Dispense Refill  amiodarone (PACERONE) 200 MG tablet Take 0.5 tablets (100 mg total) by mouth daily. 45 tablet 3   chlorthalidone (HYGROTON) 25 MG tablet TAKE 1/2 TABLET BY MOUTH EVERY DAY 45 tablet 3   lansoprazole (PREVACID) 30 MG capsule TAKE 1 CAPSULE BY MOUTH EVERY DAY 90 capsule 3   losartan (COZAAR) 50 MG tablet Take 1 tablet (50 mg total) by mouth daily. 90 tablet 3   metoprolol tartrate (LOPRESSOR) 25 MG tablet Take 0.5 tablets (12.5 mg total) by mouth 2 (two) times daily. 90 tablet 3   Rivaroxaban (XARELTO) 15 MG TABS tablet TAKE 1 TABLET (15 MG TOTAL) BY MOUTH DAILY WITH SUPPER. NOTE DOSE CHANGE 90 tablet 3   temazepam (RESTORIL) 30 MG capsule Take 1 capsule  (30 mg total) by mouth at bedtime as needed. for sleep 90 capsule 1    No current facility-administered medications for this visit.      Allergies:   Ramipril    Social History:  The patient  reports that she has never smoked. She has never used smokeless tobacco. She reports that she does not drink alcohol and does not use drugs.    Family History:  The patient's family history includes Aneurysm in her father; Diabetes type II in her sister; Heart attack in her brother and brother; Heart attack (age of onset: 62) in her mother; Hypertension in her sister and sister.      ROS:  Please see the history of present illness.   All other systems are personally reviewed and negative.      PHYSICAL EXAM: Vitals:   01/16/21 0816  BP: (!) 205/86  Pulse: 64  Resp: 15  Temp: 97.7 F (36.5 C)  SpO2: 100%   GEN: Well nourished, well developed, in no acute distress HEENT: normal Neck: no JVD, carotid bruits, or masses Cardiac: RRR; no murmurs, rubs, or gallops,no edema  Respiratory:  clear to auscultation bilaterally, normal work of breathing GI: soft, nontender, nondistended, + BS MS: no deformity or atrophy Skin: warm and dry  Neuro:  Strength and sensation are intact Psych: euthymic mood, full affect       Lipid Panel  Labs (Brief)          Component Value Date/Time    CHOL 205 (H) 02/22/2019 1117    TRIG 91 02/22/2019 1117    HDL 77 02/22/2019 1117    CHOLHDL 2.7 02/22/2019 1117    CHOLHDL 3.0 02/10/2015 0126    VLDL 12 02/10/2015 0126    LDLCALC 112 (H) 02/22/2019 1117    LDLDIRECT 121.0 03/14/2010 0931      personally reviewed       Wt Readings from Last 3 Encounters:  10/04/20 104 lb (47.2 kg)  09/13/20 105 lb 6.4 oz (47.8 kg)  04/25/20 107 lb 12.8 oz (48.9 kg)        Other studies personally reviewed: Additional studies/ records that were reviewed today include: Dr Morrison Old team notes, my prior notes reviewed, prior echo and ekgs reviewed  Review of the above  records today demonstrates: as above     ASSESSMENT AND PLAN:   1.  Paroxysmal atrial fibrillation The patient has symptomatic, recurrent  atrial fibrillation. she has failed medical therapy with amiodarone.  Recent cardiac CT shows bibasilar scarring. she is anticoagulated with xarleto.    Risk, benefits, and alternatives to EP study and radiofrequency ablation for afib were again discussed in detail today. These risks include but are not limited to stroke, bleeding, vascular damage,  tamponade, perforation, damage to the esophagus, lungs, and other structures, pulmonary vein stenosis, worsening renal function, and death. The patient understands these risk and wishes to proceed.    Cardiac CT reviewed at length with the patient today.  she reports compliance with [place] without interruption.  CrCl is 27.  She is on the appropriate dose of xarelto.  Given scarring noted on CT, I will stop amiodarone today.  Thompson Grayer MD, [place] 01/16/2021 9:48 AM

## 2021-01-17 ENCOUNTER — Encounter (HOSPITAL_COMMUNITY): Payer: Self-pay | Admitting: Internal Medicine

## 2021-01-17 ENCOUNTER — Telehealth: Payer: Self-pay | Admitting: Physician Assistant

## 2021-01-17 NOTE — Telephone Encounter (Signed)
Patient had a EP study with ablation yesterday and was told to remove the bandage today.  After she removed the bandage, she started having mild oozing at the groin Site.  She did restart her Xarelto last night.  She took her Xarelto around 6 PM today as well.  The wheezing is quite mild.  Cath site is at the common femoral vein which is a low pressure system.  I instructed the patient to cover it with dressing and use light compression to hold pressure for about 10 to 15 minutes.

## 2021-01-18 ENCOUNTER — Telehealth: Payer: Self-pay | Admitting: Internal Medicine

## 2021-01-18 NOTE — Telephone Encounter (Signed)
New message   Pt said the place on her leg where she had her procedure is bleeding some and she has concerns. She would like to speak to RN.

## 2021-01-18 NOTE — Telephone Encounter (Signed)
Spoke with patient small amount of oozing noted on bandaid she placed last night. Denied bright red blood. Pt will keep covered another 24 hours. If oozing continues she will let our office know and will bring in for assessment.

## 2021-01-19 ENCOUNTER — Telehealth: Payer: Self-pay | Admitting: Internal Medicine

## 2021-01-19 NOTE — Telephone Encounter (Signed)
Spoke with pt regarding increased blood pressure and heart rate. Pt recently has an a-fib ablation on 01/16/21. Pt reports blood pressure of 173/101 and heart rate 102bpm. Pt was told to call Dr. Jackalyn Lombard office if she had an issues or concerns. Will advise pt to call back for on call provider and send message to Dr. Rayann Heman. While on the phone with pt another nurse from our office also called her, somehow we were disconnected.

## 2021-01-19 NOTE — Telephone Encounter (Signed)
  Pt c/o BP issue: STAT if pt c/o blurred vision, one-sided weakness or slurred speech  1. What are your last 5 BP readings? 171/101 HR 102  2. Are you having any other symptoms (ex. Dizziness, headache, blurred vision, passed out)?   3. What is your BP issue? Pt said her BP and HR is elevated and would like to speak with a nurse

## 2021-01-19 NOTE — Telephone Encounter (Signed)
Returned call to patient who was calling in regard to her blood pressure, and palpitations. Patient states that she had an ablation on Tuesday this week and recently stopped her Amiodarone as well. Patient states that today at 4:30 her blood pressure was 170/101 and HR was 102. Patient states that upon checking it again it was 148/107 with HR 101. Patient states that her watch read that she is back in Atrial Fib. Patient denies any chest pain, shortness of breath, dizziness, or lightheadedness. Patient states that overall she feels well. Patient states she is concerned about being back in A-fib and her blood pressure being elevated. Advised patient that she should hang up, call back to office and speak with after hours line to speak with the oncall provider. Advised patient I would also forward message to Dr. Rayann Heman to make him aware. Patient verbalized understanding and was grateful for call back.

## 2021-01-20 NOTE — Telephone Encounter (Signed)
Stacy,  Please follow-up with her.  Thanks!

## 2021-01-20 NOTE — Telephone Encounter (Signed)
Please follow-up with her by phone to make sure she is ok.

## 2021-01-22 NOTE — Telephone Encounter (Signed)
Patient states she settled before calling the on-call Friday therefore did nto call in. She was in and out of afib some this weekend. And feels she is out of rhythm now. Educated pt breakthrough afib post ablation is expected if persistent to let the office know. For breakthrough HRs over 100 she can take an extra 1/2 of metoprolol a day. Pt states her groin is normal now no more oozing from the area has been noted since the end of last week. She will call if issues arise prior to 1 month follow up.

## 2021-01-29 ENCOUNTER — Telehealth (HOSPITAL_COMMUNITY): Payer: Self-pay | Admitting: *Deleted

## 2021-01-29 NOTE — Telephone Encounter (Signed)
Patient called in stating she has been in AF since Saturday persistently. HR started in the 140s but are now in the 90s. Feels weak offered appt but pt wants to wait until tomorrow. Appt made.

## 2021-01-30 ENCOUNTER — Encounter (HOSPITAL_COMMUNITY): Payer: Self-pay | Admitting: Nurse Practitioner

## 2021-01-30 ENCOUNTER — Ambulatory Visit (HOSPITAL_COMMUNITY)
Admission: RE | Admit: 2021-01-30 | Discharge: 2021-01-30 | Disposition: A | Discharge status: Home / Self Care | Payer: BC Managed Care – PPO | Source: Ambulatory Visit | Attending: Nurse Practitioner | Admitting: Nurse Practitioner

## 2021-01-30 ENCOUNTER — Other Ambulatory Visit: Payer: Self-pay

## 2021-01-30 VITALS — BP 168/76 | HR 61 | Ht 61.0 in | Wt 105.4 lb

## 2021-01-30 DIAGNOSIS — D6869 Other thrombophilia: Secondary | ICD-10-CM | POA: Diagnosis not present

## 2021-01-30 DIAGNOSIS — Z8249 Family history of ischemic heart disease and other diseases of the circulatory system: Secondary | ICD-10-CM | POA: Insufficient documentation

## 2021-01-30 DIAGNOSIS — I110 Hypertensive heart disease with heart failure: Secondary | ICD-10-CM | POA: Insufficient documentation

## 2021-01-30 DIAGNOSIS — Z7901 Long term (current) use of anticoagulants: Secondary | ICD-10-CM | POA: Insufficient documentation

## 2021-01-30 DIAGNOSIS — I480 Paroxysmal atrial fibrillation: Secondary | ICD-10-CM | POA: Diagnosis not present

## 2021-01-30 DIAGNOSIS — I509 Heart failure, unspecified: Secondary | ICD-10-CM | POA: Insufficient documentation

## 2021-01-30 DIAGNOSIS — Z79899 Other long term (current) drug therapy: Secondary | ICD-10-CM | POA: Insufficient documentation

## 2021-01-30 MED ORDER — LOSARTAN POTASSIUM 100 MG PO TABS: 100.0000 mg | ORAL_TABLET | Freq: Every day | ORAL | 3 refills | Status: DC

## 2021-01-30 NOTE — Patient Instructions (Signed)
Increase losartan to 100mg once a day 

## 2021-01-30 NOTE — Progress Notes (Signed)
Primary Care Physician: Laurey Morale, MD Referring Physician: Dr. Rayann Heman Cardiologist: Dr. Martinique    Mackenzie [name] is a 70 y.o. female with a h/o afib s/p ablation 01/16/21. She was maintained for several years on amiodarone but afib became more persistent. 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 Latest CT scan showed bibasilar scarring. Amiodarone stopped 01/16/21.   She had ablation 01/16/21 and is in the afib clinic as she felt herself return to afib recently.   Ekg today shows SR. She feels that she went back into rhythm this am. Since the ablation  she has seen increase in BP. Her losartan and chlorthalidone had been reduced in the past as when she was in afib her BP would run very low. HTN now may be triggering afib. She has seen 892-119 systolic at home. BP is 168/76 at home.    Today, she denies symptoms of palpitations, chest pain, shortness of breath, orthopnea, PND, lower extremity edema, dizziness, presyncope, syncope, or neurologic sequela. The patient is tolerating medications without difficulties and is otherwise without complaint today.   Past Medical History:  Diagnosis Date   Chronic systolic CHF (congestive heart failure) ([place])    a. 2D ECHO on 02/10/15 w/ severe LV dysfunction with EF 25-30%. Mild MR and mild LAE. negative nuclear stress test, felt to be due to tachycardia    Dyspnea    with exertion   Dysrhythmia    PAF   Esophageal stricture    a. requiring dilations. crushes pills to take PO   Full dentures    GERD (gastroesophageal reflux disease)    Osteoporosis    sees Dr. Carrolyn Meiers    PAF (paroxysmal atrial fibrillation) [facility])    a. s/p succesfful DCCV on 02/13/15   Pneumonia    (08/15/16)- many years ago   Sebaceous cyst    on back of neck, has seen Dr. Ronnald Collum   Past Surgical History:  Procedure Laterality Date   ATRIAL FIBRILLATION ABLATION N/A 01/16/2021   Procedure: [place];  Surgeon: Thompson Grayer, MD;  Location: [place] [facility];  Service: Cardiovascular;  Laterality: N/A;   BALLOON DILATION N/A 06/22/2018   Procedure: ESOPHAGEAL BALLOON DILATION;  Surgeon: Melida Quitter, MD;  Location: [place];  Service: ENT;  Laterality: N/A;   BREAST SURGERY Bilateral    Breast Implants   CARDIOVERSION N/A 02/13/2015   Procedure: CARDIOVERSION;  Surgeon: Pixie Casino, MD;  Location: [place];  Service: Cardiovascular;  Laterality: N/A;   COLONOSCOPY  09/24/2002   ESOPHAGEAL DILATION N/A 08/19/2016   Procedure: ESOPHAGEAL DILATION;  Surgeon: Melida Quitter, MD;  Location: [place];  Service: ENT;  Laterality: N/A;  esophagoscopy with balloon dilation   ESOPHAGOGASTRODUODENOSCOPY  06/01/2003   12/13   ESOPHAGOGASTRODUODENOSCOPY (EGD) WITH ESOPHAGEAL DILATION     ESOPHAGOSCOPY WITH DILITATION N/A 06/16/2017   Procedure: ESOPHAGOSCOPY WITH BALLOON DILITATION;  Surgeon: Melida Quitter, MD;  Location: [place];  Service: ENT;  Laterality: N/A;   RIGID ESOPHAGOSCOPY N/A 01/18/2013   Procedure: RIGID ESOPHAGOSCOPY WITH ESPHAGEAL Balloon DILATION ;  Surgeon: Melida Quitter, MD;  Location: [place];  Service: ENT;  Laterality: N/A;   RIGID ESOPHAGOSCOPY N/A 06/22/2018   Procedure: RIGID ESOPHAGOSCOPY;  Surgeon: Melida Quitter, MD;  Location: [place];  Service: ENT;  Laterality: N/A;   TEE WITHOUT CARDIOVERSION N/A 02/13/2015   Procedure: TRANSESOPHAGEAL ECHOCARDIOGRAM (TEE);  Surgeon: Pixie Casino, MD;  Location: [facility];  Service: Cardiovascular;  Laterality: N/A;    Current Outpatient Medications  Medication Sig Dispense Refill   chlorthalidone (HYGROTON) 25 MG tablet Take 0.5 tablets (12.5 mg total) by mouth every other day. 15 tablet 5   lansoprazole (PREVACID) 30 MG capsule TAKE 1 CAPSULE BY MOUTH EVERY DAY (Patient taking differently: Take 30 mg by mouth daily at 12 noon.) 90 capsule 3   losartan  (COZAAR) 100 MG tablet TAKE 1 TABLET BY MOUTH EVERY DAY (Patient not taking: Reported on 01/09/2021) 90 tablet 3   losartan (COZAAR) 50 MG tablet Take 1 tablet (50 mg total) by mouth daily. 90 tablet 3   metoprolol tartrate (LOPRESSOR) 25 MG tablet TAKE 0.5 TABLETS BY MOUTH 2 TIMES DAILY. 90 tablet 3   Rivaroxaban (XARELTO) 15 MG TABS tablet TAKE 1 TABLET (15 MG TOTAL) BY MOUTH DAILY WITH SUPPER. NOTE DOSE CHANGE 90 tablet 1   temazepam (RESTORIL) 30 MG capsule Take 1 capsule (30 mg total) by mouth at bedtime as needed. for sleep (Patient taking differently: Take 10 mg by mouth at bedtime as needed for sleep.) 90 capsule 1   No current facility-administered medications for this encounter.    Allergies  Allergen Reactions   Ramipril Cough    Social History   Socioeconomic History   Marital status: Married    Spouse name: Not on file   Number of children: Not on file   Years of education: Not on file   Highest education level: Not on file  Occupational History   Not on file  Tobacco Use   Smoking status: Never   Smokeless tobacco: Never  Vaping Use   Vaping Use: Never used  Substance and Sexual Activity   Alcohol use: No    Alcohol/week: 0.0 standard drinks   Drug use: No   Sexual activity: Not on file  Other Topics Concern   Not on file  Social History Narrative   Not on file   Social Determinants of Health   Financial Resource Strain: Not on file  Food Insecurity: Not on file  Transportation Needs: Not on file  Physical Activity: Not on file  Stress: Not on file  Social Connections: Not on file  Intimate Partner Violence: Not on file    Family History  Problem Relation Age of Onset   Heart attack Mother 41       died from massive heart attack   Aneurysm Father        cerebral   Hypertension Sister    Diabetes type II Sister    Heart attack Brother    Hypertension Sister    Heart attack Brother    Stroke Neg Hx     ROS- All systems are reviewed and  negative except as per the HPI above  Physical Exam: There were no vitals filed for this visit. Wt Readings from Last 3 Encounters:  01/16/21 46.3 kg  10/04/20 47.2 kg  09/13/20 47.8 kg    Labs: Lab Results  Component Value Date   NA 139 12/29/2020   K 4.2 12/29/2020   CL 103 12/29/2020   CO2 24 12/29/2020   GLUCOSE 86 12/29/2020   BUN 24 12/29/2020   CREATININE 1.39 (H) 12/29/2020   CALCIUM 9.2 12/29/2020   MG 1.7 02/09/2015   Lab Results  Component Value Date   INR 1.00 08/19/2016   Lab Results  Component Value Date   CHOL 205 (H) 02/22/2019  HDL 77 02/22/2019   LDLCALC 112 (H) 02/22/2019   TRIG 91 02/22/2019     GEN- The patient is well appearing, alert and oriented x 3 today.   Head- normocephalic, atraumatic Eyes-  Sclera clear, conjunctiva pink Ears- hearing intact Oropharynx- clear Neck- supple, no JVP Lymph- no cervical lymphadenopathy Lungs- Clear to ausculation bilaterally, normal work of breathing Heart- Regular rate and rhythm, no murmurs, rubs or gallops, PMI not laterally displaced GI- soft, NT, ND, + BS Extremities- no clubbing, cyanosis, or edema MS- no significant deformity or atrophy Skin- no rash or lesion Psych- euthymic mood, full affect Neuro- strength and sensation are intact  EKG-NSR at 61 bpm, pr int 198 ms, qrs int 82 ms, qtc 436 ms     Assessment and Plan:  1. Paroxysmal afib S/p ablation 10/4 Amiodarone stopped 10/4 for lung scarring seen in the bases  on CT Pt has had afib with controlled v rates for a few days but has converted to SR as of this am  She can take an extra 1/2 tab of metoprolol if needed for afib episodes if BP sys is over 100 and HR is over 100 bpm  2. HTN Has been running  high at home This may be triggering afib I see a cardiology office note that decreased losartan to 50 mg a day for low BP's usually triggered by afib  Will increase this back to 100 mg daily  She will continue to monitor  at home and  I will review again on f/u 11/1  2. CHA2DS2VASc  score of at least 3 Continue xarelto 15 mg daily    F/u here 11/1  Butch Penny C. Mackenzie Lia, [facility] [address] [place], [place] [address] [phone]

## 2021-02-13 ENCOUNTER — Other Ambulatory Visit: Payer: Self-pay

## 2021-02-13 ENCOUNTER — Ambulatory Visit (HOSPITAL_COMMUNITY)
Admission: RE | Admit: 2021-02-13 | Discharge: 2021-02-13 | Disposition: A | Discharge status: Home / Self Care | Payer: BC Managed Care – PPO | Source: Ambulatory Visit | Attending: Nurse Practitioner | Admitting: Nurse Practitioner

## 2021-02-13 VITALS — BP 186/76 | HR 56 | Ht 61.0 in | Wt 101.8 lb

## 2021-02-13 DIAGNOSIS — I10 Essential (primary) hypertension: Secondary | ICD-10-CM | POA: Diagnosis not present

## 2021-02-13 DIAGNOSIS — Z79899 Other long term (current) drug therapy: Secondary | ICD-10-CM | POA: Diagnosis not present

## 2021-02-13 DIAGNOSIS — I4891 Unspecified atrial fibrillation: Secondary | ICD-10-CM | POA: Diagnosis not present

## 2021-02-13 MED ORDER — CHLORTHALIDONE 25 MG PO TABS: 12.5000 mg | ORAL_TABLET | Freq: Every day | ORAL | 3 refills | Status: DC

## 2021-02-13 NOTE — Progress Notes (Signed)
Pt in for BP check on increased losartan for elevated BP readings. Improved but not optimal. She had been on chlorthalidone 12.5 mg daily prior to low BP readings with afib, currently on 12.5 mg qod. . Now back in sinus rhythm,  BP's are running higher. BP today on presentation 186/76, recheck 160/90.  Go back to chlorthalidone, 12.5 mg daily with losartan at 100 mg daily. EKG today shows Sinus brady at 56 bpm. She still feels some irregular heart beat at times but nothing sustained. I will see back to review home BP readings in 1-2 weeks with bmet.

## 2021-02-13 NOTE — Patient Instructions (Signed)
Increase chlorthalidone to 1/2 tablet once a day

## 2021-02-22 ENCOUNTER — Other Ambulatory Visit: Payer: Self-pay

## 2021-02-22 ENCOUNTER — Ambulatory Visit (HOSPITAL_COMMUNITY)
Admission: RE | Admit: 2021-02-22 | Discharge: 2021-02-22 | Disposition: A | Discharge status: Home / Self Care | Payer: BC Managed Care – PPO | Source: Ambulatory Visit | Attending: Nurse Practitioner | Admitting: Nurse Practitioner

## 2021-02-22 DIAGNOSIS — R0989 Other specified symptoms and signs involving the circulatory and respiratory systems: Secondary | ICD-10-CM

## 2021-02-22 DIAGNOSIS — R030 Elevated blood-pressure reading, without diagnosis of hypertension: Secondary | ICD-10-CM | POA: Diagnosis not present

## 2021-02-22 LAB — BASIC METABOLIC PANEL
Anion gap: 8 (ref 5–15)
BUN: 22 mg/dL (ref 8–23)
CO2: 26 mmol/L (ref 22–32)
Calcium: 9.4 mg/dL (ref 8.9–10.3)
Chloride: 106 mmol/L (ref 98–111)
Creatinine, Ser: 1.4 mg/dL — ABNORMAL HIGH (ref 0.44–1.00)
GFR, Estimated: 40 mL/min — ABNORMAL LOW (ref >60)
Glucose, Bld: 99 mg/dL (ref 70–99)
Potassium: 3.8 mmol/L (ref 3.5–5.1)
Sodium: 140 mmol/L (ref 135–145)

## 2021-02-22 NOTE — Progress Notes (Signed)
Reviewed BP readings from home. Appears to better controlled with losartan 100 mg daily and chlorthalidone 1/2 tab daily. Bmet drawn, BP 140/82 here today. F/u with Dr. Rayann Heman in January as scheduled.

## 2021-03-02 ENCOUNTER — Ambulatory Visit: Payer: BC Managed Care – PPO | Admitting: Internal Medicine

## 2021-03-07 NOTE — Progress Notes (Signed)
Cardiology Office Note   Date:  03/14/2021   ID:  Mackenzie, [name] 1951-02-19, MRN [id number]  PCP:  Laurey Morale, MD  Cardiologist:  Mackenzie Yakubov Martinique, MD EP: Mackenzie Grayer, MD  Chief Complaint  Patient presents with   Atrial Fibrillation       History of Present Illness: Mackenzie [name] is a 70 y.o. 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  She was evaluated by me on 02/24/2020 at which time she was doing okay from a cardiology standpoint.  She reported episodes of atrial fibrillation approximately once a month which would last for up to 3 days.  She had a particularly bad episode just prior to her visit with associated nausea, indigestion, and palpitations; felt very weak.  We discussed consideration for an ablation, however patient preferred watchful waiting and if more severe episodes would consider at that time.  Atrial fibrillation history dates back to 2016 when she was diagnosed after presenting with shortness of breath and palpitations.  Echo at that time showed EF 25 to 30%.  Nuclear stress test at that time showed no ischemia.  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  She underwent TEE/DCCV with successful restoration of NSR, however had recurrent A. fib within 1 month.  She was started on amiodarone at that time.  She saw Dr. Rayann [name] with electrophysiology in 2020 for consideration of an ablation though did not go forward with scheduled procedure. She had subsequent recovery in her EF with sinus rhythm. Latest CT scan showed bibasilar scarring. She was later seen by Dr Rayann [name] with recurrent Afib despite amiodarone. She had ablation 01/16/21. Amiodarone was later discontinued. Was seen last month and doing well except BP was elevated and medications adjusted. She has not had a sleep study yet although does snore a lot and has poor sleep.  She  states since ablation she has a couple of episodes of Afib but not as symptomatic. No chest pain or dyspnea.      Past Medical History:  Diagnosis Date   Chronic systolic CHF (congestive heart failure) ([place])    a. 2D ECHO on 02/10/15 w/ severe LV dysfunction with EF 25-30%. Mild MR and mild LAE. negative nuclear stress test, felt to be due to tachycardia    Dyspnea    with exertion   Dysrhythmia    PAF   Esophageal stricture    a. requiring dilations. crushes pills to take PO   Full dentures    GERD (gastroesophageal reflux disease)    Osteoporosis    sees Dr. Carrolyn Meiers    PAF (paroxysmal atrial fibrillation) [facility])    a. s/p succesfful DCCV on 02/13/15   Pneumonia    (08/15/16)- many years ago   Sebaceous cyst    on back of neck, has seen Dr. Ronnald Collum    Past Surgical History:  Procedure Laterality Date   ATRIAL FIBRILLATION ABLATION N/A 01/16/2021   Procedure: [place];  Surgeon: Mackenzie Grayer, MD;  Location: [place] [facility];  Service: Cardiovascular;  Laterality: N/A;   BALLOON DILATION N/A 06/22/2018   Procedure: ESOPHAGEAL BALLOON DILATION;  Surgeon: Mackenzie Quitter, MD;  Location: [place];  Service: ENT;  Laterality: N/A;   BREAST SURGERY Bilateral    Breast Implants   CARDIOVERSION N/A 02/13/2015   Procedure: CARDIOVERSION;  Surgeon: Pixie Casino, MD;  Location:  [place] ENDOSCOPY;  Service: Cardiovascular;  Laterality: N/A;   COLONOSCOPY  09/24/2002   ESOPHAGEAL DILATION N/A 08/19/2016   Procedure: ESOPHAGEAL DILATION;  Surgeon: Mackenzie Quitter, MD;  Location: [place];  Service: ENT;  Laterality: N/A;  esophagoscopy with balloon dilation   ESOPHAGOGASTRODUODENOSCOPY  06/01/2003   12/13   ESOPHAGOGASTRODUODENOSCOPY (EGD) WITH ESOPHAGEAL DILATION     ESOPHAGOSCOPY WITH DILITATION N/A 06/16/2017   Procedure: ESOPHAGOSCOPY WITH BALLOON DILITATION;  Surgeon: Mackenzie Quitter, MD;  Location: [place];  Service: ENT;  Laterality:  N/A;   RIGID ESOPHAGOSCOPY N/A 01/18/2013   Procedure: RIGID ESOPHAGOSCOPY WITH ESPHAGEAL Balloon DILATION ;  Surgeon: Mackenzie Quitter, MD;  Location: [place];  Service: ENT;  Laterality: N/A;   RIGID ESOPHAGOSCOPY N/A 06/22/2018   Procedure: RIGID ESOPHAGOSCOPY;  Surgeon: Mackenzie Quitter, MD;  Location: [place];  Service: ENT;  Laterality: N/A;   TEE WITHOUT CARDIOVERSION N/A 02/13/2015   Procedure: TRANSESOPHAGEAL ECHOCARDIOGRAM (TEE);  Surgeon: Pixie Casino, MD;  Location: [facility];  Service: Cardiovascular;  Laterality: N/A;     Current Outpatient Medications  Medication Sig Dispense Refill   chlorthalidone (HYGROTON) 25 MG tablet Take 0.5 tablets (12.5 mg total) by mouth daily. 30 tablet 3   lansoprazole (PREVACID) 30 MG capsule TAKE 1 CAPSULE BY MOUTH EVERY DAY 90 capsule 3   losartan (COZAAR) 100 MG tablet Take 1 tablet (100 mg total) by mouth daily. 90 tablet 3   metoprolol tartrate (LOPRESSOR) 25 MG tablet TAKE 0.5 TABLETS BY MOUTH 2 TIMES DAILY. 90 tablet 3   Rivaroxaban (XARELTO) 15 MG TABS tablet TAKE 1 TABLET (15 MG TOTAL) BY MOUTH DAILY WITH SUPPER. NOTE DOSE CHANGE 90 tablet 1   temazepam (RESTORIL) 30 MG capsule Take 1 capsule (30 mg total) by mouth at bedtime as needed. for sleep (Patient taking differently: Take 10 mg by mouth at bedtime as needed for sleep.) 90 capsule 1   No current facility-administered medications for this visit.    Allergies:   Ramipril    Social History:  The patient  reports that she has never smoked. She has never used smokeless tobacco. She reports that she does not drink alcohol and does not use drugs.   Family History:  The patient's family history includes Aneurysm in her father; Diabetes type II in her sister; Heart attack in her brother and brother; Heart attack (age of onset: 1) in her mother; Hypertension in her sister and sister.    ROS:  Please see the history of present illness.   Otherwise, review  of systems are positive for none.   All other systems are reviewed and negative.    PHYSICAL EXAM: VS:  BP (!) 148/74   Pulse (!) 57   Ht 5\' 1"  (1.549 m)   Wt 101 lb (45.8 kg)   SpO2 99%   BMI 19.08 kg/m  , BMI Body mass index is 19.08 kg/m. GEN: Well nourished, well developed, in no acute distress HEENT: sclera anicteric Neck: no JVD, carotid bruits, or masses Cardiac: RRR; no murmurs, rubs, or gallops, no edema  Respiratory:  clear to auscultation bilaterally, normal work of breathing GI: soft, nontender, nondistended, + BS MS: no deformity or atrophy Skin: warm and dry, no rash Neuro:  Strength and sensation are intact Psych: euthymic mood, full affect   EKG:  EKG is not ordered today.    Recent Labs: 12/29/2020: Hemoglobin 11.7; Platelets 256 02/22/2021: BUN 22; Creatinine, Ser 1.40; Potassium 3.8; Sodium 140  Lipid Panel    Component Value Date/Time   CHOL 205 (H) 02/22/2019 1117   TRIG 91 02/22/2019 1117   HDL 77 02/22/2019 1117   CHOLHDL 2.7 02/22/2019 1117   CHOLHDL 3.0 02/10/2015 0126   VLDL 12 02/10/2015 0126   LDLCALC 112 (H) 02/22/2019 1117   LDLDIRECT 121.0 03/14/2010 0931      Wt Readings from Last 3 Encounters:  03/14/21 101 lb (45.8 kg)  02/13/21 101 lb 12.8 oz (46.2 kg)  01/30/21 105 lb 6.4 oz (47.8 kg)      Other studies Reviewed: Additional studies/ records that were reviewed today include:   Echocardiogram 2019: Study Conclusions   - Left ventricle: The cavity size was normal. Wall thickness was    normal. Systolic function was normal. The estimated ejection    fraction was in the range of 55% to 60%. Wall motion was normal;    there were no regional wall motion abnormalities. Doppler    parameters are consistent with abnormal left ventricular    relaxation (grade 2 diastolic dysfunction). The E/e&' ratio is    >15, suggesting elevated LV filling pressure.  - Mitral valve: Mildly thickened leaflets . There was trivial     regurgitation.  - Left atrium: The atrium was normal in size.  - Tricuspid valve: There was mild regurgitation.  - Pulmonary arteries: PA peak pressure: 34 mm Hg (S).  - Inferior vena cava: The vessel was normal in size. The    respirophasic diameter changes were in the normal range (>= 50%),    consistent with normal central venous pressure.   Impressions:   - Compared to a prior study in 2017, there are no significant    changes.   NST 2016: 1. No reversible ischemia or infarction. Small, mild fixed defect along the anterior apex.   2. Global hypokinesis.   3.  Decreased left ventricular ejection fraction of 21%.   4. High-risk stress test findings*.  Echo 10/27/20: IMPRESSIONS     1. Left ventricular ejection fraction, by estimation, is 60 to 65%. The  left ventricle has normal function. The left ventricle has no regional  wall motion abnormalities. There is mild asymmetric left ventricular  hypertrophy. Left ventricular diastolic  parameters are indeterminate. Elevated left ventricular end-diastolic  pressure.   2. Right ventricular systolic function is normal. The right ventricular  size is normal.   3. The mitral valve is normal in structure. Trivial mitral valve  regurgitation.   4. The aortic valve is normal in structure. Aortic valve regurgitation is  trivial.    ASSESSMENT AND PLAN: - S/p Afib ablation in early October. - reports some recurrence but improved.  -Continue Xarelto for stroke ppx.   - Continue metoprolol for rate control - off amiodarone. - follow up with EP in Jan.  2.  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  She has stable chronic dyspnea on exertion.  She appears euvolemic on exam. - Continue chlorthalidone, losartan, and metoprolol  3.  Labile hypertension:  - continue losartan and metoprolol - BP improved since she is taking chlorthalidone daily. - Patient instructed to  continue to monitor BP closely  4.  Insomnia/daytime somnolence/snoring: Longstanding issue with poor sleep quality and snoring - We will check a home sleep study   Current medicines are reviewed at length with the patient today.  The patient does not have concerns regarding medicines.  The following changes have been  made:  As above  Labs/ tests ordered today include:   Orders Placed This Encounter  Procedures   Home sleep test      Disposition:   FU with Dr. Martinique in 6 months  Signed, Trevonte Ashkar Martinique, MD  03/14/2021 9:52 AM

## 2021-03-14 ENCOUNTER — Other Ambulatory Visit: Payer: Self-pay

## 2021-03-14 ENCOUNTER — Encounter: Payer: Self-pay | Admitting: Cardiology

## 2021-03-14 ENCOUNTER — Ambulatory Visit (INDEPENDENT_AMBULATORY_CARE_PROVIDER_SITE_OTHER): Payer: BC Managed Care – PPO | Admitting: Cardiology

## 2021-03-14 VITALS — BP 148/74 | HR 57 | Ht 61.0 in | Wt 101.0 lb

## 2021-03-14 DIAGNOSIS — I10 Essential (primary) hypertension: Secondary | ICD-10-CM

## 2021-03-14 DIAGNOSIS — G4700 Insomnia, unspecified: Secondary | ICD-10-CM

## 2021-03-14 DIAGNOSIS — R0683 Snoring: Secondary | ICD-10-CM | POA: Diagnosis not present

## 2021-03-14 DIAGNOSIS — I5022 Chronic systolic (congestive) heart failure: Secondary | ICD-10-CM

## 2021-03-14 DIAGNOSIS — I480 Paroxysmal atrial fibrillation: Secondary | ICD-10-CM | POA: Diagnosis not present

## 2021-03-14 NOTE — Patient Instructions (Signed)
Medication Instructions:  Continue same medications *If you need a refill on your cardiac medications before your next appointment, please call your pharmacy*   Lab Work: None ordered   Testing/Procedures: Schedule Home Sleep Study   Follow-Up: At [facility], you and your health needs are our priority.  As part of our continuing mission to provide you with exceptional heart care, we have created designated Provider Care Teams.  These Care Teams include your primary Cardiologist (physician) and Advanced Practice Providers (APPs -  Physician Assistants and Nurse Practitioners) who all work together to provide you with the care you need, when you need it.  We recommend signing up for the patient portal called "MyChart".  Sign up information is provided on this After Visit Summary.  MyChart is used to connect with patients for Virtual Visits (Telemedicine).  Patients are able to view lab/test results, encounter notes, upcoming appointments, etc.  Non-urgent messages can be sent to your provider as well.   To learn more about what you can do with MyChart, go to [email or web address].      Your next appointment:  6 months   Call in Feb to schedule May appointment    The format for your next appointment: Office   Provider: Dr.Jordan

## 2021-03-16 ENCOUNTER — Telehealth: Payer: Self-pay | Admitting: *Deleted

## 2021-03-16 NOTE — Telephone Encounter (Signed)
Left HST appointment details on voicemail.

## 2021-03-16 NOTE — Telephone Encounter (Signed)
-----   Message from Philbert Riser sent at 03/14/2021 10:00 AM EST ----- Regarding: REFERRAL Per Dr.Jordan, Home sleep study.   Thanks

## 2021-04-11 ENCOUNTER — Encounter (HOSPITAL_BASED_OUTPATIENT_CLINIC_OR_DEPARTMENT_OTHER): Payer: BC Managed Care – PPO | Admitting: Cardiovascular Disease

## 2021-04-19 ENCOUNTER — Ambulatory Visit: Payer: BC Managed Care – PPO | Admitting: Internal Medicine

## 2021-04-19 ENCOUNTER — Encounter: Payer: Self-pay | Admitting: Internal Medicine

## 2021-04-19 ENCOUNTER — Other Ambulatory Visit: Payer: Self-pay

## 2021-04-19 VITALS — BP 142/74 | HR 63 | Ht 61.0 in | Wt 100.0 lb

## 2021-04-19 DIAGNOSIS — I10 Essential (primary) hypertension: Secondary | ICD-10-CM

## 2021-04-19 DIAGNOSIS — I43 Cardiomyopathy in diseases classified elsewhere: Secondary | ICD-10-CM

## 2021-04-19 DIAGNOSIS — I480 Paroxysmal atrial fibrillation: Secondary | ICD-10-CM

## 2021-04-19 DIAGNOSIS — R000 Tachycardia, unspecified: Secondary | ICD-10-CM | POA: Diagnosis not present

## 2021-04-19 NOTE — Patient Instructions (Addendum)
Medication Instructions:  Your physician recommends that you continue on your current medications as directed. Please refer to the Current Medication list given to you today. *If you need a refill on your cardiac medications before your next appointment, please call your pharmacy*  Lab Work: None. If you have labs (blood work) drawn today and your tests are completely normal, you will receive your results only by: [place] (if you have MyChart) OR A paper copy in the mail If you have any lab test that is abnormal or we need to change your treatment, we will call you to review the results.  Testing/Procedures: None.  Follow-Up: At [facility], you and your health needs are our priority.  As part of our continuing mission to provide you with exceptional heart care, we have created designated Provider Care Teams.  These Care Teams include your primary Cardiologist (physician) and Advanced Practice Providers (APPs -  Physician Assistants and Nurse Practitioners) who all work together to provide you with the care you need, when you need it.  Your physician wants you to follow-up in: 3 months in the Afib Clinic. They will call you to schedule.   We recommend signing up for the patient portal called "MyChart".  Sign up information is provided on this After Visit Summary.  MyChart is used to connect with patients for Virtual Visits (Telemedicine).  Patients are able to view lab/test results, encounter notes, upcoming appointments, etc.  Non-urgent messages can be sent to your provider as well.   To learn more about what you can do with MyChart, go to [email or web address].    Any Other Special Instructions Will Be Listed Below (If Applicable).

## 2021-04-19 NOTE — Progress Notes (Signed)
PCP: Laurey Morale, MD Primary Cardiologist: Dr Martinique  Izumi Mackenzie Pingleton is a 71 y.o. female who presents today for routine electrophysiology followup.  Since his recent afib ablation, the patient reports doing very well.  she denies procedure related complications and is pleased with the results of the procedure.  + some palpitations but much improved post ablation. Today, she denies symptoms of chest pain, shortness of breath,  lower extremity edema, dizziness, presyncope, or syncope.  The patient is otherwise without complaint today.   Past Medical History:  Diagnosis Date   Chronic systolic CHF (congestive heart failure) ([place])    a. 2D ECHO on 02/10/15 w/ severe LV dysfunction with EF 25-30%. Mild MR and mild LAE. negative nuclear stress test, felt to be due to tachycardia    Dyspnea    with exertion   Dysrhythmia    PAF   Esophageal stricture    a. requiring dilations. crushes pills to take PO   Full dentures    GERD (gastroesophageal reflux disease)    Osteoporosis    sees Dr. Carrolyn Meiers    PAF (paroxysmal atrial fibrillation) [facility])    a. s/p succesfful DCCV on 02/13/15   Pneumonia    (08/15/16)- many years ago   Sebaceous cyst    on back of neck, has seen Dr. Ronnald Collum   Past Surgical History:  Procedure Laterality Date   ATRIAL FIBRILLATION ABLATION N/A 01/16/2021   Procedure: [place];  Surgeon: Thompson Grayer, MD;  Location: [place] CV LAB;  Service: Cardiovascular;  Laterality: N/A;   BALLOON DILATION N/A 06/22/2018   Procedure: ESOPHAGEAL BALLOON DILATION;  Surgeon: Melida Quitter, MD;  Location: [place];  Service: ENT;  Laterality: N/A;   BREAST SURGERY Bilateral    Breast Implants   CARDIOVERSION N/A 02/13/2015   Procedure: CARDIOVERSION;  Surgeon: Pixie Casino, MD;  Location: [place];  Service: Cardiovascular;  Laterality: N/A;   COLONOSCOPY  09/24/2002   ESOPHAGEAL DILATION N/A 08/19/2016   Procedure: ESOPHAGEAL  DILATION;  Surgeon: Melida Quitter, MD;  Location: [place];  Service: ENT;  Laterality: N/A;  esophagoscopy with balloon dilation   ESOPHAGOGASTRODUODENOSCOPY  06/01/2003   12/13   ESOPHAGOGASTRODUODENOSCOPY (EGD) WITH ESOPHAGEAL DILATION     ESOPHAGOSCOPY WITH DILITATION N/A 06/16/2017   Procedure: ESOPHAGOSCOPY WITH BALLOON DILITATION;  Surgeon: Melida Quitter, MD;  Location: [place];  Service: ENT;  Laterality: N/A;   RIGID ESOPHAGOSCOPY N/A 01/18/2013   Procedure: RIGID ESOPHAGOSCOPY WITH ESPHAGEAL Balloon DILATION ;  Surgeon: Melida Quitter, MD;  Location: [place];  Service: ENT;  Laterality: N/A;   RIGID ESOPHAGOSCOPY N/A 06/22/2018   Procedure: RIGID ESOPHAGOSCOPY;  Surgeon: Melida Quitter, MD;  Location: [place];  Service: ENT;  Laterality: N/A;   TEE WITHOUT CARDIOVERSION N/A 02/13/2015   Procedure: TRANSESOPHAGEAL ECHOCARDIOGRAM (TEE);  Surgeon: Pixie Casino, MD;  Location: [facility] ENDOSCOPY;  Service: Cardiovascular;  Laterality: N/A;    ROS- all systems are personally reviewed and negatives except as per HPI above  Current Outpatient Medications  Medication Sig Dispense Refill   chlorthalidone (HYGROTON) 25 MG tablet Take 0.5 tablets (12.5 mg total) by mouth daily. 30 tablet 3   lansoprazole (PREVACID) 30 MG capsule TAKE 1 CAPSULE BY MOUTH EVERY DAY 90 capsule 3   losartan (COZAAR) 100 MG tablet Take 1 tablet (100 mg total) by mouth daily. 90 tablet 3   metoprolol tartrate (LOPRESSOR) 25 MG tablet TAKE 0.5 TABLETS  BY MOUTH 2 TIMES DAILY. 90 tablet 3   Rivaroxaban (XARELTO) 15 MG TABS tablet TAKE 1 TABLET (15 MG TOTAL) BY MOUTH DAILY WITH SUPPER. NOTE DOSE CHANGE 90 tablet 1   temazepam (RESTORIL) 30 MG capsule Take 1 capsule (30 mg total) by mouth at bedtime as needed. for sleep (Patient taking differently: Take 10 mg by mouth at bedtime as needed for sleep.) 90 capsule 1   No current facility-administered medications for this visit.     Physical Exam: Vitals:   04/19/21 1422  BP: (!) 142/74  Pulse: 63  SpO2: 98%  Weight: 100 lb (45.4 kg)  Height: 5\' 1"  (1.549 m)    GEN- The patient is well appearing, alert and oriented x 3 today.   Head- normocephalic, atraumatic Eyes-  Sclera clear, conjunctiva pink Ears- hearing intact Oropharynx- clear Lungs- Clear to ausculation bilaterally, normal work of breathing Heart- Regular rate and rhythm, no murmurs, rubs or gallops, PMI not laterally displaced GI- soft, NT, ND, + BS Extremities- no clubbing, cyanosis, or edema  EKG tracing ordered today is personally reviewed and shows sinus  Assessment and Plan:  1. Paroxysmal atrial fibrillation Doing well s/p ablation Occasional palpitations are noted.  Clinically improved. chads2vasc score is 3.  She is on xarelto  2. HTN Stable No change required today  3. Nonischemic CM Hopefully EF will improve Repeat echo in 3-4 months  Return to see me in 3 months  Thompson Grayer MD, [facility] 04/19/2021 2:49 PM

## 2021-04-25 ENCOUNTER — Other Ambulatory Visit: Payer: Self-pay | Admitting: Cardiology

## 2021-04-25 ENCOUNTER — Other Ambulatory Visit: Payer: Self-pay | Admitting: Family Medicine

## 2021-05-17 ENCOUNTER — Other Ambulatory Visit: Payer: Self-pay | Admitting: Internal Medicine

## 2021-05-17 NOTE — Telephone Encounter (Signed)
Pt last saw Dr Rayann Heman 04/19/21, last labs 02/22/21 Creat 1.40, age 71, weight 45.4kg, CrCl 26.42, based on CrCl pt is on appropriate dosage of Xarelto 15mg  QD for afib.  Will refill rx.

## 2021-05-24 ENCOUNTER — Other Ambulatory Visit: Payer: Self-pay | Admitting: Family Medicine

## 2021-05-24 DIAGNOSIS — K219 Gastro-esophageal reflux disease without esophagitis: Secondary | ICD-10-CM

## 2021-05-28 ENCOUNTER — Telehealth: Payer: Self-pay | Admitting: Internal Medicine

## 2021-05-28 NOTE — Telephone Encounter (Signed)
Appt made

## 2021-05-28 NOTE — Telephone Encounter (Signed)
Patient c/o Palpitations:  High priority if patient c/o lightheadedness, shortness of breath, or chest pain  How long have you had palpitations/irregular HR/ Afib? Are you having the symptoms now? Been in Afib since last Tuesday- was in Afib at 7:30   Are you currently experiencing lightheadedness, SOB or CP? Lightheaded, just drains her, no energy  Do you have a history of afib (atrial fibrillation) or irregular heart rhythm? yes  Have you checked your BP or HR? (document readings if available):  goes up and down  Are you experiencing any other symptoms?

## 2021-05-28 NOTE — Telephone Encounter (Signed)
Pt reports that she has been in Afib since seeing Dr. Rayann Heman at the beginning of the year, not entire time but mostly in AFib. She states she has been completely out of rhytym since last Tuesday. Symptoms of blurry vision, heart flutters/flops , DOE and exhausted. Denies CP, edema or wt gain.  Current BP 100/87, HR 104 States she has not called sooner because at last OV Dr. Rayann Heman told her to "give it 6 months".  Pt aware I am forwarding call to the AFib clinic to see about getting her an appointment with them this week to discuss treatment plan. Patient verbalized understanding and agreeable to plan.

## 2021-05-29 ENCOUNTER — Encounter (HOSPITAL_COMMUNITY): Payer: Self-pay | Admitting: Nurse Practitioner

## 2021-05-29 ENCOUNTER — Other Ambulatory Visit: Payer: Self-pay

## 2021-05-29 ENCOUNTER — Ambulatory Visit (HOSPITAL_COMMUNITY)
Admission: RE | Admit: 2021-05-29 | Discharge: 2021-05-29 | Disposition: A | Discharge status: Home / Self Care | Payer: BC Managed Care – PPO | Source: Ambulatory Visit | Attending: Nurse Practitioner | Admitting: Nurse Practitioner

## 2021-05-29 VITALS — BP 144/84 | HR 92 | Ht 61.0 in | Wt 101.8 lb

## 2021-05-29 DIAGNOSIS — I4819 Other persistent atrial fibrillation: Secondary | ICD-10-CM | POA: Diagnosis not present

## 2021-05-29 DIAGNOSIS — I428 Other cardiomyopathies: Secondary | ICD-10-CM | POA: Insufficient documentation

## 2021-05-29 DIAGNOSIS — R000 Tachycardia, unspecified: Secondary | ICD-10-CM | POA: Diagnosis not present

## 2021-05-29 DIAGNOSIS — Z7901 Long term (current) use of anticoagulants: Secondary | ICD-10-CM | POA: Diagnosis not present

## 2021-05-29 DIAGNOSIS — I110 Hypertensive heart disease with heart failure: Secondary | ICD-10-CM | POA: Diagnosis not present

## 2021-05-29 DIAGNOSIS — I5022 Chronic systolic (congestive) heart failure: Secondary | ICD-10-CM | POA: Insufficient documentation

## 2021-05-29 NOTE — Progress Notes (Signed)
Primary Care Physician: Laurey Morale, MD Referring Physician: Dr. Rayann Heman Cardiologist: Dr. Martinique    Mackenzie [name] is a 71 y.o. female with a h/o afib s/p ablation 01/16/21. She was maintained for several years on amiodarone but afib became more persistent. 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 Latest CT scan showed bibasilar scarring. Amiodarone stopped 01/16/21.   She had ablation 01/16/21 and is in the afib clinic as she felt herself return to afib recently.   Ekg today shows SR. She feels that she went back into rhythm this am. Since the ablation  she has seen increase in BP. Her losartan and chlorthalidone had been reduced in the past as when she was in afib her BP would run very low. HTN now may be triggering afib. She has seen 175-102 systolic at home. BP is 168/76 at home.   F/u in afib clinic, 05/29/21 for increased afib burden. When she saw Dr. Rayann Heman in January was describing more afib episodes but has been persistent for several weeks now. No change in health. EKG shows afib at 92 bpm. No missed anticoagulation. Amiodarone was stopped last October at time of ablation.   Today, she denies symptoms of palpitations, chest pain, shortness of breath, orthopnea, PND, lower extremity edema, dizziness, presyncope, syncope, or neurologic sequela. The patient is tolerating medications without difficulties and is otherwise without complaint today.   Past Medical History:  Diagnosis Date   Chronic systolic CHF (congestive heart failure) ([place])    a. 2D ECHO on 02/10/15 w/ severe LV dysfunction with EF 25-30%. Mild MR and mild LAE. negative nuclear stress test, felt to be due to tachycardia    Dyspnea    with exertion   Dysrhythmia    PAF   Esophageal stricture    a. requiring dilations. crushes pills to take PO   Full dentures    GERD (gastroesophageal reflux disease)    Osteoporosis    sees Dr. Carrolyn Meiers    PAF (paroxysmal  atrial fibrillation) [facility])    a. s/p succesfful DCCV on 02/13/15   Pneumonia    (08/15/16)- many years ago   Sebaceous cyst    on back of neck, has seen Dr. Ronnald Collum   Past Surgical History:  Procedure Laterality Date   ATRIAL FIBRILLATION ABLATION N/A 01/16/2021   Procedure: [place];  Surgeon: Thompson Grayer, MD;  Location: [place] CV LAB;  Service: Cardiovascular;  Laterality: N/A;   BALLOON DILATION N/A 06/22/2018   Procedure: ESOPHAGEAL BALLOON DILATION;  Surgeon: Melida Quitter, MD;  Location: [place];  Service: ENT;  Laterality: N/A;   BREAST SURGERY Bilateral    Breast Implants   CARDIOVERSION N/A 02/13/2015   Procedure: CARDIOVERSION;  Surgeon: Pixie Casino, MD;  Location: [place];  Service: Cardiovascular;  Laterality: N/A;   COLONOSCOPY  09/24/2002   ESOPHAGEAL DILATION N/A 08/19/2016   Procedure: ESOPHAGEAL DILATION;  Surgeon: Melida Quitter, MD;  Location: [place];  Service: ENT;  Laterality: N/A;  esophagoscopy with balloon dilation   ESOPHAGOGASTRODUODENOSCOPY  06/01/2003   12/13   ESOPHAGOGASTRODUODENOSCOPY (EGD) WITH ESOPHAGEAL DILATION     ESOPHAGOSCOPY WITH DILITATION N/A 06/16/2017   Procedure: ESOPHAGOSCOPY WITH BALLOON DILITATION;  Surgeon: Melida Quitter, MD;  Location: [place];  Service: ENT;  Laterality: N/A;   RIGID ESOPHAGOSCOPY N/A 01/18/2013   Procedure: RIGID ESOPHAGOSCOPY WITH ESPHAGEAL Balloon DILATION ;  Surgeon: Melida Quitter, MD;  Location: [place];  Service: ENT;  Laterality: N/A;   RIGID ESOPHAGOSCOPY N/A 06/22/2018   Procedure: RIGID ESOPHAGOSCOPY;  Surgeon: Melida Quitter, MD;  Location: [place];  Service: ENT;  Laterality: N/A;   TEE WITHOUT CARDIOVERSION N/A 02/13/2015   Procedure: TRANSESOPHAGEAL ECHOCARDIOGRAM (TEE);  Surgeon: Pixie Casino, MD;  Location: [facility] ENDOSCOPY;  Service: Cardiovascular;  Laterality: N/A;    Current Outpatient Medications   Medication Sig Dispense Refill   chlorthalidone (HYGROTON) 25 MG tablet Take 0.5 tablets (12.5 mg total) by mouth daily. 30 tablet 3   lansoprazole (PREVACID) 30 MG capsule TAKE 1 CAPSULE BY MOUTH EVERY DAY.   *Yearly appointment required for future refills* 30 capsule 1   losartan (COZAAR) 100 MG tablet Take 1 tablet (100 mg total) by mouth daily. 90 tablet 3   metoprolol tartrate (LOPRESSOR) 25 MG tablet TAKE 0.5 TABLETS BY MOUTH 2 TIMES DAILY. 90 tablet 3   Rivaroxaban (XARELTO) 15 MG TABS tablet Take 1 tablet (15 mg total) by mouth daily with supper. 90 tablet 1   temazepam (RESTORIL) 30 MG capsule Take 1 capsule (30 mg total) by mouth at bedtime as needed. for sleep (Patient taking differently: Take 10 mg by mouth at bedtime as needed for sleep.) 90 capsule 1   No current facility-administered medications for this encounter.    Allergies  Allergen Reactions   Ramipril Cough    Social History   Socioeconomic History   Marital status: Married    Spouse name: Not on file   Number of children: Not on file   Years of education: Not on file   Highest education level: Not on file  Occupational History   Not on file  Tobacco Use   Smoking status: Never   Smokeless tobacco: Never  Vaping Use   Vaping Use: Never used  Substance and Sexual Activity   Alcohol use: No    Alcohol/week: 0.0 standard drinks   Drug use: No   Sexual activity: Not on file  Other Topics Concern   Not on file  Social History Narrative   Not on file   Social Determinants of Health   Financial Resource Strain: Not on file  Food Insecurity: Not on file  Transportation Needs: Not on file  Physical Activity: Not on file  Stress: Not on file  Social Connections: Not on file  Intimate Partner Violence: Not on file    Family History  Problem Relation Age of Onset   Heart attack Mother 19       died from massive heart attack   Aneurysm Father        cerebral   Hypertension Sister    Diabetes type  II Sister    Heart attack Brother    Hypertension Sister    Heart attack Brother    Stroke Neg Hx     ROS- All systems are reviewed and negative except as per the HPI above  Physical Exam: Vitals:   05/29/21 1022  BP: (!) 144/84  Pulse: 92  Weight: 46.2 kg  Height: 5\' 1"  (1.549 m)   Wt Readings from Last 3 Encounters:  05/29/21 46.2 kg  04/19/21 45.4 kg  03/14/21 45.8 kg    Labs: Lab Results  Component Value Date   NA 140 02/22/2021   K 3.8 02/22/2021   CL 106 02/22/2021   CO2 26 02/22/2021   GLUCOSE 99 02/22/2021   BUN 22 02/22/2021   CREATININE 1.40 (H) 02/22/2021  CALCIUM 9.4 02/22/2021   MG 1.7 02/09/2015   Lab Results  Component Value Date   INR 1.00 08/19/2016   Lab Results  Component Value Date   CHOL 205 (H) 02/22/2019   HDL 77 02/22/2019   LDLCALC 112 (H) 02/22/2019   TRIG 91 02/22/2019     GEN- The patient is well appearing, alert and oriented x 3 today.   Head- normocephalic, atraumatic Eyes-  Sclera clear, conjunctiva pink Ears- hearing intact Oropharynx- clear Neck- supple, no JVP Lymph- no cervical lymphadenopathy Lungs- Clear to ausculation bilaterally, normal work of breathing Heart- irregular rate and rhythm, no murmurs, rubs or gallops, PMI not laterally displaced GI- soft, NT, ND, + BS Extremities- no clubbing, cyanosis, or edema MS- no significant deformity or atrophy Skin- no rash or lesion Psych- euthymic mood, full affect Neuro- strength and sensation are intact  EKG-afib at 92 bpm, qrs int 82 ms, qtc 452 ms     Assessment and Plan:  1. Persistent afib Symptomatic in arfib  Rate controlled  S/p ablation 01/16/22 Amiodarone stopped 10/4 for lung scarring seen in the bases  on CT Pt has had increasing afib burden since January and has been persistent for the last 2-3 weeks  No change in health, no triggers identified, possibly amiodarone washout at this point   Will plan on cardioversion  Cbc/bmet   2. HTN Stable    2. CHA2DS2VASc  score of at least 3 Continue xarelto 15 mg daily  States no missed doses for at least 3 weeks    F/u here one week after cardioversion   Butch Penny C. Avey Mcmanamon, [facility] [address] [place], [place] [address] [phone]

## 2021-05-29 NOTE — Patient Instructions (Signed)
Cardioversion scheduled for Tuesday, March 7th  - come to clinic for labs at [place] at the [employer] and go to admitting at 930am  - Do not eat or drink anything after midnight the night prior to your procedure.  - Take all your morning medication (except diabetic medications) with a sip of water prior to arrival.  - You will not be able to drive home after your procedure.  - Do NOT miss any doses of your blood thinner - if you should miss a dose please notify our office immediately.  - If you feel as if you go back into normal rhythm prior to scheduled cardioversion, please notify our office immediately. If your procedure is canceled in the cardioversion suite you will be charged a cancellation fee.

## 2021-06-04 ENCOUNTER — Telehealth (HOSPITAL_COMMUNITY): Payer: Self-pay | Admitting: *Deleted

## 2021-06-04 NOTE — Telephone Encounter (Addendum)
Patient called in stating she returned to [facility] the day after her appointment in office. HR in the 60s ever since which confirmed via her monitor at home.  Cardioversion canceled. She will keep her standing appointment with Dr. Martinique in May but will call if afib returns.

## 2021-06-05 ENCOUNTER — Encounter: Payer: Self-pay | Admitting: Family Medicine

## 2021-06-05 ENCOUNTER — Ambulatory Visit: Payer: BC Managed Care – PPO | Admitting: Family Medicine

## 2021-06-05 DIAGNOSIS — I5021 Acute systolic (congestive) heart failure: Secondary | ICD-10-CM

## 2021-06-05 DIAGNOSIS — K219 Gastro-esophageal reflux disease without esophagitis: Secondary | ICD-10-CM

## 2021-06-05 DIAGNOSIS — I480 Paroxysmal atrial fibrillation: Secondary | ICD-10-CM

## 2021-06-05 MED ORDER — LANSOPRAZOLE 30 MG PO CPDR: 30.0000 mg | DELAYED_RELEASE_CAPSULE | Freq: Every day | ORAL | 3 refills | Status: DC

## 2021-06-05 NOTE — Progress Notes (Signed)
° °  Subjective:    Patient ID: Mackenzie [name], female    DOB: May 21, 1950, 71 y.o.   MRN: [id number]  HPI Here to follow up on GERD. She is doing well with this. She sees Dr. Rayann Heman for the PAF. She had been scheduled for a cardioversion, but this was cancelled because she spends a fair amount of time in sinus rhythm.    Review of Systems  Constitutional: Negative.   Respiratory: Negative.    Cardiovascular: Negative.   Gastrointestinal: Negative.       Objective:   Physical Exam Constitutional:      Appearance: Normal appearance.  Cardiovascular:     Rate and Rhythm: Normal rate. Rhythm irregular.     Pulses: Normal pulses.     Heart sounds: Normal heart sounds.  Pulmonary:     Effort: Pulmonary effort is normal.     Breath sounds: Normal breath sounds.  Abdominal:     General: Abdomen is flat. There is no distension.     Palpations: Abdomen is soft.     Tenderness: There is no abdominal tenderness.  Neurological:     Mental Status: She is alert.          Assessment & Plan:  Her GERD is stable, so we refilled Lansoprazole. She will follow up with Cardiology for the PAF.  [name], MD

## 2021-06-14 ENCOUNTER — Other Ambulatory Visit: Payer: Self-pay | Admitting: Cardiology

## 2021-06-14 DIAGNOSIS — G4700 Insomnia, unspecified: Secondary | ICD-10-CM

## 2021-06-14 DIAGNOSIS — I480 Paroxysmal atrial fibrillation: Secondary | ICD-10-CM

## 2021-06-14 DIAGNOSIS — R0683 Snoring: Secondary | ICD-10-CM

## 2021-06-14 DIAGNOSIS — R400 Somnolence: Secondary | ICD-10-CM

## 2021-06-19 ENCOUNTER — Other Ambulatory Visit (HOSPITAL_COMMUNITY): Payer: BC Managed Care – PPO | Admitting: Nurse Practitioner

## 2021-06-19 ENCOUNTER — Encounter (HOSPITAL_COMMUNITY): Payer: Self-pay

## 2021-06-19 ENCOUNTER — Ambulatory Visit (HOSPITAL_COMMUNITY): Admit: 2021-06-19 | Payer: BC Managed Care – PPO | Admitting: Cardiology

## 2021-06-19 SURGERY — CARDIOVERSION
Anesthesia: General

## 2021-06-26 ENCOUNTER — Ambulatory Visit (HOSPITAL_COMMUNITY): Payer: BC Managed Care – PPO | Admitting: Nurse Practitioner

## 2021-06-27 ENCOUNTER — Other Ambulatory Visit: Payer: Self-pay | Admitting: Cardiology

## 2021-07-10 ENCOUNTER — Ambulatory Visit (HOSPITAL_BASED_OUTPATIENT_CLINIC_OR_DEPARTMENT_OTHER): Payer: BC Managed Care – PPO | Attending: Cardiology | Admitting: Cardiovascular Disease

## 2021-07-10 DIAGNOSIS — I480 Paroxysmal atrial fibrillation: Secondary | ICD-10-CM | POA: Insufficient documentation

## 2021-07-10 DIAGNOSIS — R400 Somnolence: Secondary | ICD-10-CM | POA: Insufficient documentation

## 2021-07-10 DIAGNOSIS — G4700 Insomnia, unspecified: Secondary | ICD-10-CM | POA: Diagnosis not present

## 2021-07-10 DIAGNOSIS — G4733 Obstructive sleep apnea (adult) (pediatric): Secondary | ICD-10-CM | POA: Diagnosis not present

## 2021-07-10 DIAGNOSIS — R0683 Snoring: Secondary | ICD-10-CM | POA: Insufficient documentation

## 2021-07-19 ENCOUNTER — Encounter (HOSPITAL_BASED_OUTPATIENT_CLINIC_OR_DEPARTMENT_OTHER): Payer: Self-pay | Admitting: Cardiovascular Disease

## 2021-07-19 ENCOUNTER — Telehealth: Payer: Self-pay | Admitting: *Deleted

## 2021-07-19 NOTE — Telephone Encounter (Signed)
Patient notified of sleep study results and recommendations. She agrees to proceed with CPAP therapy. Order sent to [facility]. ?

## 2021-07-19 NOTE — Telephone Encounter (Signed)
-----   Message from Troy Sine, MD sent at 07/19/2021  9:13 AM EDT ----- ?Mariann Laster please notify pt and set up for Auto-PAP ?

## 2021-07-19 NOTE — Procedures (Signed)
? ? ? ?  Patient Name: Mackenzie [name], Mackenzie [name] ?Study Date: 07/10/2021 ?Gender: Female ?D.O.B: April 22, 1950 ?Age (years): 38 ?Referring Provider: Peter M Martinique ?Height (inches): 61 ?Interpreting Physician: Shelva Majestic MD, ABSM ?Weight (lbs): 101 ?RPSGT: Jacolyn Reedy ?BMI: 19 ?MRN: [id number] ?Neck Size: 14.00 ? ?CLINICAL INFORMATION ?Sleep Study Type: HST ? ?Indication for sleep study: atrial fibrillation, snoring, non-restorative sleep ? ?Epworth Sleepiness Score: 6 ? ?SLEEP STUDY TECHNIQUE ?A multi-channel overnight portable sleep study was performed. The channels recorded were: nasal airflow, thoracic respiratory movement, and oxygen saturation with a pulse oximetry. Snoring was also monitored. ? ?MEDICATIONS ?chlorthalidone (HYGROTON) 25 MG tablet ?lansoprazole (PREVACID) 30 MG capsule ?losartan (COZAAR) 100 MG tablet (Expired) ?metoprolol tartrate (LOPRESSOR) 25 MG tablet ?Rivaroxaban (XARELTO) 15 MG TABS tablet ?temazepam (RESTORIL) 30 MG capsule  ?Patient self administered medications include: N/A. ? ?SLEEP ARCHITECTURE ?Patient was studied for 365.5 minutes. The sleep efficiency was 100.0 % and the patient was supine for 96.3%. The arousal index was 0.0 per hour. ? ?RESPIRATORY PARAMETERS ?The overall AHI was 9.8 per hour, with a central apnea index of 0 per hour. ? ?The oxygen nadir was 90% during sleep. ? ?CARDIAC DATA ?Mean heart rate during sleep was 91.7 bpm. Maximum heart rate was 125. ? ?IMPRESSIONS ?- Mild obstructive sleep apnea occurred during this study (AHI 9.8/h). ?- The patient had no oxygen desaturation during the study (Min O2 90%) ?- Patient snored 0.5% during the sleep. ? ?DIAGNOSIS ?- Obstructive Sleep Apnea (G47.33) ? ?RECOMMENDATIONS ?- In this patient with cardiovascular comorbidities recommend therapeutic CPAP titration to determine optimal pressure required to alleviate sleep disordered breathing; initiate Auto-PAP with EPR of 3 at 6 - 16 cm of water. ?- Alternative to CPAP such as a  customized oral appliance may be considered. ?- Effort should be made to optimize nasal and oropharyngeal patency. ?- Avoid alcohol, sedatives and other CNS depressants that may worsen sleep apnea and disrupt normal sleep architecture. ?- Sleep hygiene should be reviewed to assess factors that may improve sleep quality. ?- Weight management and regular exercise should be initiated or continued. ?- Recommend a download and sleep clinic evaluation after one month of therapy. ? ? ?[Electronically signed] 07/19/2021 09:09 AM ? ?Shelva Majestic MD, [facility], [facility]M ?Diplomate, Tax adviser of Sleep Medicine ? ?NPI: [id number] ? ?[place] ?PH: [phone]   FX: [phone] ?ACCREDITED BY THE [employer]ICAN [employer] OF SLEEP [employer]ICINE ? ?

## 2021-07-31 DIAGNOSIS — Z961 Presence of intraocular lens: Secondary | ICD-10-CM | POA: Diagnosis not present

## 2021-07-31 DIAGNOSIS — H52203 Unspecified astigmatism, bilateral: Secondary | ICD-10-CM | POA: Diagnosis not present

## 2021-08-01 DIAGNOSIS — G4733 Obstructive sleep apnea (adult) (pediatric): Secondary | ICD-10-CM | POA: Diagnosis not present

## 2021-08-29 NOTE — Progress Notes (Signed)
Cardiology Office Note   Date:  09/05/2021   ID:  Mackenzie, [name] 1950/04/22, MRN [id number]  PCP:  Mackenzie Morale, MD  Cardiologist:  Mackenzie Cart Martinique, MD EP: Mackenzie Grayer, MD  Chief Complaint  Patient presents with   Atrial Fibrillation       History of Present Illness: Mackenzie [name] is a 71 y.o. 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  She was evaluated by me on 02/24/2020 at which time she was doing okay from a cardiology standpoint.  She reported episodes of atrial fibrillation approximately once a month which would last for up to 3 days.  She had a particularly bad episode just prior to her visit with associated nausea, indigestion, and palpitations; felt very weak.  We discussed consideration for an ablation, however patient preferred watchful waiting and if more severe episodes would consider at that time.  Atrial fibrillation history dates back to 2016 when she was diagnosed after presenting with shortness of breath and palpitations.  Echo at that time showed EF 25 to 30%.  Nuclear stress test at that time showed no ischemia.  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  She underwent TEE/DCCV with successful restoration of NSR, however had recurrent A. fib within 1 month.  She was started on amiodarone at that time.  She saw Dr. Rayann [name] with electrophysiology in 2020 for consideration of an ablation though did not go forward with scheduled procedure. She had subsequent recovery in her EF with sinus rhythm. Latest CT scan showed bibasilar scarring. She was later seen by Dr Mackenzie [name] with recurrent Afib despite amiodarone. She had ablation 01/16/21. Amiodarone was later discontinued. Was seen last month and doing well except BP was elevated and medications adjusted. She has not had a sleep study yet although does snore a lot and has poor sleep.  In  February was seen in Afib clinic with increased Afib burden. She was scheduled for cardioversion but converted on her own. She did have a sleep study showing mild OSA.   She reports she tried CPAP but hasn't been able to tolerate the mask. She keeps a diary and notes she had AFib 11 days in the past month. She tells this by symptoms and Irregular pulse noted on BP monitor. Has a Kardiomobile but hasn't used it. Notes BP is labile and when in Afib tends to run low.       Past Medical History:  Diagnosis Date   Chronic systolic CHF (congestive heart failure) ([place])    a. 2D ECHO on 02/10/15 w/ severe LV dysfunction with EF 25-30%. Mild MR and mild LAE. negative nuclear stress test, felt to be due to tachycardia    Dyspnea    with exertion   Dysrhythmia    PAF   Esophageal stricture    a. requiring dilations. crushes pills to take PO   Full dentures    GERD (gastroesophageal reflux disease)    Osteoporosis    sees Dr. Carrolyn [name]    PAF (paroxysmal atrial fibrillation) [facility])    a. s/p succesfful DCCV on 02/13/15   Pneumonia    (08/15/16)- many years ago   Sebaceous cyst    on back of neck, has seen Dr. Ronnald [name]    Past Surgical History:  Procedure Laterality Date   ATRIAL FIBRILLATION ABLATION N/A 01/16/2021   Procedure: [place];  Surgeon:  Mackenzie Grayer, MD;  Location: [place] [facility];  Service: Cardiovascular;  Laterality: N/A;   BALLOON DILATION N/A 06/22/2018   Procedure: ESOPHAGEAL BALLOON DILATION;  Surgeon: Mackenzie Quitter, MD;  Location: [place];  Service: ENT;  Laterality: N/A;   BREAST SURGERY Bilateral    Breast Implants   CARDIOVERSION N/A 02/13/2015   Procedure: CARDIOVERSION;  Surgeon: Mackenzie Casino, MD;  Location: [place];  Service: Cardiovascular;  Laterality: N/A;   COLONOSCOPY  09/24/2002   ESOPHAGEAL DILATION N/A 08/19/2016   Procedure: ESOPHAGEAL DILATION;  Surgeon: Mackenzie Quitter, MD;  Location: [place];  Service: ENT;   Laterality: N/A;  esophagoscopy with balloon dilation   ESOPHAGOGASTRODUODENOSCOPY  06/01/2003   12/13   ESOPHAGOGASTRODUODENOSCOPY (EGD) WITH ESOPHAGEAL DILATION     ESOPHAGOSCOPY WITH DILITATION N/A 06/16/2017   Procedure: ESOPHAGOSCOPY WITH BALLOON DILITATION;  Surgeon: Mackenzie Quitter, MD;  Location: [place];  Service: ENT;  Laterality: N/A;   RIGID ESOPHAGOSCOPY N/A 01/18/2013   Procedure: RIGID ESOPHAGOSCOPY WITH ESPHAGEAL Balloon DILATION ;  Surgeon: Mackenzie Quitter, MD;  Location: [place];  Service: ENT;  Laterality: N/A;   RIGID ESOPHAGOSCOPY N/A 06/22/2018   Procedure: RIGID ESOPHAGOSCOPY;  Surgeon: Mackenzie Quitter, MD;  Location: [place];  Service: ENT;  Laterality: N/A;   TEE WITHOUT CARDIOVERSION N/A 02/13/2015   Procedure: TRANSESOPHAGEAL ECHOCARDIOGRAM (TEE);  Surgeon: Mackenzie Casino, MD;  Location: [facility] ENDOSCOPY;  Service: Cardiovascular;  Laterality: N/A;     Current Outpatient Medications  Medication Sig Dispense Refill   chlorthalidone (HYGROTON) 25 MG tablet Take 0.5 tablets (12.5 mg total) by mouth daily. 30 tablet 3   lansoprazole (PREVACID) 30 MG capsule Take 1 capsule (30 mg total) by mouth daily. 90 capsule 3   losartan (COZAAR) 100 MG tablet Take 1 tablet (100 mg total) by mouth daily. 90 tablet 3   metoprolol tartrate (LOPRESSOR) 25 MG tablet TAKE 0.5 TABLETS BY MOUTH 2 TIMES DAILY. 90 tablet 3   Rivaroxaban (XARELTO) 15 MG TABS tablet Take 1 tablet (15 mg total) by mouth daily with supper. 90 tablet 1   temazepam (RESTORIL) 30 MG capsule Take 1 capsule (30 mg total) by mouth at bedtime as needed. for sleep (Patient not taking: Reported on 09/05/2021) 90 capsule 1   No current facility-administered medications for this visit.    Allergies:   Ramipril    Social History:  The patient  reports that she has never smoked. She has never used smokeless tobacco. She reports that she does not drink alcohol and does not use drugs.    Family History:  The patient's family history includes Aneurysm in her father; Diabetes type II in her sister; Heart attack in her brother and brother; Heart attack (age of onset: 47) in her mother; Hypertension in her sister and sister.    ROS:  Please see the history of present illness.   Otherwise, review of systems are positive for none.   All other systems are reviewed and negative.    PHYSICAL EXAM: VS:  BP 139/80   Pulse 66   Ht '5\' 1"'$  (1.549 m)   Wt 101 lb (45.8 kg)   SpO2 93%   BMI 19.08 kg/m  , BMI Body mass index is 19.08 kg/m. GEN: Well nourished, well developed, in no acute distress HEENT: sclera anicteric Neck: no JVD, carotid bruits, or masses Cardiac: RRR; no murmurs, rubs, or gallops, no edema  Respiratory:  clear to auscultation bilaterally, normal work  of breathing GI: soft, nontender, nondistended, + BS MS: no deformity or atrophy Skin: warm and dry, no rash Neuro:  Strength and sensation are intact Psych: euthymic mood, full affect   EKG:  EKG is not ordered today.    Recent Labs: 12/29/2020: Hemoglobin 11.7; Platelets 256 02/22/2021: BUN 22; Creatinine, Ser 1.40; Potassium 3.8; Sodium 140    Lipid Panel    Component Value Date/Time   CHOL 205 (H) 02/22/2019 1117   TRIG 91 02/22/2019 1117   HDL 77 02/22/2019 1117   CHOLHDL 2.7 02/22/2019 1117   CHOLHDL 3.0 02/10/2015 0126   VLDL 12 02/10/2015 0126   LDLCALC 112 (H) 02/22/2019 1117   LDLDIRECT 121.0 03/14/2010 0931      Wt Readings from Last 3 Encounters:  09/05/21 101 lb (45.8 kg)  07/10/21 101 lb (45.8 kg)  05/29/21 101 lb 12.8 oz (46.2 kg)      Other studies Reviewed: Additional studies/ records that were reviewed today include:   Echocardiogram 2019: Study Conclusions   - Left ventricle: The cavity size was normal. Wall thickness was    normal. Systolic function was normal. The estimated ejection    fraction was in the range of 55% to 60%. Wall motion was normal;    there  were no regional wall motion abnormalities. Doppler    parameters are consistent with abnormal left ventricular    relaxation (grade 2 diastolic dysfunction). The E/e&' ratio is    >15, suggesting elevated LV filling pressure.  - Mitral valve: Mildly thickened leaflets . There was trivial    regurgitation.  - Left atrium: The atrium was normal in size.  - Tricuspid valve: There was mild regurgitation.  - Pulmonary arteries: PA peak pressure: 34 mm Hg (S).  - Inferior vena cava: The vessel was normal in size. The    respirophasic diameter changes were in the normal range (>= 50%),    consistent with normal central venous pressure.   Impressions:   - Compared to a prior study in 2017, there are no significant    changes.   NST 2016: 1. No reversible ischemia or infarction. Small, mild fixed defect along the anterior apex.   2. Global hypokinesis.   3.  Decreased left ventricular ejection fraction of 21%.   4. High-risk stress test findings*.  Echo 10/27/20: IMPRESSIONS     1. Left ventricular ejection fraction, by estimation, is 60 to 65%. The  left ventricle has normal function. The left ventricle has no regional  wall motion abnormalities. There is mild asymmetric left ventricular  hypertrophy. Left ventricular diastolic  parameters are indeterminate. Elevated left ventricular end-diastolic  pressure.   2. Right ventricular systolic function is normal. The right ventricular  size is normal.   3. The mitral valve is normal in structure. Trivial mitral valve  regurgitation.   4. The aortic valve is normal in structure. Aortic valve regurgitation is  trivial.    ASSESSMENT AND PLAN: Paroxysmal Afib- S/p Afib ablation in early October. - Recurrent episodes. -Continue Xarelto for stroke ppx.   - Continue metoprolol for rate control - off amiodarone. - needs follow up with EP. Will arrange follow up with Dr Lurline Hare or [facility] since Dr Mackenzie [name] is gone. - encouraged her to  use Kariomobile device to document episodes.   2.  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 She appears euvolemic on exam. - Continue chlorthalidone, losartan, and metoprolol  3.  Labile  hypertension:  - continue losartan and metoprolol - BP improved since she is taking chlorthalidone daily. - would avoid higher doses of medication since she does get hypotensive at times.   4.  Insomnia/daytime somnolence/snoring: Longstanding issue with poor sleep quality and snoring - OSA noted on sleep study - reports intolerance of CPAP - needs follow up with Dr Claiborne Billings to discuss options     Disposition:   FU with Dr. Martinique in 6 months  Signed, [place] Kretz Martinique, MD  09/05/2021 9:35 AM

## 2021-09-05 ENCOUNTER — Encounter: Payer: Self-pay | Admitting: Cardiology

## 2021-09-05 ENCOUNTER — Ambulatory Visit: Payer: BC Managed Care – PPO | Admitting: Cardiology

## 2021-09-05 VITALS — BP 139/80 | HR 66 | Ht 61.0 in | Wt 101.0 lb

## 2021-09-05 DIAGNOSIS — I10 Essential (primary) hypertension: Secondary | ICD-10-CM

## 2021-09-05 DIAGNOSIS — I4819 Other persistent atrial fibrillation: Secondary | ICD-10-CM

## 2021-09-05 DIAGNOSIS — R0989 Other specified symptoms and signs involving the circulatory and respiratory systems: Secondary | ICD-10-CM | POA: Diagnosis not present

## 2021-09-05 NOTE — Patient Instructions (Signed)
Medication Instructions:  Your physician recommends that you continue on your current medications as directed. Please refer to the Current Medication list given to you today.  *If you need a refill on your cardiac medications before your next appointment, please call your pharmacy*   Lab Work: None If you have labs (blood work) drawn today and your tests are completely normal, you will receive your results only by: [place] (if you have MyChart) OR A paper copy in the mail If you have any lab test that is abnormal or we need to change your treatment, we will call you to review the results.   Testing/Procedures: None   Follow-Up: At [facility], you and your health needs are our priority.  As part of our continuing mission to provide you with exceptional heart care, we have created designated Provider Care Teams.  These Care Teams include your primary Cardiologist (physician) and Advanced Practice Providers (APPs -  Physician Assistants and Nurse Practitioners) who all work together to provide you with the care you need, when you need it.  We recommend signing up for the patient portal called "MyChart".  Sign up information is provided on this After Visit Summary.  MyChart is used to connect with patients for Virtual Visits (Telemedicine).  Patients are able to view lab/test results, encounter notes, upcoming appointments, etc.  Non-urgent messages can be sent to your provider as well.   To learn more about what you can do with MyChart, go to [email or web address].    Your next appointment:   6 month(s)  The format for your next appointment:   In Person  Provider:   Peter Martinique, MD     Other Instructions Please contact HeartCare at [facility] office to set up an appt with Dr. Curt Bears or Dr. Quentin Ore.  Phone number: [phone].   Important Information About [place]

## 2021-10-29 ENCOUNTER — Telehealth: Payer: Self-pay | Admitting: Cardiovascular Disease

## 2021-10-29 NOTE — Telephone Encounter (Signed)
Called to schedule sleep compliance appt, pt returned machine.

## 2021-11-09 ENCOUNTER — Telehealth: Payer: Self-pay

## 2021-11-09 NOTE — Chronic Care Management (AMB) (Signed)
  Care Management   Outreach Note  11/09/2021 Name: Mackenzie [name] MRN: [id number] DOB: 1950/09/02  An unsuccessful telephone outreach was attempted today. The patient was referred to the case management team for assistance with care management and care coordination.   Follow Up Plan:  A HIPAA compliant phone message was left for the patient providing contact information and requesting a return call.  The care management team will reach out to the patient again over the next 7 days.  If patient returns call to provider office, please advise to call  [facility]* at [place], [place], [place] [address] Direct Dial: [phone] [email or web address]

## 2021-11-14 ENCOUNTER — Other Ambulatory Visit: Payer: Self-pay | Admitting: Cardiology

## 2021-11-14 NOTE — Telephone Encounter (Signed)
Rx(s) sent to pharmacy electronically.  

## 2021-11-29 NOTE — Chronic Care Management (AMB) (Signed)
  Care Coordination  Note  11/29/2021 Name: TALYAH SEDER MRN: [id number] DOB: [birth year]  LESHAE MCCLAY is a 71 y.o. year old female who is a primary care patient of Laurey Morale, MD. I reached out to [name] by phone today to offer care coordination services.      Ms. Aramburo was given information about Care Coordination services today including:  The Care Coordination services include support from the care team which includes your Nurse Coordinator, Clinical Social Worker, or Pharmacist.  The Care Coordination team is here to help remove barriers to the health concerns and goals most important to you. Care Coordination services are voluntary and the patient may decline or stop services at any time by request to their care team member.   Patient agreed to services and verbal consent obtained.   Follow up plan: Telephone appointment with care coordination team member scheduled for:12/19/2021   [name], [place], [place] [address] Direct Dial: [phone] [email or web address]

## 2021-12-05 ENCOUNTER — Ambulatory Visit: Payer: BC Managed Care – PPO | Admitting: Cardiology

## 2021-12-05 NOTE — Progress Notes (Deleted)
Electrophysiology Office Follow up Visit Note:    Date:  12/05/2021   ID:  Mackenzie, [name] 07-30-1950, MRN [id number]  PCP:  Laurey Morale, MD  [place] Cardiologist:  Peter Martinique, MD  [facility] Electrophysiologist:  Vickie Epley, MD    Interval History:    Mackenzie [name] is a 71 y.o. female who presents for a follow up visit.  Mackenzie [name] was last seen by Dr. Rayann Heman April 19, 2021.  She has had a prior atrial fibrillation ablation on January 16, 2021.  She takes Xarelto for stroke prophylaxis.  At the appointment with Dr. Rayann Heman, the patient reported occasional palpitations but much improved since before the ablation procedure.  She saw Dr. Martinique on Sep 05, 2021.  She reported recurrence of atrial fibrillation since stopping amiodarone.  These are symptomatic.  She is referred to discuss treatment options for her recurrent atrial fibrillation.       Past Medical History:  Diagnosis Date   Chronic systolic CHF (congestive heart failure) ([place])    a. 2D ECHO on 02/10/15 w/ severe LV dysfunction with EF 25-30%. Mild MR and mild LAE. negative nuclear stress test, felt to be due to tachycardia    Dyspnea    with exertion   Dysrhythmia    PAF   Esophageal stricture    a. requiring dilations. crushes pills to take PO   Full dentures    GERD (gastroesophageal reflux disease)    Osteoporosis    sees Dr. Carrolyn Meiers    PAF (paroxysmal atrial fibrillation) [facility])    a. s/p succesfful DCCV on 02/13/15   Pneumonia    (08/15/16)- many years ago   Sebaceous cyst    on back of neck, has seen Dr. Ronnald Collum    Past Surgical History:  Procedure Laterality Date   ATRIAL FIBRILLATION ABLATION N/A 01/16/2021   Procedure: [place];  Surgeon: Thompson Grayer, MD;  Location: [place] [facility];  Service: Cardiovascular;  Laterality: N/A;   BALLOON DILATION N/A 06/22/2018   Procedure: ESOPHAGEAL BALLOON DILATION;  Surgeon: Melida Quitter, MD;  Location: [place];  Service: ENT;  Laterality: N/A;   BREAST SURGERY Bilateral    Breast Implants   CARDIOVERSION N/A 02/13/2015   Procedure: CARDIOVERSION;  Surgeon: Pixie Casino, MD;  Location: [place];  Service: Cardiovascular;  Laterality: N/A;   COLONOSCOPY  09/24/2002   ESOPHAGEAL DILATION N/A 08/19/2016   Procedure: ESOPHAGEAL DILATION;  Surgeon: Melida Quitter, MD;  Location: [place];  Service: ENT;  Laterality: N/A;  esophagoscopy with balloon dilation   ESOPHAGOGASTRODUODENOSCOPY  06/01/2003   12/13   ESOPHAGOGASTRODUODENOSCOPY (EGD) WITH ESOPHAGEAL DILATION     ESOPHAGOSCOPY WITH DILITATION N/A 06/16/2017   Procedure: ESOPHAGOSCOPY WITH BALLOON DILITATION;  Surgeon: Melida Quitter, MD;  Location: [place];  Service: ENT;  Laterality: N/A;   RIGID ESOPHAGOSCOPY N/A 01/18/2013   Procedure: RIGID ESOPHAGOSCOPY WITH ESPHAGEAL Balloon DILATION ;  Surgeon: Melida Quitter, MD;  Location: [place];  Service: ENT;  Laterality: N/A;   RIGID ESOPHAGOSCOPY N/A 06/22/2018   Procedure: RIGID ESOPHAGOSCOPY;  Surgeon: Melida Quitter, MD;  Location: [place];  Service: ENT;  Laterality: N/A;   TEE WITHOUT CARDIOVERSION N/A 02/13/2015   Procedure: TRANSESOPHAGEAL ECHOCARDIOGRAM (TEE);  Surgeon: Pixie Casino, MD;  Location: [facility] ENDOSCOPY;  Service: Cardiovascular;  Laterality: N/A;    Current Medications: No outpatient medications have been marked as taking for the 12/05/21  encounter (Appointment) with Vickie Epley, MD.     Allergies:   Ramipril   Social History   Socioeconomic History   Marital status: Married    Spouse name: Not on file   Number of children: Not on file   Years of education: Not on file   Highest education level: Not on file  Occupational History   Not on file  Tobacco Use   Smoking status: Never   Smokeless tobacco: Never  Vaping Use   Vaping Use: Never used  Substance and Sexual Activity   Alcohol use: No     Alcohol/week: 0.0 standard drinks of alcohol   Drug use: No   Sexual activity: Not on file  Other Topics Concern   Not on file  Social History Narrative   Not on file   Social Determinants of Health   Financial Resource Strain: Not on file  Food Insecurity: Not on file  Transportation Needs: Not on file  Physical Activity: Not on file  Stress: Not on file  Social Connections: Not on file     Family History: The patient's family history includes Aneurysm in her father; Diabetes type II in her sister; Heart attack in her brother and brother; Heart attack (age of onset: 49) in her mother; Hypertension in her sister and sister. There is no history of Stroke.  ROS:   Please see the history of present illness.    All other systems reviewed and are negative.  EKGs/Labs/Other Studies Reviewed:    The following studies were reviewed today:      EKG:  The ekg ordered today demonstrates ***  Recent Labs: 12/29/2020: Hemoglobin 11.7; Platelets 256 02/22/2021: BUN 22; Creatinine, Ser 1.40; Potassium 3.8; Sodium 140  Recent Lipid Panel    Component Value Date/Time   CHOL 205 (H) 02/22/2019 1117   TRIG 91 02/22/2019 1117   HDL 77 02/22/2019 1117   CHOLHDL 2.7 02/22/2019 1117   CHOLHDL 3.0 02/10/2015 0126   VLDL 12 02/10/2015 0126   LDLCALC 112 (H) 02/22/2019 1117   LDLDIRECT 121.0 03/14/2010 0931    Physical Exam:    VS:  There were no vitals taken for this visit.    Wt Readings from Last 3 Encounters:  09/05/21 101 lb (45.8 kg)  07/10/21 101 lb (45.8 kg)  05/29/21 101 lb 12.8 oz (46.2 kg)     GEN: *** Well nourished, well developed in no acute distress HEENT: Normal NECK: No JVD; No carotid bruits LYMPHATICS: No lymphadenopathy CARDIAC: ***RRR, no murmurs, rubs, gallops RESPIRATORY:  Clear to auscultation without rales, wheezing or rhonchi  ABDOMEN: Soft, non-tender, non-distended MUSCULOSKELETAL:  No edema; No deformity  SKIN: Warm and dry NEUROLOGIC:  Alert  and oriented x 3 PSYCHIATRIC:  Normal affect        ASSESSMENT:    1. Persistent atrial fibrillation ([place])   2. Chronic systolic CHF (congestive heart failure) (HCC)   3. Esophageal stricture    PLAN:    In order of problems listed above:   GI/ENT for esophageal dilation Consider future Tikosyn if esophageal issues are resolved         Total time spent with patient today *** minutes. This includes reviewing records, evaluating the patient and coordinating care.   Medication Adjustments/Labs and Tests Ordered: Current medicines are reviewed at length with the patient today.  Concerns regarding medicines are outlined above.  No orders of the defined types were placed in this encounter.  No orders of the defined types  were placed in this encounter.    Signed, Lars Mage, MD, [facility], [facility] 12/05/2021 6:09 AM    Electrophysiology [facility]

## 2021-12-06 ENCOUNTER — Encounter: Payer: Self-pay | Admitting: Cardiology

## 2021-12-06 ENCOUNTER — Telehealth: Payer: Self-pay | Admitting: Cardiology

## 2021-12-06 NOTE — Telephone Encounter (Signed)
Patient is having an Afib episode that started on Monday 8/21 when she started getting a cold, with low grade fevers and congestion.  VS: 12/06/21 9:00 am 128/110 HR 103, 10:00 am 107/77 HR 90 4:00 96/59 HR 102 She is not very concerned about the Afib because that is not new for her. She was calling in because her HR is normally rate controlled but this time it is in the 90-100's. She is going out of town for the weekend but will be back Sunday evening if she needs to be seen. No missed dose of medication. Advise the Afib is most likely being caused by the cold she has and recommended to make sure she is staying hydrated. Gave ED precaution. Will forward to MD for advisement.

## 2021-12-06 NOTE — Telephone Encounter (Signed)
Patient c/o Palpitations:  High priority if patient c/o lightheadedness, shortness of breath, or chest pain  How long have you had palpitations/irregular HR/ Afib? Are you having the symptoms now? Afib episode started Monday, is having Afib now.   Are you currently experiencing lightheadedness, SOB or CP? No  Do you have a history of afib (atrial fibrillation) or irregular heart rhythm? Yes  Have you checked your BP or HR? (document readings if available): 12/06/21 9:00 am 128/110 HR 103, 10:00 am 107/77 HR 90 4:00 96/59 HR 102   Are you experiencing any other symptoms? Tired    Has been off and on since Monday when she started coming down with a cold. Patient is supposed to be leaving to go out of town in the morning and is requesting a callback before then.

## 2021-12-06 NOTE — Telephone Encounter (Signed)
Error

## 2021-12-10 NOTE — Telephone Encounter (Signed)
Patient feeling much better and afib has subsided. She is back to baseline does not feel she needs to be seen in clinic now. If her AF burden should increase she will call back.

## 2021-12-18 ENCOUNTER — Telehealth: Payer: Self-pay

## 2021-12-18 NOTE — Chronic Care Management (AMB) (Signed)
  Care Coordination Note  12/18/2021 Name: Mackenzie [name] MRN: [id number] DOB: 1950-08-23  Mackenzie [name] is a 71 y.o. year old female who is a primary care patient of Laurey Morale, MD and is actively engaged with the care management team. I reached out to [name] by phone today to assist with re-scheduling an initial visit with the RN Case Manager  Follow up plan: Unsuccessful telephone outreach attempt made. A HIPAA compliant phone message was left for the patient providing contact information and requesting a return call.   [name], [place], [place] [address] Direct Dial: [phone] [name]

## 2021-12-19 ENCOUNTER — Encounter: Payer: Self-pay | Admitting: *Deleted

## 2021-12-20 ENCOUNTER — Other Ambulatory Visit: Payer: Self-pay | Admitting: Cardiology

## 2022-01-02 NOTE — Chronic Care Management (AMB) (Signed)
  Care Coordination Note  01/02/2022 Name: Mackenzie [name] MRN: [id number] DOB: October 11, 1950  Mackenzie [name] is a 71 y.o. year old female who is a primary care patient of Laurey Morale, MD and is actively engaged with the care management team. I reached out to [name] by phone today to assist with re-scheduling an initial visit with the RN Case Manager  Follow up plan: Patient declines further follow up and engagement by the care management team. Appropriate care team members and provider have been notified via electronic communication.   [name], [place], [place] [address] Direct Dial: [phone] [email or web address]

## 2022-01-22 DIAGNOSIS — I83813 Varicose veins of bilateral lower extremities with pain: Secondary | ICD-10-CM | POA: Diagnosis not present

## 2022-01-30 ENCOUNTER — Other Ambulatory Visit: Payer: Self-pay | Admitting: Cardiology

## 2022-01-30 DIAGNOSIS — I4819 Other persistent atrial fibrillation: Secondary | ICD-10-CM

## 2022-01-30 NOTE — Telephone Encounter (Signed)
Prescription refill request for Xarelto received.  Indication:Afib  Last office visit:09/05/21 ([place])  Weight: 45.8kg Age: 71 Scr: 1.40 (02/22/21) CrCl: 26.1m/min  Appropriate dose and refill sent to requested pharmacy

## 2022-03-08 NOTE — Progress Notes (Signed)
Cardiology Office Note   Date:  03/13/2022   ID:  Mackenzie, [name] May 10, 1950, MRN [id number]  PCP:  Laurey Morale, MD  Cardiologist:  Stephanne Greeley [place], MD EP: Vickie Epley, MD  Chief Complaint  Patient presents with   Atrial Fibrillation       History of Present Illness: Mackenzie [name] is a 71 y.o. 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  She was evaluated by me on 02/24/2020 at which time she was doing okay from a cardiology standpoint.  She reported episodes of atrial fibrillation approximately once a month which would last for up to 3 days.  She had a particularly bad episode just prior to her visit with associated nausea, indigestion, and palpitations; felt very weak.  We discussed consideration for an ablation, however patient preferred watchful waiting and if more severe episodes would consider at that time.  Atrial fibrillation history dates back to 2016 when she was diagnosed after presenting with shortness of breath and palpitations.  Echo at that time showed EF 25 to 30%.  Nuclear stress test at that time showed no ischemia.  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  She underwent TEE/DCCV with successful restoration of NSR, however had recurrent A. fib within 1 month.  She was started on amiodarone at that time.  She saw Dr. Rayann Heman with electrophysiology in 2020 for consideration of an ablation though did not go forward with scheduled procedure. She had subsequent recovery in her EF with sinus rhythm. Latest CT scan showed bibasilar scarring. She was later seen by Dr Rayann Heman with recurrent Afib despite amiodarone. She had ablation 01/16/21. Amiodarone was later discontinued.   In February was seen in Afib clinic with increased Afib burden. She was scheduled for cardioversion but converted on her own. She did have a sleep study showing mild  OSA.   She reports she tried CPAP but hasn't been able to tolerate the mask.   She was seen recently by Dr Quentin Ore for her recurrent Afib. Not felt to be a good candidate for Tikosyn due to CKD. Discussed trying amiodarone versus repeat ablation. She would like to try ablation again and is scheduled on Feb 19. She is still quite aware of her AFib. Worse at night when lying down. Has some exercise intolerance and SOB going up stairs.      Past Medical History:  Diagnosis Date   Chronic systolic CHF (congestive heart failure) ([place])    a. 2D ECHO on 02/10/15 w/ severe LV dysfunction with EF 25-30%. Mild MR and mild LAE. negative nuclear stress test, felt to be due to tachycardia    Dyspnea    with exertion   Dysrhythmia    PAF   Esophageal stricture    a. requiring dilations. crushes pills to take PO   Full dentures    GERD (gastroesophageal reflux disease)    Osteoporosis    sees Dr. Carrolyn Meiers    PAF (paroxysmal atrial fibrillation) [facility])    a. s/p succesfful DCCV on 02/13/15   Pneumonia    (08/15/16)- many years ago   Sebaceous cyst    on back of neck, has seen Dr. Ronnald Collum    Past Surgical History:  Procedure Laterality Date   ATRIAL FIBRILLATION ABLATION N/A 01/16/2021   Procedure: [place];  Surgeon: Thompson Grayer, MD;  Location: [place] CV LAB;  Service: Cardiovascular;  Laterality: N/A;   BALLOON DILATION N/A 06/22/2018   Procedure: ESOPHAGEAL BALLOON DILATION;  Surgeon: Melida Quitter, MD;  Location: [place];  Service: ENT;  Laterality: N/A;   BREAST SURGERY Bilateral    Breast Implants   CARDIOVERSION N/A 02/13/2015   Procedure: CARDIOVERSION;  Surgeon: Pixie Casino, MD;  Location: [place];  Service: Cardiovascular;  Laterality: N/A;   COLONOSCOPY  09/24/2002   ESOPHAGEAL DILATION N/A 08/19/2016   Procedure: ESOPHAGEAL DILATION;  Surgeon: Melida Quitter, MD;  Location: [place];  Service: ENT;  Laterality: N/A;  esophagoscopy  with balloon dilation   ESOPHAGOGASTRODUODENOSCOPY  06/01/2003   12/13   ESOPHAGOGASTRODUODENOSCOPY (EGD) WITH ESOPHAGEAL DILATION     ESOPHAGOSCOPY WITH DILITATION N/A 06/16/2017   Procedure: ESOPHAGOSCOPY WITH BALLOON DILITATION;  Surgeon: Melida Quitter, MD;  Location: [place];  Service: ENT;  Laterality: N/A;   RIGID ESOPHAGOSCOPY N/A 01/18/2013   Procedure: RIGID ESOPHAGOSCOPY WITH ESPHAGEAL Balloon DILATION ;  Surgeon: Melida Quitter, MD;  Location: [place];  Service: ENT;  Laterality: N/A;   RIGID ESOPHAGOSCOPY N/A 06/22/2018   Procedure: RIGID ESOPHAGOSCOPY;  Surgeon: Melida Quitter, MD;  Location: [place];  Service: ENT;  Laterality: N/A;   TEE WITHOUT CARDIOVERSION N/A 02/13/2015   Procedure: TRANSESOPHAGEAL ECHOCARDIOGRAM (TEE);  Surgeon: Pixie Casino, MD;  [facility] ENDOSCOPY;  Service: Cardiovascular;  Laterality: N/A;     Current Outpatient Medications  Medication Sig Dispense Refill   chlorthalidone (HYGROTON) 25 MG tablet TAKE 1/2 TABLET BY MOUTH EVERY DAY 45 tablet 2   lansoprazole (PREVACID) 30 MG capsule Take 1 capsule (30 mg total) by mouth daily. 90 capsule 3   losartan (COZAAR) 100 MG tablet TAKE 1 TABLET BY MOUTH EVERY DAY 90 tablet 3   metoprolol tartrate (LOPRESSOR) 25 MG tablet TAKE 1/2 TABLET BY MOUTH TWICE A DAY 90 tablet 2   Rivaroxaban (XARELTO) 15 MG TABS tablet TAKE 1 TABLET (15 MG TOTAL) BY MOUTH DAILY WITH SUPPER 90 tablet 1   temazepam (RESTORIL) 30 MG capsule Take 1 capsule (30 mg total) by mouth at bedtime as needed. for sleep 90 capsule 1   No current facility-administered medications for this visit.    Allergies:   Ramipril    Social History:  The patient  reports that she has never smoked. She has never used smokeless tobacco. She reports that she does not drink alcohol and does not use drugs.   Family History:  The patient's family history includes Aneurysm in her father; Diabetes type II in her  sister; Heart attack in her brother and brother; Heart attack (age of onset: 83) in her mother; Hypertension in her sister and sister.    ROS:  Please see the history of present illness.   Otherwise, review of systems are positive for none.   All other systems are reviewed and negative.    PHYSICAL EXAM: VS:  BP 128/82 (BP Location: Left Arm, Patient Position: Sitting, Cuff Size: Normal)   Pulse 76   Ht '5\' 1"'$  (1.549 m)   Wt 100 lb 9.6 oz (45.6 kg)   SpO2 99%   BMI 19.01 kg/m  , BMI Body mass index is 19.01 kg/m. GEN: Well nourished, well developed, in no acute distress HEENT: sclera anicteric Neck: no JVD, carotid bruits, or masses Cardiac: RRR; no murmurs, rubs, or gallops, no edema  Respiratory:  clear to auscultation bilaterally, normal work of breathing GI: soft, nontender, nondistended, + BS  MS: no deformity or atrophy Skin: warm and dry, no rash Neuro:  Strength and sensation are intact Psych: euthymic mood, full affect   EKG:  EKG is not ordered today.    Recent Labs: No results found for requested labs within last 365 days.    Lipid Panel    Component Value Date/Time   CHOL 205 (H) 02/22/2019 1117   TRIG 91 02/22/2019 1117   HDL 77 02/22/2019 1117   CHOLHDL 2.7 02/22/2019 1117   CHOLHDL 3.0 02/10/2015 0126   VLDL 12 02/10/2015 0126   LDLCALC 112 (H) 02/22/2019 1117   LDLDIRECT 121.0 03/14/2010 0931      Wt Readings from Last 3 Encounters:  03/13/22 100 lb 9.6 oz (45.6 kg)  03/11/22 99 lb 12.8 oz (45.3 kg)  09/05/21 101 lb (45.8 kg)      Other studies Reviewed: Additional studies/ records that were reviewed today include:   Echocardiogram 2019: Study Conclusions   - Left ventricle: The cavity size was normal. Wall thickness was    normal. Systolic function was normal. The estimated ejection    fraction was in the range of 55% to 60%. Wall motion was normal;    there were no regional wall motion abnormalities. Doppler    parameters are  consistent with abnormal left ventricular    relaxation (grade 2 diastolic dysfunction). The E/e&' ratio is    >15, suggesting elevated LV filling pressure.  - Mitral valve: Mildly thickened leaflets . There was trivial    regurgitation.  - Left atrium: The atrium was normal in size.  - Tricuspid valve: There was mild regurgitation.  - Pulmonary arteries: PA peak pressure: 34 mm Hg (S).  - Inferior vena cava: The vessel was normal in size. The    respirophasic diameter changes were in the normal range (>= 50%),    consistent with normal central venous pressure.   Impressions:   - Compared to a prior study in 2017, there are no significant    changes.   NST 2016: 1. No reversible ischemia or infarction. Small, mild fixed defect along the anterior apex.   2. Global hypokinesis.   3.  Decreased left ventricular ejection fraction of 21%.   4. High-risk stress test findings*.  Echo 10/27/20: IMPRESSIONS     1. Left ventricular ejection fraction, by estimation, is 60 to 65%. The  left ventricle has normal function. The left ventricle has no regional  wall motion abnormalities. There is mild asymmetric left ventricular  hypertrophy. Left ventricular diastolic  parameters are indeterminate. Elevated left ventricular end-diastolic  pressure.   2. Right ventricular systolic function is normal. The right ventricular  size is normal.   3. The mitral valve is normal in structure. Trivial mitral valve  regurgitation.   4. The aortic valve is normal in structure. Aortic valve regurgitation is  trivial.    ASSESSMENT AND PLAN: Paroxysmal Afib- S/p Afib ablation in October 2022. - Recurrent episodes- more frequent -Continue Xarelto for stroke ppx.   - Continue metoprolol for rate control - off amiodarone. - has seen Dr Quentin Ore with plans for repeat Ablation in Feb  2.  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 She  appears euvolemic on exam. - Continue chlorthalidone, losartan, and metoprolol  3.  Labile hypertension:  - continue losartan and metoprolol - BP improved since she is taking chlorthalidone daily. - would avoid higher doses of medication since she does get hypotensive  at times.   4.  Insomnia/daytime somnolence/snoring:  - OSA noted on sleep study - reports intolerance of CPAP      Disposition:   FU with mein 6 months  Signed, Isacc Turney [place], MD  03/13/2022 10:33 AM

## 2022-03-09 ENCOUNTER — Other Ambulatory Visit (HOSPITAL_COMMUNITY): Payer: Self-pay | Admitting: Nurse Practitioner

## 2022-03-11 ENCOUNTER — Encounter: Payer: Self-pay | Admitting: Cardiology

## 2022-03-11 ENCOUNTER — Ambulatory Visit: Payer: BC Managed Care – PPO | Attending: Cardiology | Admitting: Cardiology

## 2022-03-11 VITALS — BP 152/92 | HR 109 | Ht 61.0 in | Wt 99.8 lb

## 2022-03-11 DIAGNOSIS — I10 Essential (primary) hypertension: Secondary | ICD-10-CM

## 2022-03-11 DIAGNOSIS — I4819 Other persistent atrial fibrillation: Secondary | ICD-10-CM

## 2022-03-11 DIAGNOSIS — I480 Paroxysmal atrial fibrillation: Secondary | ICD-10-CM

## 2022-03-11 NOTE — Patient Instructions (Addendum)
Medication Instructions:  Your physician recommends that you continue on your current medications as directed. Please refer to the Current Medication list given to you today.  *If you need a refill on your cardiac medications before your next appointment, please call your pharmacy*   Lab Work: None ordered.  If you have labs (blood work) drawn today and your tests are completely normal, you will receive your results only by: [place] (if you have MyChart) OR A paper copy in the mail If you have any lab test that is abnormal or we need to change your treatment, we will call you to review the results.   Testing/Procedures: Your physician has recommended that you have an ablation. Catheter ablation is a medical procedure used to treat some cardiac arrhythmias (irregular heartbeats). During catheter ablation, a long, thin, flexible tube is put into a blood vessel in your groin (upper thigh), or neck. This tube is called an ablation catheter. It is then guided to your heart through the blood vessel. Radio frequency waves destroy small areas of heart tissue where abnormal heartbeats may cause an arrhythmia to start. Please see the instruction sheet given to you today.    Follow-Up: At [facility], you and your health needs are our priority.  As part of our continuing mission to provide you with exceptional heart care, we have created designated Provider Care Teams.  These Care Teams include your primary Cardiologist (physician) and Advanced Practice Providers (APPs -  Physician Assistants and Nurse Practitioners) who all work together to provide you with the care you need, when you need it.  We recommend signing up for the patient portal called "MyChart".  Sign up information is provided on this After Visit Summary.  MyChart is used to connect with patients for Virtual Visits (Telemedicine).  Patients are able to view lab/test results, encounter notes, upcoming appointments, etc.   Non-urgent messages can be sent to your provider as well.   To learn more about what you can do with MyChart, go to [email or web address].    Your next appointment:   To be scheduled   Important Information About [place]

## 2022-03-11 NOTE — Progress Notes (Signed)
Electrophysiology Office Follow up Visit Note:    Date:  03/11/2022   ID:  Mackenzie [name], DOB [birth year], MRN [id number]  PCP:  Laurey Morale, MD  [place] Cardiologist:  Peter [place], MD  [facility] Electrophysiologist:  Vickie Epley, MD    Interval History:    Mackenzie [name] is a 71 y.o. female who presents for a follow up visit.  She was previously followed by Thompson Grayer, MD.  She has a history of atrial fibrillation post ablation in January 16, 2021.  She also has a history of chronic systolic heart failure, hypertension, GERD and esophageal strictures.  She continues to have symptomatic atrial fibrillation with fatigue as her main symptom.  She thinks she has been back in A-fib since September.  She takes Xarelto for stroke prophylaxis.       Past Medical History:  Diagnosis Date   Chronic systolic CHF (congestive heart failure) ([place])    a. 2D ECHO on 02/10/15 w/ severe LV dysfunction with EF 25-30%. Mild MR and mild LAE. negative nuclear stress test, felt to be due to tachycardia    Dyspnea    with exertion   Dysrhythmia    PAF   Esophageal stricture    a. requiring dilations. crushes pills to take PO   Full dentures    GERD (gastroesophageal reflux disease)    Osteoporosis    sees Dr. Carrolyn Meiers    PAF (paroxysmal atrial fibrillation) [facility])    a. s/p succesfful DCCV on 02/13/15   Pneumonia    (08/15/16)- many years ago   Sebaceous cyst    on back of neck, has seen Dr. Ronnald Collum    Past Surgical History:  Procedure Laterality Date   ATRIAL FIBRILLATION ABLATION N/A 01/16/2021   Procedure: [place];  Surgeon: Thompson Grayer, MD;  Location: [place] CV LAB;  Service: Cardiovascular;  Laterality: N/A;   BALLOON DILATION N/A 06/22/2018   Procedure: ESOPHAGEAL BALLOON DILATION;  Surgeon: Melida Quitter, MD;  Location: [place];  Service: ENT;  Laterality: N/A;   BREAST SURGERY Bilateral    Breast Implants    CARDIOVERSION N/A 02/13/2015   Procedure: CARDIOVERSION;  Surgeon: Pixie Casino, MD;  Location: [place];  Service: Cardiovascular;  Laterality: N/A;   COLONOSCOPY  09/24/2002   ESOPHAGEAL DILATION N/A 08/19/2016   Procedure: ESOPHAGEAL DILATION;  Surgeon: Melida Quitter, MD;  Location: [place];  Service: ENT;  Laterality: N/A;  esophagoscopy with balloon dilation   ESOPHAGOGASTRODUODENOSCOPY  06/01/2003   12/13   ESOPHAGOGASTRODUODENOSCOPY (EGD) WITH ESOPHAGEAL DILATION     ESOPHAGOSCOPY WITH DILITATION N/A 06/16/2017   Procedure: ESOPHAGOSCOPY WITH BALLOON DILITATION;  Surgeon: Melida Quitter, MD;  Location: [place];  Service: ENT;  Laterality: N/A;   RIGID ESOPHAGOSCOPY N/A 01/18/2013   Procedure: RIGID ESOPHAGOSCOPY WITH ESPHAGEAL Balloon DILATION ;  Surgeon: Melida Quitter, MD;  Location: [place];  Service: ENT;  Laterality: N/A;   RIGID ESOPHAGOSCOPY N/A 06/22/2018   Procedure: RIGID ESOPHAGOSCOPY;  Surgeon: Melida Quitter, MD;  Location: [place];  Service: ENT;  Laterality: N/A;   TEE WITHOUT CARDIOVERSION N/A 02/13/2015   Procedure: TRANSESOPHAGEAL ECHOCARDIOGRAM (TEE);  Surgeon: Pixie Casino, MD;  Location: [facility] ENDOSCOPY;  Service: Cardiovascular;  Laterality: N/A;    Current Medications: Current Meds  Medication Sig   chlorthalidone (HYGROTON) 25 MG tablet Take 0.5 tablets (12.5 mg total) by mouth daily.   lansoprazole (PREVACID) 30 MG capsule  Take 1 capsule (30 mg total) by mouth daily.   losartan (COZAAR) 100 MG tablet TAKE 1 TABLET BY MOUTH EVERY DAY   metoprolol tartrate (LOPRESSOR) 25 MG tablet TAKE 1/2 TABLET BY MOUTH TWICE A DAY   Rivaroxaban (XARELTO) 15 MG TABS tablet TAKE 1 TABLET (15 MG TOTAL) BY MOUTH DAILY WITH SUPPER   temazepam (RESTORIL) 30 MG capsule Take 1 capsule (30 mg total) by mouth at bedtime as needed. for sleep     Allergies:   Ramipril   Social History   Socioeconomic History   Marital status: Married     Spouse name: Not on file   Number of children: Not on file   Years of education: Not on file   Highest education level: Not on file  Occupational History   Not on file  Tobacco Use   Smoking status: Never   Smokeless tobacco: Never  Vaping Use   Vaping Use: Never used  Substance and Sexual Activity   Alcohol use: No    Alcohol/week: 0.0 standard drinks of alcohol   Drug use: No   Sexual activity: Not on file  Other Topics Concern   Not on file  Social History Narrative   Not on file   Social Determinants of Health   Financial Resource Strain: Not on file  Food Insecurity: Not on file  Transportation Needs: Not on file  Physical Activity: Not on file  Stress: Not on file  Social Connections: Not on file     Family History: The patient's family history includes Aneurysm in her father; Diabetes type II in her sister; Heart attack in her brother and brother; Heart attack (age of onset: 64) in her mother; Hypertension in her sister and sister. There is no history of Stroke.  ROS:   Please see the history of present illness.    All other systems reviewed and are negative.  EKGs/Labs/Other Studies Reviewed:    The following studies were reviewed today:    EKG:  The ekg ordered today demonstrates atrial fibrillation with rapid ventricular rates (109 bpm)  Recent Labs: No results found for requested labs within last 365 days.  Recent Lipid Panel    Component Value Date/Time   CHOL 205 (H) 02/22/2019 1117   TRIG 91 02/22/2019 1117   HDL 77 02/22/2019 1117   CHOLHDL 2.7 02/22/2019 1117   CHOLHDL 3.0 02/10/2015 0126   VLDL 12 02/10/2015 0126   LDLCALC 112 (H) 02/22/2019 1117   LDLDIRECT 121.0 03/14/2010 0931    Physical Exam:    VS:  BP (!) 152/92   Pulse (!) 109   Ht '5\' 1"'$  (1.549 m)   Wt 99 lb 12.8 oz (45.3 kg)   SpO2 98%   BMI 18.86 kg/m     Wt Readings from Last 3 Encounters:  03/11/22 99 lb 12.8 oz (45.3 kg)  09/05/21 101 lb (45.8 kg)  07/10/21  101 lb (45.8 kg)     GEN:  Well nourished, well developed in no acute distress.  Elderly, thin HEENT: Normal NECK: No JVD; No carotid bruits LYMPHATICS: No lymphadenopathy CARDIAC: Irregularly irregular, tachycardic, no murmurs, rubs, gallops RESPIRATORY:  Clear to auscultation without rales, wheezing or rhonchi  ABDOMEN: Soft, non-tender, non-distended MUSCULOSKELETAL:  No edema; No deformity  SKIN: Warm and dry NEUROLOGIC:  Alert and oriented x 3 PSYCHIATRIC:  Normal affect        ASSESSMENT:    1. PAF (paroxysmal atrial fibrillation) ([place])    PLAN:  In order of problems listed above:   #Persistent atrial fibrillation Still in symptomatic atrial fibrillation after initial ablation in October 2022.  She had a recurrence of her arrhythmia most recently in September and feels tired when out of rhythm.  She would like to pursue rhythm control.  I discussed antiarrhythmic drugs and catheter ablation.  I discussed both dofetilide and amiodarone but given her abnormal renal function, amiodarone would be her only antiarrhythmic drug option.  She would like to pursue a repeat catheter ablation first which I think is very reasonable.  I discussed the catheter ablation procedure in detail including the risks and likelihood of success.  Discussed treatment options today for their AF including antiarrhythmic drug therapy and ablation. Discussed risks, recovery and likelihood of success. Discussed potential need for repeat ablation procedures and antiarrhythmic drugs after an initial ablation. They wish to proceed with scheduling.  Risk, benefits, and alternatives to EP study and radiofrequency ablation for afib were also discussed in detail today. These risks include but are not limited to stroke, bleeding, vascular damage, tamponade, perforation, damage to the esophagus, lungs, and other structures, pulmonary vein stenosis, worsening renal function, and death. The patient understands these  risk and wishes to proceed.  We will therefore proceed with catheter ablation at the next available time.  Carto, ICE, anesthesia are requested for the procedure.  Will also obtain CT PV protocol prior to the procedure to exclude LAA thrombus and further evaluate atrial anatomy.   #Hypertension Above goal today.  Recommend checking blood pressures 1-2 times per week at home and recording the values.  Recommend bringing these recordings to the primary care physician.        Medication Adjustments/Labs and Tests Ordered: Current medicines are reviewed at length with the patient today.  Concerns regarding medicines are outlined above.  Orders Placed This Encounter  Procedures   EKG 12-Lead   No orders of the defined types were placed in this encounter.    Signed, Lars Mage, MD, [facility], [facility] 03/11/2022 9:41 PM    Electrophysiology [facility]

## 2022-03-13 ENCOUNTER — Ambulatory Visit: Payer: BC Managed Care – PPO | Attending: Cardiology | Admitting: Cardiology

## 2022-03-13 ENCOUNTER — Encounter: Payer: Self-pay | Admitting: Cardiology

## 2022-03-13 VITALS — BP 128/82 | HR 76 | Ht 61.0 in | Wt 100.6 lb

## 2022-03-13 DIAGNOSIS — G4733 Obstructive sleep apnea (adult) (pediatric): Secondary | ICD-10-CM | POA: Diagnosis not present

## 2022-03-13 DIAGNOSIS — I4819 Other persistent atrial fibrillation: Secondary | ICD-10-CM

## 2022-03-13 DIAGNOSIS — I10 Essential (primary) hypertension: Secondary | ICD-10-CM

## 2022-04-02 ENCOUNTER — Telehealth: Payer: Self-pay | Admitting: *Deleted

## 2022-04-02 ENCOUNTER — Encounter: Payer: Self-pay | Admitting: *Deleted

## 2022-04-02 NOTE — Patient Instructions (Signed)
Visit Information  Thank you for taking time to visit with me today. Please don't hesitate to contact me if I can be of assistance to you.   Following are the goals we discussed today:   Goals Addressed               This Visit's Progress     COMPLETED: No needs today (pt-stated)        Care Coordination Interventions: Reviewed medications with patient and discussed adherence with no needed refills Reviewed scheduled/upcoming provider appointments including sufficient transportation to all appointments Screening for signs and symptoms of depression related to chronic disease state  Assessed social determinant of health barriers Educated on care management services for future needs for social worker/nurse care Freight forwarder and pharmacy needs.        Please call the care guide team at [phone] if you need to cancel or reschedule your appointment.   If you are experiencing a Mental Health or [place] or need someone to talk to, please call the Suicide and Crisis Lifeline: 988  Patient verbalizes understanding of instructions and care plan provided today and agrees to view in [place]. Active MyChart status and patient understanding of how to access instructions and care plan via MyChart confirmed with patient.     No further follow up required: No follow up needs   Raina Mina, RN Care Management Coordinator [place] Office [phone]

## 2022-04-02 NOTE — Patient Outreach (Signed)
  Care Coordination   Initial Visit Note   04/02/2022 Name: ALBA PERILLO MRN: [id number] DOB: Sep 05, 1950  SKYLIN KENNERSON is a 71 y.o. year old female who sees Laurey Morale, MD for primary care. I spoke with  [name] by phone today.  What matters to the patients health and wellness today?  No needs    Goals Addressed               This Visit's Progress     COMPLETED: No needs today (pt-stated)        Care Coordination Interventions: Reviewed medications with patient and discussed adherence with no needed refills Reviewed scheduled/upcoming provider appointments including sufficient transportation to all appointments Screening for signs and symptoms of depression related to chronic disease state  Assessed social determinant of health barriers Educated on care management services for future needs for social worker/nurse care Freight forwarder and pharmacy needs.        SDOH assessments and interventions completed:  Yes  SDOH Interventions Today    Flowsheet Row Most Recent Value  SDOH Interventions   Food Insecurity Interventions Intervention Not Indicated  Housing Interventions Intervention Not Indicated  Transportation Interventions Intervention Not Indicated  Utilities Interventions Intervention Not Indicated        Care Coordination Interventions:  Yes, provided   Follow up plan: No further intervention required.   Encounter Outcome:  Pt. Visit Completed   Raina Mina, RN Care Management Coordinator [place] Office [phone]

## 2022-05-03 ENCOUNTER — Other Ambulatory Visit: Payer: Self-pay

## 2022-05-03 DIAGNOSIS — I4819 Other persistent atrial fibrillation: Secondary | ICD-10-CM

## 2022-05-21 ENCOUNTER — Ambulatory Visit: Payer: BC Managed Care – PPO | Attending: Cardiology

## 2022-05-21 DIAGNOSIS — I4819 Other persistent atrial fibrillation: Secondary | ICD-10-CM

## 2022-05-21 LAB — CBC
Hematocrit: 37.7 % (ref 34.0–46.6)
Hemoglobin: 12.3 g/dL (ref 11.1–15.9)
MCH: 28.4 pg (ref 26.6–33.0)
MCHC: 32.6 g/dL (ref 31.5–35.7)
MCV: 87 fL (ref 79–97)
Platelets: 259 10*3/uL (ref 150–450)
RBC: 4.33 x10E6/uL (ref 3.77–5.28)
RDW: 14 % (ref 11.7–15.4)
WBC: 6.1 10*3/uL (ref 3.4–10.8)

## 2022-05-21 LAB — BASIC METABOLIC PANEL
BUN/Creatinine Ratio: 22 (ref 12–28)
BUN: 28 mg/dL — ABNORMAL HIGH (ref 8–27)
CO2: 24 mmol/L (ref 20–29)
Calcium: 9.4 mg/dL (ref 8.7–10.3)
Chloride: 104 mmol/L (ref 96–106)
Creatinine, Ser: 1.28 mg/dL — ABNORMAL HIGH (ref 0.57–1.00)
Glucose: 82 mg/dL (ref 70–99)
Potassium: 4.4 mmol/L (ref 3.5–5.2)
Sodium: 139 mmol/L (ref 134–144)
eGFR: 45 mL/min/{1.73_m2} — ABNORMAL LOW (ref >59)

## 2022-05-23 ENCOUNTER — Encounter (HOSPITAL_COMMUNITY): Payer: Self-pay | Admitting: *Deleted

## 2022-05-27 ENCOUNTER — Telehealth (HOSPITAL_COMMUNITY): Payer: Self-pay | Admitting: *Deleted

## 2022-05-27 NOTE — Telephone Encounter (Signed)
Attempted to call patient regarding upcoming cardiac CT appointment. °Left message on voicemail with name and callback number ° °Hymen Arnett RN Navigator Cardiac Imaging ° Heart and Vascular Services °[phone] Office °[phone] Cell ° °

## 2022-05-28 ENCOUNTER — Ambulatory Visit (HOSPITAL_COMMUNITY)
Admission: RE | Admit: 2022-05-28 | Discharge: 2022-05-28 | Disposition: A | Discharge status: Home / Self Care | Payer: BC Managed Care – PPO | Source: Ambulatory Visit | Attending: Cardiology | Admitting: Cardiology

## 2022-05-28 DIAGNOSIS — I4819 Other persistent atrial fibrillation: Secondary | ICD-10-CM | POA: Insufficient documentation

## 2022-05-28 MED ORDER — IOHEXOL 350 MG/ML SOLN
95.0000 mL | Freq: Once | INTRAVENOUS | Status: AC | PRN
  Administered 2022-05-28: 95 mL via INTRAVENOUS

## 2022-05-31 NOTE — Pre-Procedure Instructions (Signed)
Attempted to call patient regarding procedure instructions.  Left voicemail on the following items: Arrival time 0830 Nothing to eat or drink after midnight No meds AM of procedure Responsible person to drive you home and stay with you for 24 hrs  Have you missed any doses of anti-coagulant Xarelto- if you have missed any doses please let office know right away.

## 2022-06-03 ENCOUNTER — Other Ambulatory Visit: Payer: Self-pay

## 2022-06-03 ENCOUNTER — Other Ambulatory Visit (HOSPITAL_COMMUNITY): Payer: Self-pay

## 2022-06-03 ENCOUNTER — Ambulatory Visit (HOSPITAL_COMMUNITY): Payer: BC Managed Care – PPO | Admitting: Anesthesiology

## 2022-06-03 ENCOUNTER — Encounter (HOSPITAL_COMMUNITY): Payer: Self-pay | Admitting: Cardiology

## 2022-06-03 ENCOUNTER — Ambulatory Visit (HOSPITAL_COMMUNITY)
Admission: RE | Admit: 2022-06-03 | Discharge: 2022-06-03 | Disposition: A | Discharge status: Home / Self Care | Payer: BC Managed Care – PPO | Attending: Cardiology | Admitting: Cardiology

## 2022-06-03 ENCOUNTER — Encounter (HOSPITAL_COMMUNITY)
Admission: RE | Disposition: A | Discharge status: Home / Self Care | Payer: Self-pay | Source: Home / Self Care | Attending: Cardiology

## 2022-06-03 DIAGNOSIS — I4891 Unspecified atrial fibrillation: Secondary | ICD-10-CM | POA: Diagnosis not present

## 2022-06-03 DIAGNOSIS — I110 Hypertensive heart disease with heart failure: Secondary | ICD-10-CM | POA: Diagnosis not present

## 2022-06-03 DIAGNOSIS — I5022 Chronic systolic (congestive) heart failure: Secondary | ICD-10-CM | POA: Insufficient documentation

## 2022-06-03 DIAGNOSIS — Z7901 Long term (current) use of anticoagulants: Secondary | ICD-10-CM | POA: Diagnosis not present

## 2022-06-03 DIAGNOSIS — I509 Heart failure, unspecified: Secondary | ICD-10-CM | POA: Diagnosis not present

## 2022-06-03 DIAGNOSIS — K219 Gastro-esophageal reflux disease without esophagitis: Secondary | ICD-10-CM | POA: Insufficient documentation

## 2022-06-03 DIAGNOSIS — I4819 Other persistent atrial fibrillation: Secondary | ICD-10-CM | POA: Diagnosis not present

## 2022-06-03 HISTORY — PX: ATRIAL FIBRILLATION ABLATION: EP1191

## 2022-06-03 LAB — POCT ACTIVATED CLOTTING TIME
Activated Clotting Time: 228 seconds
Activated Clotting Time: 314 seconds

## 2022-06-03 SURGERY — ATRIAL FIBRILLATION ABLATION
Anesthesia: General

## 2022-06-03 MED ORDER — DEXAMETHASONE SODIUM PHOSPHATE 4 MG/ML IJ SOLN
INTRAMUSCULAR | Status: DC | PRN
  Administered 2022-06-03: 6 mg via INTRAVENOUS

## 2022-06-03 MED ORDER — LIDOCAINE 2% (20 MG/ML) 5 ML SYRINGE
INTRAMUSCULAR | Status: DC | PRN
  Administered 2022-06-03: 4 mg via INTRAVENOUS

## 2022-06-03 MED ORDER — ACETAMINOPHEN 325 MG PO TABS: 650.0000 mg | ORAL_TABLET | ORAL | Status: DC | PRN

## 2022-06-03 MED ORDER — ONDANSETRON HCL 4 MG/2ML IJ SOLN: 4.0000 mg | Freq: Four times a day (QID) | INTRAMUSCULAR | Status: DC | PRN

## 2022-06-03 MED ORDER — PROTAMINE SULFATE 10 MG/ML IV SOLN
INTRAVENOUS | Status: DC | PRN
  Administered 2022-06-03: 10 mg via INTRAVENOUS
  Administered 2022-06-03: 15 mg via INTRAVENOUS

## 2022-06-03 MED ORDER — SUGAMMADEX SODIUM 200 MG/2ML IV SOLN
INTRAVENOUS | Status: DC | PRN
  Administered 2022-06-03: 150 mg via INTRAVENOUS

## 2022-06-03 MED ORDER — HEPARIN SODIUM (PORCINE) 1000 UNIT/ML IJ SOLN
INTRAMUSCULAR | Status: AC
  Filled 2022-06-03: qty 10

## 2022-06-03 MED ORDER — PROPOFOL 10 MG/ML IV BOLUS
INTRAVENOUS | Status: DC | PRN
  Administered 2022-06-03: 30 mg via INTRAVENOUS
  Administered 2022-06-03: 40 mg via INTRAVENOUS

## 2022-06-03 MED ORDER — SODIUM CHLORIDE 0.9% FLUSH: 3.0000 mL | Freq: Two times a day (BID) | INTRAVENOUS | Status: DC

## 2022-06-03 MED ORDER — ROCURONIUM BROMIDE 10 MG/ML (PF) SYRINGE
PREFILLED_SYRINGE | INTRAVENOUS | Status: DC | PRN
  Administered 2022-06-03: 30 mg via INTRAVENOUS

## 2022-06-03 MED ORDER — PANTOPRAZOLE SODIUM 40 MG PO PACK
40.0000 mg | PACK | Freq: Every day | ORAL | 0 refills | Status: DC
  Filled 2022-06-03 (×2): qty 30, 30d supply, fill #0

## 2022-06-03 MED ORDER — FENTANYL CITRATE (PF) 100 MCG/2ML IJ SOLN
INTRAMUSCULAR | Status: DC | PRN
  Administered 2022-06-03 (×2): 50 ug via INTRAVENOUS

## 2022-06-03 MED ORDER — ONDANSETRON HCL 4 MG/2ML IJ SOLN
INTRAMUSCULAR | Status: DC | PRN
  Administered 2022-06-03: 4 mg via INTRAVENOUS

## 2022-06-03 MED ORDER — PANTOPRAZOLE SODIUM 40 MG PO TBEC
40.0000 mg | DELAYED_RELEASE_TABLET | Freq: Every day | ORAL | Status: DC
  Filled 2022-06-03: qty 1

## 2022-06-03 MED ORDER — RIVAROXABAN 15 MG PO TABS
15.0000 mg | ORAL_TABLET | Freq: Every day | ORAL | Status: DC
  Administered 2022-06-03: 15 mg via ORAL
  Filled 2022-06-03: qty 1

## 2022-06-03 MED ORDER — HEPARIN SODIUM (PORCINE) 1000 UNIT/ML IJ SOLN
INTRAMUSCULAR | Status: DC | PRN
  Administered 2022-06-03: 4000 [IU] via INTRAVENOUS
  Administered 2022-06-03: 6000 [IU] via INTRAVENOUS

## 2022-06-03 MED ORDER — COLCHICINE 0.6 MG PO TABS
0.6000 mg | ORAL_TABLET | Freq: Two times a day (BID) | ORAL | 0 refills | Status: DC
  Filled 2022-06-03: qty 10, 5d supply, fill #0

## 2022-06-03 MED ORDER — COLCHICINE 0.6 MG PO TABS
0.6000 mg | ORAL_TABLET | Freq: Two times a day (BID) | ORAL | Status: DC
  Administered 2022-06-03: 0.6 mg via ORAL
  Filled 2022-06-03: qty 1

## 2022-06-03 MED ORDER — SODIUM CHLORIDE 0.9 % IV SOLN: INTRAVENOUS | Status: DC

## 2022-06-03 MED ORDER — SODIUM CHLORIDE 0.9 % IV SOLN: 250.0000 mL | INTRAVENOUS | Status: DC | PRN

## 2022-06-03 MED ORDER — SODIUM CHLORIDE 0.9% FLUSH: 3.0000 mL | INTRAVENOUS | Status: DC | PRN

## 2022-06-03 MED ORDER — HEPARIN (PORCINE) IN NACL 1000-0.9 UT/500ML-% IV SOLN
INTRAVENOUS | Status: DC | PRN
  Administered 2022-06-03 (×3): 500 mL

## 2022-06-03 MED ORDER — HEPARIN SODIUM (PORCINE) 1000 UNIT/ML IJ SOLN
INTRAMUSCULAR | Status: DC | PRN
  Administered 2022-06-03: 1000 [IU] via INTRAVENOUS

## 2022-06-03 MED ORDER — PHENYLEPHRINE HCL-NACL 20-0.9 MG/250ML-% IV SOLN
INTRAVENOUS | Status: DC | PRN
  Administered 2022-06-03: 25 ug/min via INTRAVENOUS

## 2022-06-03 SURGICAL SUPPLY — 18 items
BAG SNAP BAND KOVER 36X36 (MISCELLANEOUS) IMPLANT
CATH ABLAT QDOT MICRO BI TC DF (CATHETERS) IMPLANT
CATH OCTARAY 2.0 F 3-3-3-3-3 (CATHETERS) IMPLANT
CATH S-M CIRCA TEMP PROBE (CATHETERS) IMPLANT
CATH SOUNDSTAR ECO REPROCESSED (CATHETERS) IMPLANT
CATH WEB BI DIR CSDF CRV REPRO (CATHETERS) IMPLANT
CLOSURE PERCLOSE PROSTYLE (VASCULAR PRODUCTS) IMPLANT
COVER SWIFTLINK CONNECTOR (BAG) ×2 IMPLANT
PACK EP LATEX FREE (CUSTOM PROCEDURE TRAY) ×1
PACK EP LF (CUSTOM PROCEDURE TRAY) ×2 IMPLANT
PAD DEFIB RADIO PHYSIO CONN (PAD) ×2 IMPLANT
PATCH CARTO3 (PAD) IMPLANT
SHEATH BAYLIS TRANSSEPTAL 98CM (NEEDLE) IMPLANT
SHEATH CARTO VIZIGO SM CVD (SHEATH) IMPLANT
SHEATH PINNACLE 8F 10CM (SHEATH) IMPLANT
SHEATH PINNACLE 9F 10CM (SHEATH) IMPLANT
SHEATH PROBE COVER 6X72 (BAG) IMPLANT
TUBING SMART ABLATE COOLFLOW (TUBING) IMPLANT

## 2022-06-03 NOTE — H&P (Signed)
Electrophysiology Office Follow up Visit Note:     Date:  06/03/2022    ID:  Mackenzie [name], DOB Nov 08, 1950, MRN [id number]   PCP:  Laurey Morale, MD      [place] Cardiologist:  Peter Martinique, MD  [facility] Electrophysiologist:  Vickie Epley, MD      Interval History:     Mackenzie [name] is a 72 y.o. female who presents for a follow up visit.  She was previously followed by Thompson Grayer, MD.  She has a history of atrial fibrillation post ablation in January 16, 2021.  She also has a history of chronic systolic heart failure, hypertension, GERD and esophageal strictures.  She continues to have symptomatic atrial fibrillation with fatigue as her main symptom.  She thinks she has been back in A-fib since September.  She takes Xarelto for stroke prophylaxis.   Presents for PVI today.    Objective      Past Medical History:  Diagnosis Date   Chronic systolic CHF (congestive heart failure) ([place])      a. 2D ECHO on 02/10/15 w/ severe LV dysfunction with EF 25-30%. Mild MR and mild LAE. negative nuclear stress test, felt to be due to tachycardia    Dyspnea      with exertion   Dysrhythmia      PAF   Esophageal stricture      a. requiring dilations. crushes pills to take PO   Full dentures     GERD (gastroesophageal reflux disease)     Osteoporosis      sees Dr. Carrolyn Meiers    PAF (paroxysmal atrial fibrillation) [facility])      a. s/p succesfful DCCV on 02/13/15   Pneumonia      (08/15/16)- many years ago   Sebaceous cyst      on back of neck, has seen Dr. Ronnald Collum           Past Surgical History:  Procedure Laterality Date   ATRIAL FIBRILLATION ABLATION N/A 01/16/2021    Procedure: [place];  Surgeon: Thompson Grayer, MD;  Location: [place] CV LAB;  Service: Cardiovascular;  Laterality: N/A;   BALLOON DILATION N/A 06/22/2018    Procedure: ESOPHAGEAL BALLOON DILATION;  Surgeon: Melida Quitter, MD;  Location: [place];  Service:  ENT;  Laterality: N/A;   BREAST SURGERY Bilateral      Breast Implants   CARDIOVERSION N/A 02/13/2015    Procedure: CARDIOVERSION;  Surgeon: Pixie Casino, MD;  Location: [place];  Service: Cardiovascular;  Laterality: N/A;   COLONOSCOPY   09/24/2002   ESOPHAGEAL DILATION N/A 08/19/2016    Procedure: ESOPHAGEAL DILATION;  Surgeon: Melida Quitter, MD;  Location: [place];  Service: ENT;  Laterality: N/A;  esophagoscopy with balloon dilation   ESOPHAGOGASTRODUODENOSCOPY   06/01/2003    12/13   ESOPHAGOGASTRODUODENOSCOPY (EGD) WITH ESOPHAGEAL DILATION       ESOPHAGOSCOPY WITH DILITATION N/A 06/16/2017    Procedure: ESOPHAGOSCOPY WITH BALLOON DILITATION;  Surgeon: Melida Quitter, MD;  Location: [place];  Service: ENT;  Laterality: N/A;   RIGID ESOPHAGOSCOPY N/A 01/18/2013    Procedure: RIGID ESOPHAGOSCOPY WITH ESPHAGEAL Balloon DILATION ;  Surgeon: Melida Quitter, MD;  Location: [place];  Service: ENT;  Laterality: N/A;   RIGID ESOPHAGOSCOPY N/A 06/22/2018    Procedure: RIGID ESOPHAGOSCOPY;  Surgeon: Melida Quitter, MD;  Location: [place];  Service: ENT;  Laterality: N/A;   TEE WITHOUT  CARDIOVERSION N/A 02/13/2015    Procedure: TRANSESOPHAGEAL ECHOCARDIOGRAM (TEE);  Surgeon: Pixie Casino, MD;  Location: [facility] ENDOSCOPY;  Service: Cardiovascular;  Laterality: N/A;      Current Medications: Active Medications      Current Meds  Medication Sig   chlorthalidone (HYGROTON) 25 MG tablet Take 0.5 tablets (12.5 mg total) by mouth daily.   lansoprazole (PREVACID) 30 MG capsule Take 1 capsule (30 mg total) by mouth daily.   losartan (COZAAR) 100 MG tablet TAKE 1 TABLET BY MOUTH EVERY DAY   metoprolol tartrate (LOPRESSOR) 25 MG tablet TAKE 1/2 TABLET BY MOUTH TWICE A DAY   Rivaroxaban (XARELTO) 15 MG TABS tablet TAKE 1 TABLET (15 MG TOTAL) BY MOUTH DAILY WITH SUPPER   temazepam (RESTORIL) 30 MG capsule Take 1 capsule (30 mg total) by mouth at bedtime as  needed. for sleep        Allergies:   Ramipril    Social History         Socioeconomic History   Marital status: Married      Spouse name: Not on file   Number of children: Not on file   Years of education: Not on file   Highest education level: Not on file  Occupational History   Not on file  Tobacco Use   Smoking status: Never   Smokeless tobacco: Never  Vaping Use   Vaping Use: Never used  Substance and Sexual Activity   Alcohol use: No      Alcohol/week: 0.0 standard drinks of alcohol   Drug use: No   Sexual activity: Not on file  Other Topics Concern   Not on file  Social History Narrative   Not on file    Social Determinants of Health    Financial Resource Strain: Not on file  Food Insecurity: Not on file  Transportation Needs: Not on file  Physical Activity: Not on file  Stress: Not on file  Social Connections: Not on file      Family History: The patient's family history includes Aneurysm in her father; Diabetes type II in her sister; Heart attack in her brother and brother; Heart attack (age of onset: 36) in her mother; Hypertension in her sister and sister. There is no history of Stroke.   ROS:   Please see the history of present illness.    All other systems reviewed and are negative.   EKGs/Labs/Other Studies Reviewed:     The following studies were reviewed today:       EKG:  The ekg ordered today demonstrates atrial fibrillation with rapid ventricular rates (109 bpm)   Recent Labs: No results found for requested labs within last 365 days.  Recent Lipid Panel Labs (Brief)          Component Value Date/Time    CHOL 205 (H) 02/22/2019 1117    TRIG 91 02/22/2019 1117    HDL 77 02/22/2019 1117    CHOLHDL 2.7 02/22/2019 1117    CHOLHDL 3.0 02/10/2015 0126    VLDL 12 02/10/2015 0126    LDLCALC 112 (H) 02/22/2019 1117    LDLDIRECT 121.0 03/14/2010 0931        Physical Exam:     VS:  BP 168/99   Pulse 98  Ht 5' 1"$  (1.549 m)   Wt 99  lb 12.8 oz (45.3 kg)   SpO2 98%   BMI 18.86 kg/m         Wt Readings from Last 3 Encounters:  03/11/22 99  lb 12.8 oz (45.3 kg)  09/05/21 101 lb (45.8 kg)  07/10/21 101 lb (45.8 kg)      GEN:  Well nourished, well developed in no acute distress.  Elderly, thin HEENT: Normal NECK: No JVD; No carotid bruits LYMPHATICS: No lymphadenopathy CARDIAC: Irregularly irregular, tachycardic, no murmurs, rubs, gallops RESPIRATORY:  Clear to auscultation without rales, wheezing or rhonchi  ABDOMEN: Soft, non-tender, non-distended MUSCULOSKELETAL:  No edema; No deformity  SKIN: Warm and dry NEUROLOGIC:  Alert and oriented x 3 PSYCHIATRIC:  Normal affect            Assessment ASSESSMENT:     1. PAF (paroxysmal atrial fibrillation) (HCC)     PLAN:     In order of problems listed above:     #Persistent atrial fibrillation Still in symptomatic atrial fibrillation after initial ablation in October 2022.  She had a recurrence of her arrhythmia most recently in September and feels tired when out of rhythm.  She would like to pursue rhythm control.  I discussed antiarrhythmic drugs and catheter ablation.  I discussed both dofetilide and amiodarone but given her abnormal renal function, amiodarone would be her only antiarrhythmic drug option.  She would like to pursue a repeat catheter ablation first which I think is very reasonable.  I discussed the catheter ablation procedure in detail including the risks and likelihood of success.   Discussed treatment options today for their AF including antiarrhythmic drug therapy and ablation. Discussed risks, recovery and likelihood of success. Discussed potential need for repeat ablation procedures and antiarrhythmic drugs after an initial ablation. They wish to proceed with scheduling.   Risk, benefits, and alternatives to EP study and radiofrequency ablation for afib were also discussed in detail today. These risks include but are not limited to stroke,  bleeding, vascular damage, tamponade, perforation, damage to the esophagus, lungs, and other structures, pulmonary vein stenosis, worsening renal function, and death. The patient understands these risk and wishes to proceed.  We will therefore proceed with catheter ablation at the next available time.  Carto, ICE, anesthesia are requested for the procedure.  Will also obtain CT PV protocol prior to the procedure to exclude LAA thrombus and further evaluate atrial anatomy.     Presents for PVI today.l         Signed, Lars Mage, MD, [facility], [facility] 06/03/2022 Electrophysiology [facility]

## 2022-06-03 NOTE — Discharge Instructions (Signed)
Post procedure care instructions No driving for 4 days. No lifting over 5 lbs for 1 week. No vigorous or sexual activity for 1 week. You may return to work/your usual activities on 06/11/22. Keep procedure site clean & dry. If you notice increased pain, swelling, bleeding or pus, call/return!  You may shower after 24 hours, but no soaking in baths/hot tubs/pools for 1 week.    You have an appointment set up with the [facility].  Multiple studies have shown that being followed by a dedicated atrial fibrillation clinic in addition to the standard care you receive from your other physicians improves health. We believe that enrollment in the atrial fibrillation clinic will allow [place] to better care for you.   The phone number to the [facility] is [phone]. The clinic is staffed Monday through Friday from 8:30am to 5pm.  Directions: The clinic is located in the [facility], [place] the hospital at the MAIN ENTRANCE "A", use [address] to the 6th floor.  Registration desk to the right of elevators on 6th floor  If you have any trouble locating the clinic, please don't hesitate to call [phone].

## 2022-06-03 NOTE — Transfer of Care (Signed)
Immediate Anesthesia Transfer of Care Note  Patient: Mackenzie [name]  Procedure(s) Performed: ATRIAL FIBRILLATION ABLATION  Patient Location: PACU and Cath Lab  Anesthesia Type:General  Level of Consciousness: awake  Airway & Oxygen Therapy: Patient Spontanous Breathing and Patient connected to nasal cannula oxygen  Post-op Assessment: Report given to RN and Post -op Vital signs reviewed and stable  Post vital signs: Reviewed and stable  Last Vitals:  Vitals Value Taken Time  BP 151/76 06/03/22 1245  Temp 36.6 C 06/03/22 1213  Pulse 65 06/03/22 1257  Resp 15 06/03/22 1257  SpO2 98 % 06/03/22 1257  Vitals shown include unvalidated device data.  Last Pain:  Vitals:   06/03/22 1213  TempSrc: Temporal  PainSc:          Complications: No notable events documented.

## 2022-06-03 NOTE — Anesthesia Preprocedure Evaluation (Signed)
Anesthesia Evaluation  Patient identified by MRN, date of birth, ID band Patient awake    Reviewed: Allergy & Precautions, H&P , NPO status , Patient's Chart, lab work & pertinent test results  Airway Mallampati: II   Neck ROM: full    Dental   Pulmonary shortness of breath   breath sounds clear to auscultation       Cardiovascular +CHF  + dysrhythmias Atrial Fibrillation  Rhythm:irregular Rate:Normal     Neuro/Psych    GI/Hepatic ,GERD  ,,  Endo/Other    Renal/GU      Musculoskeletal   Abdominal   Peds  Hematology   Anesthesia Other Findings   Reproductive/Obstetrics                             Anesthesia Physical Anesthesia Plan  ASA: 3  Anesthesia Plan: General   Post-op Pain Management:    Induction: Intravenous  PONV Risk Score and Plan: 3 and Ondansetron, Dexamethasone and Treatment may vary due to age or medical condition  Airway Management Planned: Oral ETT  Additional Equipment:   Intra-op Plan:   Post-operative Plan: Extubation in OR  Informed Consent: I have reviewed the patients History and Physical, chart, labs and discussed the procedure including the risks, benefits and alternatives for the proposed anesthesia with the patient or authorized representative who has indicated his/her understanding and acceptance.     Dental advisory given  Plan Discussed with: CRNA, Anesthesiologist and Surgeon  Anesthesia Plan Comments:        Anesthesia Quick Evaluation

## 2022-06-03 NOTE — Anesthesia Procedure Notes (Signed)
Procedure Name: Intubation Date/Time: 06/03/2022 10:14 AM  Performed by: Lieutenant Diego, CRNAPre-anesthesia Checklist: Patient identified, Emergency Drugs available, Suction available and Patient being monitored Patient Re-evaluated:Patient Re-evaluated prior to induction Oxygen Delivery Method: Circle system utilized Preoxygenation: Pre-oxygenation with 100% oxygen Induction Type: IV induction Ventilation: Mask ventilation without difficulty Laryngoscope Size: Miller and 2 Grade View: Grade I Tube type: Oral Tube size: 6.5 mm Number of attempts: 1 Airway Equipment and Method: Stylet Placement Confirmation: ETT inserted through vocal cords under direct vision, positive ETCO2 and breath sounds checked- equal and bilateral Secured at: 22 cm Tube secured with: Tape Dental Injury: Teeth and Oropharynx as per pre-operative assessment

## 2022-06-03 NOTE — Progress Notes (Signed)
Pt ambulated to and from bathroom with no signs of oozing from bilateral groin sites

## 2022-06-04 ENCOUNTER — Encounter (HOSPITAL_COMMUNITY): Payer: Self-pay | Admitting: Cardiology

## 2022-06-04 NOTE — Anesthesia Postprocedure Evaluation (Signed)
Anesthesia Post Note  Patient: Mackenzie [name]  Procedure(s) Performed: ATRIAL FIBRILLATION ABLATION     Patient location during evaluation: PACU Anesthesia Type: General Level of consciousness: awake and alert Pain management: pain level controlled Vital Signs Assessment: post-procedure vital signs reviewed and stable Respiratory status: spontaneous breathing, nonlabored ventilation, respiratory function stable and patient connected to nasal cannula oxygen Cardiovascular status: blood pressure returned to baseline and stable Postop Assessment: no apparent nausea or vomiting Anesthetic complications: no   No notable events documented.  Last Vitals:  Vitals:   06/03/22 1630 06/03/22 1640  BP: (!) 178/84 (!) 146/96  Pulse: 71 70  Resp: 18 17  Temp:    SpO2: 99% 100%    Last Pain:  Vitals:   06/04/22 1021  TempSrc:   PainSc: 0-No pain                 Maddalyn Lutze S

## 2022-06-05 ENCOUNTER — Telehealth: Payer: Self-pay | Admitting: Cardiology

## 2022-06-05 NOTE — Telephone Encounter (Signed)
Spoke with the patient who reports severe diarrhea after starting colchicine. Advised patient to stop taking. She has also been having indigestion. She has been taking her prevacid daily. Advised to continue this and let us know if she develops any worsening indigestion or chest discomfort.

## 2022-06-05 NOTE — Telephone Encounter (Signed)
Pt c/o medication issue:  1. Name of Medication: colchicine 0.6 MG tablet   2. How are you currently taking this medication (dosage and times per day)?   3. Are you having a reaction (difficulty breathing--STAT)?   4. What is your medication issue? Pt states ever since starting this medication following her ablation she had diarrhea bad last night. She states she has also had some indigestion and she is hesitant on taking it again this morning. Please advise.

## 2022-06-15 ENCOUNTER — Other Ambulatory Visit: Payer: Self-pay | Admitting: Family Medicine

## 2022-06-15 DIAGNOSIS — K219 Gastro-esophageal reflux disease without esophagitis: Secondary | ICD-10-CM

## 2022-06-17 ENCOUNTER — Telehealth: Payer: Self-pay | Admitting: Cardiology

## 2022-06-17 MED ORDER — METOPROLOL TARTRATE 25 MG PO TABS: 25.0000 mg | ORAL_TABLET | Freq: Two times a day (BID) | ORAL | 2 refills | Status: DC

## 2022-06-17 NOTE — Telephone Encounter (Signed)
   Patient c/o Palpitations:  High priority if patient c/o lightheadedness, shortness of breath, or chest pain  How long have you had palpitations/irregular HR/ Afib? Are you having the symptoms now? Yes   Are you currently experiencing lightheadedness, SOB or CP? fatigue  Do you have a history of afib (atrial fibrillation) or irregular heart rhythm? Yes   Have you checked your BP or HR? (document readings if available): HR 109 - this morning   Are you experiencing any other symptoms? Pt said, after her ablation last 02/19 she is still feeling afib, today she feel she's in afib, she denied SOB, CP and lightheadedness, however, she's been feeing tired and no energy all day. She wants to know how long she should be feeling better after an ablation.

## 2022-06-17 NOTE — Telephone Encounter (Signed)
Pt stated she had an A-fib ablation on 2/19 and still is symptomatic. She currently feels drained,  no energy, and a-fib is worse when she lays down. Denies any other symptoms. Will forward to MD and nurse.

## 2022-06-17 NOTE — Telephone Encounter (Signed)
Pt going in and out of afib with HR ranging from 70-110. Educated pt on post ablation expectation of breakthrough afib during healing process. Per Adline Peals PA will increase metoprolol to '25mg'$  BID and call with response later in week. If still having increase in breakthrough will move up appt from 3/18. Pt in agreement.

## 2022-06-20 ENCOUNTER — Encounter: Payer: Self-pay | Admitting: Family Medicine

## 2022-06-20 ENCOUNTER — Ambulatory Visit (INDEPENDENT_AMBULATORY_CARE_PROVIDER_SITE_OTHER): Payer: BC Managed Care – PPO | Admitting: Family Medicine

## 2022-06-20 VITALS — BP 128/78 | HR 63 | Temp 98.1°F | Wt 98.6 lb

## 2022-06-20 DIAGNOSIS — Z0000 Encounter for general adult medical examination without abnormal findings: Secondary | ICD-10-CM | POA: Diagnosis not present

## 2022-06-20 DIAGNOSIS — K219 Gastro-esophageal reflux disease without esophagitis: Secondary | ICD-10-CM

## 2022-06-20 MED ORDER — LANSOPRAZOLE 30 MG PO CPDR: 30.0000 mg | DELAYED_RELEASE_CAPSULE | Freq: Every day | ORAL | 3 refills | Status: DC

## 2022-06-20 NOTE — Telephone Encounter (Signed)
Patient states she feels still in afib but heart rates are more controlled 84-96. Appt moved up to next week. Pt in agreement.

## 2022-06-20 NOTE — Progress Notes (Signed)
Subjective:    Patient ID: Mackenzie [name], female    DOB: 1950-10-22, 72 y.o.   MRN: [id number]  HPI Here for a well exam. She feels fine in general. She developed atrial fibrillation last year, and at first she could feel her heart pounding and she was SOB. No chest pain. After being treated she can still feel some occasional palpitations, but these do not bother her. Her main cardiologist is Dr. Peter Martinique, and her EP specialist is Dr. Lars Mage. She has undergone 2 ablation procedures, the last one was on 06-03-22, but these were not successful. She is scheduled to follow up with the a fib clinic on 06-25-22, with Dr. Quentin Ore on 09-11-22, and with Dr. Martinique on 11-07-22. She has not had a mammogram in many years, and her last colonoscopy was in 2004.    Review of Systems  Constitutional: Negative.   HENT: Negative.    Eyes: Negative.   Respiratory: Negative.    Cardiovascular:  Positive for palpitations.  Gastrointestinal: Negative.   Genitourinary:  Negative for decreased urine volume, difficulty urinating, dyspareunia, dysuria, enuresis, flank pain, frequency, hematuria, pelvic pain and urgency.  Musculoskeletal: Negative.   Skin: Negative.   Neurological: Negative.  Negative for headaches.  Psychiatric/Behavioral: Negative.         Objective:   Physical Exam Constitutional:      General: She is not in acute distress.    Appearance: Normal appearance. She is well-developed.  HENT:     Head: Normocephalic and atraumatic.     Right Ear: External ear normal.     Left Ear: External ear normal.     Nose: Nose normal.     Mouth/Throat:     Pharynx: No oropharyngeal exudate.  Eyes:     General: No scleral icterus.    Conjunctiva/sclera: Conjunctivae normal.     Pupils: Pupils are equal, round, and reactive to light.  Neck:     Thyroid: No thyromegaly.     Vascular: No JVD.  Cardiovascular:     Rate and Rhythm: Normal rate. Rhythm irregular.     Pulses: Normal pulses.      Heart sounds: Normal heart sounds. No murmur heard.    No friction rub. No gallop.  Pulmonary:     Effort: Pulmonary effort is normal. No respiratory distress.     Breath sounds: Normal breath sounds. No wheezing or rales.  Chest:     Chest wall: No tenderness.  Abdominal:     General: Bowel sounds are normal. There is no distension.     Palpations: Abdomen is soft. There is no mass.     Tenderness: There is no abdominal tenderness. There is no guarding or rebound.  Musculoskeletal:        General: No tenderness. Normal range of motion.     Cervical back: Normal range of motion and neck supple.  Lymphadenopathy:     Cervical: No cervical adenopathy.  Skin:    General: Skin is warm and dry.     Findings: No erythema or rash.  Neurological:     General: No focal deficit present.     Mental Status: She is alert and oriented to person, place, and time.     Cranial Nerves: No cranial nerve deficit.     Motor: No abnormal muscle tone.     Coordination: Coordination normal.     Deep Tendon Reflexes: Reflexes are normal and symmetric. Reflexes normal.  Psychiatric:  Mood and Affect: Mood normal.        Behavior: Behavior normal.        Thought Content: Thought content normal.        Judgment: Judgment normal.           Assessment & Plan:  Well exam. We discussed diet and exercise. Get fasting labs. I encouraged her to set up a mammogram, and she said she would. She declines to ever have another colonoscopy.  [name], MD

## 2022-06-25 ENCOUNTER — Encounter (HOSPITAL_COMMUNITY): Payer: Self-pay | Admitting: Physician Assistant

## 2022-06-25 ENCOUNTER — Ambulatory Visit (HOSPITAL_COMMUNITY)
Admission: RE | Admit: 2022-06-25 | Discharge: 2022-06-25 | Disposition: A | Discharge status: Home / Self Care | Payer: BC Managed Care – PPO | Source: Ambulatory Visit | Attending: Physician Assistant | Admitting: Physician Assistant

## 2022-06-25 VITALS — BP 132/90 | HR 83 | Ht 61.0 in | Wt 98.4 lb

## 2022-06-25 DIAGNOSIS — I110 Hypertensive heart disease with heart failure: Secondary | ICD-10-CM | POA: Insufficient documentation

## 2022-06-25 DIAGNOSIS — Z7901 Long term (current) use of anticoagulants: Secondary | ICD-10-CM | POA: Diagnosis not present

## 2022-06-25 DIAGNOSIS — I4819 Other persistent atrial fibrillation: Secondary | ICD-10-CM

## 2022-06-25 DIAGNOSIS — Z79899 Other long term (current) drug therapy: Secondary | ICD-10-CM | POA: Insufficient documentation

## 2022-06-25 DIAGNOSIS — I5022 Chronic systolic (congestive) heart failure: Secondary | ICD-10-CM | POA: Diagnosis not present

## 2022-06-25 DIAGNOSIS — D6869 Other thrombophilia: Secondary | ICD-10-CM | POA: Diagnosis not present

## 2022-06-25 NOTE — Progress Notes (Signed)
Primary Care Physician: Laurey Morale, MD Referring Physician: Dr. Rayann Heman Cardiologist: Dr. Martinique  Primary EP: Dr Roseanne Kaufman is a 72 y.o. female with a h/o afib s/p ablation 01/16/21. She was maintained for several years on amiodarone but afib became more persistent. 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 Latest CT scan showed bibasilar scarring. Amiodarone stopped 01/16/21.   She had ablation 01/16/21 and is in the afib clinic as she felt herself return to afib recently.   Ekg today shows SR. She feels that she went back into rhythm this am. Since the ablation  she has seen increase in BP. Her losartan and chlorthalidone had been reduced in the past as when she was in afib her BP would run very low. HTN now may be triggering afib. She has seen [id number] systolic at home. BP is 168/76 at home.   F/u in afib clinic, 05/29/21 for increased afib burden. When she saw Dr. Rayann Heman in January was describing more afib episodes but has been persistent for several weeks now. No change in health. EKG shows afib at 92 bpm. No missed anticoagulation. Amiodarone was stopped last October at time of ablation.   Follow up in the AF clinic 06/25/22. Patient was seen by Dr Quentin Ore and found to be back in persistent afib. She underwent afib ablation 06/03/22. Unfortunately, she was back in afib 2 days post ablation, confirmed on her Jodelle Red mobile device. She remains in afib today. She denies chest pain, swallowing pain, or groin issues post ablation.   Today, she denies symptoms of palpitations, chest pain, shortness of breath, orthopnea, PND, lower extremity edema, dizziness, presyncope, syncope, or neurologic sequela. The patient is tolerating medications without difficulties and is otherwise without complaint today.   Past Medical History:  Diagnosis Date   Chronic systolic CHF (congestive heart failure) ([place])    a. 2D ECHO on 02/10/15 w/ severe  LV dysfunction with EF 25-30%. Mild MR and mild LAE. negative nuclear stress test, felt to be due to tachycardia    Dyspnea    with exertion   Dysrhythmia    PAF   Esophageal stricture    a. requiring dilations. crushes pills to take PO   Full dentures    GERD (gastroesophageal reflux disease)    Osteoporosis    sees Dr. Carrolyn Meiers    PAF (paroxysmal atrial fibrillation) [facility])    a. s/p succesfful DCCV on 02/13/15   Pneumonia    (08/15/16)- many years ago   Sebaceous cyst    on back of neck, has seen Dr. Ronnald Collum   Past Surgical History:  Procedure Laterality Date   ATRIAL FIBRILLATION ABLATION N/A 01/16/2021   Procedure: [place];  Surgeon: Thompson Grayer, MD;  Location: [place] [facility];  Service: Cardiovascular;  Laterality: N/A;   ATRIAL FIBRILLATION ABLATION N/A 06/03/2022   Procedure: ATRIAL FIBRILLATION ABLATION;  Surgeon: Vickie Epley, MD;  Location: [place] [facility];  Service: Cardiovascular;  Laterality: N/A;   BALLOON DILATION N/A 06/22/2018   Procedure: ESOPHAGEAL BALLOON DILATION;  Surgeon: Melida Quitter, MD;  Location: [place];  Service: ENT;  Laterality: N/A;   BREAST SURGERY Bilateral    Breast Implants   CARDIOVERSION N/A 02/13/2015   Procedure: CARDIOVERSION;  Surgeon: Pixie Casino, MD;  Location: [place];  Service: Cardiovascular;  Laterality: N/A;   COLONOSCOPY  09/24/2002   ESOPHAGEAL DILATION N/A 08/19/2016  Procedure: ESOPHAGEAL DILATION;  Surgeon: Melida Quitter, MD;  Location: [place];  Service: ENT;  Laterality: N/A;  esophagoscopy with balloon dilation   ESOPHAGOGASTRODUODENOSCOPY  06/01/2003   12/13   ESOPHAGOGASTRODUODENOSCOPY (EGD) WITH ESOPHAGEAL DILATION     ESOPHAGOSCOPY WITH DILITATION N/A 06/16/2017   Procedure: ESOPHAGOSCOPY WITH BALLOON DILITATION;  Surgeon: Melida Quitter, MD;  Location: [place];  Service: ENT;  Laterality: N/A;   RIGID ESOPHAGOSCOPY N/A 01/18/2013   Procedure:  RIGID ESOPHAGOSCOPY WITH ESPHAGEAL Balloon DILATION ;  Surgeon: Melida Quitter, MD;  Location: [place];  Service: ENT;  Laterality: N/A;   RIGID ESOPHAGOSCOPY N/A 06/22/2018   Procedure: RIGID ESOPHAGOSCOPY;  Surgeon: Melida Quitter, MD;  Location: [place];  Service: ENT;  Laterality: N/A;   TEE WITHOUT CARDIOVERSION N/A 02/13/2015   Procedure: TRANSESOPHAGEAL ECHOCARDIOGRAM (TEE);  Surgeon: Pixie Casino, MD;  Location: [facility];  Service: Cardiovascular;  Laterality: N/A;    Current Outpatient Medications  Medication Sig Dispense Refill   chlorthalidone (HYGROTON) 25 MG tablet Take 12.5 mg by mouth daily. \     lansoprazole (PREVACID) 30 MG capsule Take 1 capsule (30 mg total) by mouth daily. 90 capsule 3   losartan (COZAAR) 100 MG tablet TAKE 1 TABLET BY MOUTH EVERY DAY 90 tablet 3   metoprolol tartrate (LOPRESSOR) 25 MG tablet Take 1 tablet (25 mg total) by mouth 2 (two) times daily. 90 tablet 2   Rivaroxaban (XARELTO) 15 MG TABS tablet TAKE 1 TABLET (15 MG TOTAL) BY MOUTH DAILY WITH SUPPER 90 tablet 1   No current facility-administered medications for this encounter.    Allergies  Allergen Reactions   Ramipril Cough    Social History   Socioeconomic History   Marital status: Married    Spouse name: Not on file   Number of children: Not on file   Years of education: Not on file   Highest education level: Not on file  Occupational History   Not on file  Tobacco Use   Smoking status: Never   Smokeless tobacco: Never   Tobacco comments:    Never smoke 06/25/22  Vaping Use   Vaping Use: Never used  Substance and Sexual Activity   Alcohol use: No    Alcohol/week: 0.0 standard drinks of alcohol   Drug use: No   Sexual activity: Not on file  Other Topics Concern   Not on file  Social History Narrative   Not on file   Social Determinants of Health   Financial Resource Strain: Not on file  Food Insecurity: No Food Insecurity  (04/02/2022)   Hunger Vital Sign    Worried About Running Out of Food in the Last Year: Never true    Ran Out of Food in the Last Year: Never true  Transportation Needs: No Transportation Needs (04/02/2022)   PRAPARE - Hydrologist (Medical): No    Lack of Transportation (Non-Medical): No  Physical Activity: Not on file  Stress: Not on file  Social Connections: Not on file  Intimate Partner Violence: Not on file    Family History  Problem Relation Age of Onset   Heart attack Mother 78       died from massive heart attack   Aneurysm Father        cerebral   Hypertension Sister    Diabetes type II Sister    Heart attack Brother    Hypertension Sister    Heart attack Brother  Stroke Neg Hx     ROS- All systems are reviewed and negative except as per the HPI above  Physical Exam: Vitals:   06/25/22 1454  BP: (!) 132/90  Pulse: 83  Weight: 44.6 kg  Height: '5\' 1"'$  (1.549 m)    Wt Readings from Last 3 Encounters:  06/25/22 44.6 kg  06/20/22 44.7 kg  06/03/22 45.4 kg    Labs: Lab Results  Component Value Date   NA 139 05/21/2022   K 4.4 05/21/2022   CL 104 05/21/2022   CO2 24 05/21/2022   GLUCOSE 82 05/21/2022   BUN 28 (H) 05/21/2022   CREATININE 1.28 (H) 05/21/2022   CALCIUM 9.4 05/21/2022   MG 1.7 02/09/2015   Lab Results  Component Value Date   INR 1.00 08/19/2016   Lab Results  Component Value Date   CHOL 205 (H) 02/22/2019   HDL 77 02/22/2019   LDLCALC 112 (H) 02/22/2019   TRIG 91 02/22/2019     GEN- The patient is a well appearing female, alert and oriented x 3 today.   HEENT-head normocephalic, atraumatic, sclera clear, conjunctiva pink, hearing intact, trachea midline. Lungs- Clear to ausculation bilaterally, normal work of breathing Heart- irregular rate and rhythm, no murmurs, rubs or gallops  GI- soft, NT, ND, + BS Extremities- no clubbing, cyanosis, or edema MS- no significant deformity or atrophy Skin- no  rash or lesion Psych- euthymic mood, full affect Neuro- strength and sensation are intact   EKG today demonstrates Afib Vent. rate 83 BPM PR interval * ms QRS duration 72 ms QT/QTcB 360/423 ms   CHA2DS2-VASc Score = 3  The patient's score is based upon: CHF History: 0 (EF normalized) HTN History: 1 Diabetes History: 0 Stroke History: 0 Vascular Disease History: 0 Age Score: 1 Gender Score: 1       ASSESSMENT AND PLAN: 1. Persistent Atrial Fibrillation (ICD10:  I48.19) The patient's CHA2DS2-VASc score is 3, indicating a 3.2% annual risk of stroke.   Amiodarone stopped 01/16/21 for lung scarring seen in the bases on CT S/p ablation 01/16/21 and 06/03/22 Patient back in persistent afib. We discussed rhythm control options including DCCV. She is not sure she wants to proceed with DCCV. I explained the rationale for DCCV despite still being in the blanking period. She will think about her options and call the clinic back by the end of the week.  Continue Xarelto 15 mg daily with no missed doses for 3 months post ablation.  Continue Lopressor 25 mg BID [name] for home monitoring.   2. Secondary Hypercoagulable State (ICD10:  D68.69) The patient is at significant risk for stroke/thromboembolism based upon her CHA2DS2-VASc Score of 3.  Continue Rivaroxaban (Xarelto).   3. HTN Stable, no changes today.   Follow up with Dr Quentin Ore as scheduled. Sooner in AF clinic pending her decision regarding DCCV.    [facility] [address] [place], [place] [address] [phone]

## 2022-06-26 ENCOUNTER — Other Ambulatory Visit (INDEPENDENT_AMBULATORY_CARE_PROVIDER_SITE_OTHER): Payer: BC Managed Care – PPO

## 2022-06-26 DIAGNOSIS — Z0000 Encounter for general adult medical examination without abnormal findings: Secondary | ICD-10-CM

## 2022-06-26 LAB — CBC WITH DIFFERENTIAL/PLATELET
Basophils Absolute: 0.1 10*3/uL (ref 0.0–0.1)
Basophils Relative: 1 % (ref 0.0–3.0)
Eosinophils Absolute: 0 10*3/uL (ref 0.0–0.7)
Eosinophils Relative: 0.8 % (ref 0.0–5.0)
HCT: 36.9 % (ref 36.0–46.0)
Hemoglobin: 12 g/dL (ref 12.0–15.0)
Lymphocytes Relative: 26.6 % (ref 12.0–46.0)
Lymphs Abs: 1.5 10*3/uL (ref 0.7–4.0)
MCHC: 32.6 g/dL (ref 30.0–36.0)
MCV: 88.7 fl (ref 78.0–100.0)
Monocytes Absolute: 0.5 10*3/uL (ref 0.1–1.0)
Monocytes Relative: 8.8 % (ref 3.0–12.0)
Neutro Abs: 3.6 10*3/uL (ref 1.4–7.7)
Neutrophils Relative %: 62.8 % (ref 43.0–77.0)
Platelets: 270 10*3/uL (ref 150.0–400.0)
RBC: 4.16 Mil/uL (ref 3.87–5.11)
RDW: 13.3 % (ref 11.5–15.5)
WBC: 5.8 10*3/uL (ref 4.0–10.5)

## 2022-06-26 LAB — LIPID PANEL
Cholesterol: 183 mg/dL (ref 0–200)
HDL: 57.4 mg/dL (ref >39.00)
LDL Cholesterol: 106 mg/dL — ABNORMAL HIGH (ref 0–99)
NonHDL: 125.77
Total CHOL/HDL Ratio: 3
Triglycerides: 98 mg/dL (ref 0.0–149.0)
VLDL: 19.6 mg/dL (ref 0.0–40.0)

## 2022-06-26 LAB — HEPATIC FUNCTION PANEL
ALT: 12 U/L (ref 0–35)
AST: 22 U/L (ref 0–37)
Albumin: 3.5 g/dL (ref 3.5–5.2)
Alkaline Phosphatase: 34 U/L — ABNORMAL LOW (ref 39–117)
Bilirubin, Direct: 0.1 mg/dL (ref 0.0–0.3)
Total Bilirubin: 0.5 mg/dL (ref 0.2–1.2)
Total Protein: 6.7 g/dL (ref 6.0–8.3)

## 2022-06-26 LAB — BASIC METABOLIC PANEL
BUN: 23 mg/dL (ref 6–23)
CO2: 28 mEq/L (ref 19–32)
Calcium: 8.9 mg/dL (ref 8.4–10.5)
Chloride: 105 mEq/L (ref 96–112)
Creatinine, Ser: 1.22 mg/dL — ABNORMAL HIGH (ref 0.40–1.20)
GFR: 44.21 mL/min — ABNORMAL LOW (ref >60.00)
Glucose, Bld: 94 mg/dL (ref 70–99)
Potassium: 3.9 mEq/L (ref 3.5–5.1)
Sodium: 140 mEq/L (ref 135–145)

## 2022-06-26 LAB — HEMOGLOBIN A1C: Hgb A1c MFr Bld: 5.8 % (ref 4.6–6.5)

## 2022-06-26 LAB — TSH: TSH: 2.53 u[IU]/mL (ref 0.35–5.50)

## 2022-07-01 ENCOUNTER — Ambulatory Visit (HOSPITAL_COMMUNITY): Payer: BC Managed Care – PPO | Admitting: Physician Assistant

## 2022-07-01 NOTE — Progress Notes (Unsigned)
Cardiology Office Note   Date:  07/02/2022   ID:  Mackenzie, [name] January 31, 1951, MRN [id number]  PCP:  Laurey Morale, MD  Cardiologist:  Lasonja Lakins Martinique, MD EP: Vickie Epley, MD  Chief Complaint  Patient presents with   Atrial Fibrillation       History of Present Illness: Mackenzie [name] is a 72 y.o. 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  She was evaluated by me on 02/24/2020 at which time she was doing okay from a cardiology standpoint.  She reported episodes of atrial fibrillation approximately once a month which would last for up to 3 days.  She had a particularly bad episode just prior to her visit with associated nausea, indigestion, and palpitations; felt very weak.  We discussed consideration for an ablation, however patient preferred watchful waiting and if more severe episodes would consider at that time.  Atrial fibrillation history dates back to 2016 when she was diagnosed after presenting with shortness of breath and palpitations.  Echo at that time showed EF 25 to 30%.  Nuclear stress test at that time showed no ischemia.  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  She underwent TEE/DCCV with successful restoration of NSR, however had recurrent A. fib within 1 month.  She was started on amiodarone at that time.  She saw Dr. Rayann Heman with electrophysiology in 2020 for consideration of an ablation though did not go forward with scheduled procedure. She had subsequent recovery in her EF with sinus rhythm. Latest CT scan showed bibasilar scarring. She was later seen by Dr Rayann Heman with recurrent Afib despite amiodarone. She had ablation 01/16/21. Amiodarone was later discontinued.   In February 2022 was seen in Afib clinic with increased Afib burden. She was scheduled for cardioversion but converted on her own. She did have a sleep study showing  mild OSA.   She reports she tried CPAP but hasn't been able to tolerate the mask.   She was seen recently by Dr Quentin Ore for her recurrent Afib. Not felt to be a good candidate for Tikosyn due to CKD. Discussed trying amiodarone versus repeat ablation.  She underwent afib ablation 06/03/22. Unfortunately, she was back in afib 2 days post ablation, confirmed on her Jodelle Red mobile device. She states she felt great for 1-2 days post procedure. She remained in Afib when seen in clinic on March 12. On follow up today she has remained in Afib on Kardiomobile device. Feels tired. Mild ankle swelling. Doesn't feel well. Has noted times where BP gets quite low.    Past Medical History:  Diagnosis Date   Chronic systolic CHF (congestive heart failure) ([place])    a. 2D ECHO on 02/10/15 w/ severe LV dysfunction with EF 25-30%. Mild MR and mild LAE. negative nuclear stress test, felt to be due to tachycardia    Dyspnea    with exertion   Dysrhythmia    PAF   Esophageal stricture    a. requiring dilations. crushes pills to take PO   Full dentures    GERD (gastroesophageal reflux disease)    Osteoporosis    sees Dr. Carrolyn Meiers    PAF (paroxysmal atrial fibrillation) [facility])    a. s/p succesfful DCCV on 02/13/15   Pneumonia    (08/15/16)- many years ago   Sebaceous cyst    on back of neck, has seen Dr. Ronnald Collum  Past Surgical History:  Procedure Laterality Date   ATRIAL FIBRILLATION ABLATION N/A 01/16/2021   Procedure: ATRIAL FIBRILLATION ABLATION;  Surgeon: Thompson Grayer, MD;  Location: [place] [facility];  Service: Cardiovascular;  Laterality: N/A;   ATRIAL FIBRILLATION ABLATION N/A 06/03/2022   Procedure: ATRIAL FIBRILLATION ABLATION;  Surgeon: Vickie Epley, MD;  Location: [place] [facility];  Service: Cardiovascular;  Laterality: N/A;   BALLOON DILATION N/A 06/22/2018   Procedure: ESOPHAGEAL BALLOON DILATION;  Surgeon: Melida Quitter, MD;  Location: [place];  Service: ENT;   Laterality: N/A;   BREAST SURGERY Bilateral    Breast Implants   CARDIOVERSION N/A 02/13/2015   Procedure: CARDIOVERSION;  Surgeon: Pixie Casino, MD;  Location: [place];  Service: Cardiovascular;  Laterality: N/A;   COLONOSCOPY  09/24/2002   ESOPHAGEAL DILATION N/A 08/19/2016   Procedure: ESOPHAGEAL DILATION;  Surgeon: Melida Quitter, MD;  Location: [place];  Service: ENT;  Laterality: N/A;  esophagoscopy with balloon dilation   ESOPHAGOGASTRODUODENOSCOPY  06/01/2003   12/13   ESOPHAGOGASTRODUODENOSCOPY (EGD) WITH ESOPHAGEAL DILATION     ESOPHAGOSCOPY WITH DILITATION N/A 06/16/2017   Procedure: ESOPHAGOSCOPY WITH BALLOON DILITATION;  Surgeon: Melida Quitter, MD;  Location: [place];  Service: ENT;  Laterality: N/A;   RIGID ESOPHAGOSCOPY N/A 01/18/2013   Procedure: RIGID ESOPHAGOSCOPY WITH ESPHAGEAL Balloon DILATION ;  Surgeon: Melida Quitter, MD;  Location: [place];  Service: ENT;  Laterality: N/A;   RIGID ESOPHAGOSCOPY N/A 06/22/2018   Procedure: RIGID ESOPHAGOSCOPY;  Surgeon: Melida Quitter, MD;  Location: [place];  Service: ENT;  Laterality: N/A;   TEE WITHOUT CARDIOVERSION N/A 02/13/2015   Procedure: TRANSESOPHAGEAL ECHOCARDIOGRAM (TEE);  Surgeon: Pixie Casino, MD;  Location: [facility] ENDOSCOPY;  Service: Cardiovascular;  Laterality: N/A;     Current Outpatient Medications  Medication Sig Dispense Refill   chlorthalidone (HYGROTON) 25 MG tablet Take 12.5 mg by mouth daily. \     lansoprazole (PREVACID) 30 MG capsule Take 1 capsule (30 mg total) by mouth daily. 90 capsule 3   losartan (COZAAR) 100 MG tablet TAKE 1 TABLET BY MOUTH EVERY DAY 90 tablet 3   metoprolol tartrate (LOPRESSOR) 25 MG tablet Take 1 tablet (25 mg total) by mouth 2 (two) times daily. 90 tablet 2   Rivaroxaban (XARELTO) 15 MG TABS tablet TAKE 1 TABLET (15 MG TOTAL) BY MOUTH DAILY WITH SUPPER 90 tablet 1   No current facility-administered medications for this visit.     Allergies:   Ramipril    Social History:  The patient  reports that she has never smoked. She has never used smokeless tobacco. She reports that she does not drink alcohol and does not use drugs.   Family History:  The patient's family history includes Aneurysm in her father; Diabetes type II in her sister; Heart attack in her brother and brother; Heart attack (age of onset: 62) in her mother; Hypertension in her sister and sister.    ROS:  Please see the history of present illness.   Otherwise, review of systems are positive for none.   All other systems are reviewed and negative.    PHYSICAL EXAM: VS:  BP 136/78   Pulse 63   Ht 5\' 1"  (1.549 m)   Wt 97 lb 9.6 oz (44.3 kg)   SpO2 100%   BMI 18.44 kg/m  , BMI Body mass index is 18.44 kg/m. GEN: Well nourished, well developed, in no acute distress HEENT: sclera anicteric Neck:  no JVD, carotid bruits, or masses Cardiac: IRRR; no murmurs, rubs, or gallops, tr edema  Respiratory:  clear to auscultation bilaterally, normal work of breathing GI: soft, nontender, nondistended, + BS MS: no deformity or atrophy Skin: warm and dry, no rash Neuro:  Strength and sensation are intact Psych: euthymic mood, full affect   EKG:  EKG is not ordered today.    Recent Labs: 06/26/2022: ALT 12; BUN 23; Creatinine, Ser 1.22; Hemoglobin 12.0; Platelets 270.0; Potassium 3.9; Sodium 140; TSH 2.53    Lipid Panel    Component Value Date/Time   CHOL 183 06/26/2022 0843   CHOL 205 (H) 02/22/2019 1117   TRIG 98.0 06/26/2022 0843   HDL 57.40 06/26/2022 0843   HDL 77 02/22/2019 1117   CHOLHDL 3 06/26/2022 0843   VLDL 19.6 06/26/2022 0843   LDLCALC 106 (H) 06/26/2022 0843   LDLCALC 112 (H) 02/22/2019 1117   LDLDIRECT 121.0 03/14/2010 0931      Wt Readings from Last 3 Encounters:  07/02/22 97 lb 9.6 oz (44.3 kg)  06/25/22 98 lb 6.4 oz (44.6 kg)  06/20/22 98 lb 9.6 oz (44.7 kg)      Other studies Reviewed: Additional studies/ records  that were reviewed today include:   Echocardiogram 2019: Study Conclusions   - Left ventricle: The cavity size was normal. Wall thickness was    normal. Systolic function was normal. The estimated ejection    fraction was in the range of 55% to 60%. Wall motion was normal;    there were no regional wall motion abnormalities. Doppler    parameters are consistent with abnormal left ventricular    relaxation (grade 2 diastolic dysfunction). The E/e&' ratio is    >15, suggesting elevated LV filling pressure.  - Mitral valve: Mildly thickened leaflets . There was trivial    regurgitation.  - Left atrium: The atrium was normal in size.  - Tricuspid valve: There was mild regurgitation.  - Pulmonary arteries: PA peak pressure: 34 mm Hg (S).  - Inferior vena cava: The vessel was normal in size. The    respirophasic diameter changes were in the normal range (>= 50%),    consistent with normal central venous pressure.   Impressions:   - Compared to a prior study in 2017, there are no significant    changes.   NST 2016: 1. No reversible ischemia or infarction. Small, mild fixed defect along the anterior apex.   2. Global hypokinesis.   3.  Decreased left ventricular ejection fraction of 21%.   4. High-risk stress test findings*.  Echo 10/27/20: IMPRESSIONS     1. Left ventricular ejection fraction, by estimation, is 60 to 65%. The  left ventricle has normal function. The left ventricle has no regional  wall motion abnormalities. There is mild asymmetric left ventricular  hypertrophy. Left ventricular diastolic  parameters are indeterminate. Elevated left ventricular end-diastolic  pressure.   2. Right ventricular systolic function is normal. The right ventricular  size is normal.   3. The mitral valve is normal in structure. Trivial mitral valve  regurgitation.   4. The aortic valve is normal in structure. Aortic valve regurgitation is  trivial.    ASSESSMENT AND  PLAN: Paroxysmal Afib- S/p Afib ablation in October 2022. - Recurrent Afib now persistent -Continue Xarelto for stroke ppx.   - Continue metoprolol for rate control - s/p repeat Ablation on Feb 19. Discussed possible repeat DCCV. She would like to wait until she sees Dr Quentin Ore in  May. If still in Afib at that time would recommend DCCV. If then recurrent Afib would need to go back on amiodarone. Tikosyn is not a good option with CKD.  - I will update Echo now.   2.  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 She has mild edema Will update Echo.  - Continue chlorthalidone, losartan, and metoprolol  3.  Labile hypertension:  - continue losartan, chlorthalidone,  and metoprolol - will reduce losartan to avoid hypotension  4.  Insomnia/daytime somnolence/snoring:  - OSA noted on sleep study - reports intolerance of CPAP      Disposition:   FU with me in 4 months  Signed, Arriyana Rodell Martinique, MD  07/02/2022 8:35 AM

## 2022-07-02 ENCOUNTER — Encounter: Payer: Self-pay | Admitting: Cardiology

## 2022-07-02 ENCOUNTER — Ambulatory Visit: Payer: BC Managed Care – PPO | Attending: Cardiology | Admitting: Cardiology

## 2022-07-02 VITALS — BP 136/78 | HR 63 | Ht 61.0 in | Wt 97.6 lb

## 2022-07-02 DIAGNOSIS — G4733 Obstructive sleep apnea (adult) (pediatric): Secondary | ICD-10-CM | POA: Diagnosis not present

## 2022-07-02 DIAGNOSIS — I10 Essential (primary) hypertension: Secondary | ICD-10-CM

## 2022-07-02 DIAGNOSIS — I5022 Chronic systolic (congestive) heart failure: Secondary | ICD-10-CM | POA: Diagnosis not present

## 2022-07-02 DIAGNOSIS — I4819 Other persistent atrial fibrillation: Secondary | ICD-10-CM

## 2022-07-02 MED ORDER — LOSARTAN POTASSIUM 100 MG PO TABS: 50.0000 mg | ORAL_TABLET | Freq: Every day | ORAL | 3 refills | Status: DC

## 2022-07-02 NOTE — Patient Instructions (Signed)
Medication Instructions:  Decrease Losartan to 50 mg daily Continue all other medications *If you need a refill on your cardiac medications before your next appointment, please call your pharmacy*   Lab Work: None ordered   Testing/Procedures: Schedule Echo   Follow-Up: At [facility], you and your health needs are our priority.  As part of our continuing mission to provide you with exceptional heart care, we have created designated Provider Care Teams.  These Care Teams include your primary Cardiologist (physician) and Advanced Practice Providers (APPs -  Physician Assistants and Nurse Practitioners) who all work together to provide you with the care you need, when you need it.  We recommend signing up for the patient portal called "MyChart".  Sign up information is provided on this After Visit Summary.  MyChart is used to connect with patients for Virtual Visits (Telemedicine).  Patients are able to view lab/test results, encounter notes, upcoming appointments, etc.  Non-urgent messages can be sent to your provider as well.   To learn more about what you can do with MyChart, go to [email or web address].    Your next appointment:  4 months    Provider:  Dr.Jordan

## 2022-07-31 ENCOUNTER — Ambulatory Visit (HOSPITAL_COMMUNITY): Payer: BC Managed Care – PPO | Attending: Cardiology

## 2022-07-31 DIAGNOSIS — I5022 Chronic systolic (congestive) heart failure: Secondary | ICD-10-CM | POA: Diagnosis not present

## 2022-07-31 DIAGNOSIS — I4819 Other persistent atrial fibrillation: Secondary | ICD-10-CM | POA: Diagnosis not present

## 2022-07-31 DIAGNOSIS — I10 Essential (primary) hypertension: Secondary | ICD-10-CM | POA: Diagnosis not present

## 2022-07-31 DIAGNOSIS — G4733 Obstructive sleep apnea (adult) (pediatric): Secondary | ICD-10-CM

## 2022-07-31 LAB — ECHOCARDIOGRAM COMPLETE
Est EF: 50
S' Lateral: 2.9 cm

## 2022-08-07 DIAGNOSIS — H52203 Unspecified astigmatism, bilateral: Secondary | ICD-10-CM | POA: Diagnosis not present

## 2022-08-07 DIAGNOSIS — Z961 Presence of intraocular lens: Secondary | ICD-10-CM | POA: Diagnosis not present

## 2022-08-07 DIAGNOSIS — H35372 Puckering of macula, left eye: Secondary | ICD-10-CM | POA: Diagnosis not present

## 2022-08-11 ENCOUNTER — Other Ambulatory Visit: Payer: Self-pay | Admitting: Cardiology

## 2022-08-11 DIAGNOSIS — I4819 Other persistent atrial fibrillation: Secondary | ICD-10-CM

## 2022-08-21 ENCOUNTER — Encounter: Payer: Self-pay | Admitting: Cardiology

## 2022-08-21 ENCOUNTER — Telehealth: Payer: Self-pay | Admitting: Cardiology

## 2022-08-21 MED ORDER — METOPROLOL TARTRATE 25 MG PO TABS: 25.0000 mg | ORAL_TABLET | Freq: Two times a day (BID) | ORAL | 1 refills | Status: DC

## 2022-08-21 NOTE — Telephone Encounter (Signed)
Refills has been sent to the pharmacy. 

## 2022-08-21 NOTE — Telephone Encounter (Signed)
*  STAT* If patient is at the pharmacy, call can be transferred to refill team.   1. Which medications need to be refilled? (please list name of each medication and dose if known)   metoprolol tartrate (LOPRESSOR) 25 MG tablet   2. Which pharmacy/location (including street and city if local pharmacy) is medication to be sent to?  [id number] - [place], [place] - [address]   3. Do they need a 30 day or 90 day supply?   90 day  Patient stated her medication was recently increased to 1 whole tablet, twice per day and will be running out of this medication.

## 2022-08-21 NOTE — Telephone Encounter (Signed)
error 

## 2022-09-10 NOTE — Progress Notes (Unsigned)
  Electrophysiology Office Follow up Visit Note:    Date:  09/10/2022   ID:  [name], [name] [birth year], MRN [id number]  PCP:  Nelwyn Salisbury, MD  [facility] Cardiologist:  Peter [name], MD  [facility] Electrophysiologist:  Lanier Prude, MD    Interval History:    Mackenzie [name] is a 72 y.o. female who presents for a follow up visit.   She had a redo catheter ablation 06/03/2022. During the procedure, all 4 PV's required reisolation. The posterior wall was also isolated.  She went back into AF several days after the ablation and remained in AF at the follow up appointment with [facility] 06/25/2022. She was hesitant to consider DCCV at that appointment. She takes Xarelto for stroke ppx.   She was previously on amiodarone. She has CKD which makes dofetilide not an ideal antiarrhythmic choice.      Past medical, surgical, social and family history were reviewed.  ROS:   Please see the history of present illness.    All other systems reviewed and are negative.  EKGs/Labs/Other Studies Reviewed:    The following studies were reviewed today:  EKG:  The ekg ordered today demonstrates ***   Physical Exam:    VS:  There were no vitals taken for this visit.    Wt Readings from Last 3 Encounters:  07/02/22 97 lb 9.6 oz (44.3 kg)  06/25/22 98 lb 6.4 oz (44.6 kg)  06/20/22 98 lb 9.6 oz (44.7 kg)     GEN: *** Well nourished, well developed in no acute distress CARDIAC: ***irregularly irregular, no murmurs, rubs, gallops RESPIRATORY:  Clear to auscultation without rales, wheezing or rhonchi       ASSESSMENT:    No diagnosis found. PLAN:    In order of problems listed above:   #Persistent AF S/p 2 prior ablations. Now back in AF. Symptomatic. Antiarrhythmic options are limited. Dofetilide not an option given CKD. She has linear scarring at the lung bases.   Plan for DCCV. If recurrence after DCCV, consider starting low dose amiodarone. I discussed  risks/benefits of DCCV and she wishes to proceed. She has not missed any doses of anticoagulant in the last 4 weeks***.  #Chronic systolic HF NYHA II-III. Warm and dry. Continue GDMT. Rhythm control indicated as above.   #Hypertension *** goal today.  Recommend checking blood pressures 1-2 times per week at home and recording the values.  Recommend bringing these recordings to the primary care physician.  Follow up 3 months w APP.  Total time spent with patient today *** minutes. This includes reviewing records, evaluating the patient and coordinating care.    Signed, Steffanie Dunn, MD, [facility], [facility] 09/10/2022 9:00 PM    Electrophysiology [facility]

## 2022-09-11 ENCOUNTER — Ambulatory Visit: Payer: BC Managed Care – PPO | Attending: Cardiology | Admitting: Cardiology

## 2022-09-11 ENCOUNTER — Encounter: Payer: Self-pay | Admitting: Cardiology

## 2022-09-11 VITALS — BP 146/82 | HR 74 | Ht 61.0 in | Wt 98.0 lb

## 2022-09-11 DIAGNOSIS — I10 Essential (primary) hypertension: Secondary | ICD-10-CM | POA: Diagnosis not present

## 2022-09-11 DIAGNOSIS — I5022 Chronic systolic (congestive) heart failure: Secondary | ICD-10-CM

## 2022-09-11 DIAGNOSIS — I4819 Other persistent atrial fibrillation: Secondary | ICD-10-CM

## 2022-09-11 NOTE — H&P (View-Only) (Signed)
Electrophysiology Office Follow up Visit Note:    Date:  09/11/2022   ID:  Tiaira L Kaeser, DOB 07/10/1950, MRN [id number]  PCP:  Fry, Stephen A, MD  [facility] Cardiologist:  Peter Jordan, MD  [facility] Electrophysiologist:  Gailene Youkhana T Leona Alen, MD    Interval History:    Mackenzie [name] is a 72 y.o. female who presents for a follow up visit.   She had a redo catheter ablation 06/03/2022. During the procedure, all 4 PV's required reisolation. The posterior wall was also isolated.  She went back into AF several days after the ablation and remained in AF at the follow up appointment with [facility] 06/25/2022. She was hesitant to consider DCCV at that appointment. She takes Xarelto for stroke ppx.   She was previously on amiodarone. She has CKD which makes dofetilide not an ideal antiarrhythmic choice.   Today, she states she is feeling the same. When she initially reverted to Afib she had been able to feel her irregular heart beats. She complains of some associated fatigue and shortness of breath, but her symptoms seem to be less severe lately. She noticed that her palpitations don't feel as strong when lying down.  Occasionally she is aware of a mild pain in her hips, possibly related to her groin incisional sites.  She endorses no missed doses of Xarelto.  She denies any chest pain, or peripheral edema. No lightheadedness, headaches, syncope, orthopnea, or PND.      Past medical, surgical, social and family history were reviewed.  ROS:   Please see the history of present illness.   (+) Palpitations (+) Shortness of breath (+) Fatigue  (+) Mild hip pain All other systems reviewed and are negative.  EKGs/Labs/Other Studies Reviewed:    The following studies were reviewed today:  EKG:  The ekg ordered today demonstrates atrial fibrillation with a ventricular rate of 76 bpm.   Physical Exam:    VS:  BP (!) 146/82   Pulse 74   Ht 5' 1" (1.549 m)   Wt 98 lb (44.5 kg)    SpO2 99%   BMI 18.52 kg/m     Wt Readings from Last 3 Encounters:  09/11/22 98 lb (44.5 kg)  07/02/22 97 lb 9.6 oz (44.3 kg)  06/25/22 98 lb 6.4 oz (44.6 kg)     GEN:  Well nourished, well developed in no acute distress CARDIAC: irregularly irregular, no murmurs, rubs, gallops RESPIRATORY:  Clear to auscultation without rales, wheezing or rhonchi       ASSESSMENT:    1. Persistent atrial fibrillation (HCC)   2. Chronic systolic CHF (congestive heart failure) (HCC)   3. Primary hypertension    PLAN:    In order of problems listed above:   #Persistent AF S/p 2 prior ablations. Now back in AF. Symptomatic. Antiarrhythmic options are limited. Dofetilide not an option given CKD. She has linear scarring at the lung bases making amiodarone not an ideal choice  Plan for DCCV. If recurrence after DCCV, consider starting low dose amiodarone. I discussed risks/benefits of DCCV and she wishes to proceed. She has not missed any doses of anticoagulant in the last 4 weeks.  If she returns to atrial fibrillation after a cardioversion, would deem her to be in permanent atrial fibrillation and focus our efforts on rate control only.  #Chronic systolic HF NYHA II-III. Warm and dry. Continue GDMT. Rhythm control indicated as above.   #Hypertension Slightly above goal today.  Recommend checking blood pressures   1-2 times per week at home and recording the values.  Recommend bringing these recordings to the primary care physician.  Follow-up in 6 to 8 weeks with APP.   I,Mathew Stumpf,acting as a scribe for Raushanah Osmundson T Keyana Guevara, MD.,have documented all relevant documentation on the behalf of Brenner Visconti T Laurelyn Terrero, MD,as directed by  Massiel Stipp T Cheryel Kyte, MD while in the presence of Zahrah Sutherlin T Timarie Labell, MD.  I, Weldon Nouri T Gwendalyn Mcgonagle, MD, have reviewed all documentation for this visit. The documentation on 09/11/22 for the exam, diagnosis, procedures, and orders are all accurate and  complete.   Signed, Aaban Griep, MD, FACC, FHRS 09/11/2022 1:52 PM    Electrophysiology [facility] 

## 2022-09-11 NOTE — Progress Notes (Signed)
Electrophysiology Office Follow up Visit Note:    Date:  09/11/2022   ID:  Mackenzie, [name] Jan 29, 1951, MRN [id number]  PCP:  Nelwyn Salisbury, MD  [facility] Cardiologist:  Peter [name], MD  [facility] Electrophysiologist:  Lanier Prude, MD    Interval History:    Mackenzie [name] is a 72 y.o. female who presents for a follow up visit.   She had a redo catheter ablation 06/03/2022. During the procedure, all 4 PV's required reisolation. The posterior wall was also isolated.  She went back into AF several days after the ablation and remained in AF at the follow up appointment with [facility] 06/25/2022. She was hesitant to consider DCCV at that appointment. She takes Xarelto for stroke ppx.   She was previously on amiodarone. She has CKD which makes dofetilide not an ideal antiarrhythmic choice.   Today, she states she is feeling the same. When she initially reverted to Afib she had been able to feel her irregular heart beats. She complains of some associated fatigue and shortness of breath, but her symptoms seem to be less severe lately. She noticed that her palpitations don't feel as strong when lying down.  Occasionally she is aware of a mild pain in her hips, possibly related to her groin incisional sites.  She endorses no missed doses of Xarelto.  She denies any chest pain, or peripheral edema. No lightheadedness, headaches, syncope, orthopnea, or PND.      Past medical, surgical, social and family history were reviewed.  ROS:   Please see the history of present illness.   (+) Palpitations (+) Shortness of breath (+) Fatigue  (+) Mild hip pain All other systems reviewed and are negative.  EKGs/Labs/Other Studies Reviewed:    The following studies were reviewed today:  EKG:  The ekg ordered today demonstrates atrial fibrillation with a ventricular rate of 76 bpm.   Physical Exam:    VS:  BP (!) 146/82   Pulse 74   Ht 5\' 1"  (1.549 m)   Wt 98 lb (44.5 kg)    SpO2 99%   BMI 18.52 kg/m     Wt Readings from Last 3 Encounters:  09/11/22 98 lb (44.5 kg)  07/02/22 97 lb 9.6 oz (44.3 kg)  06/25/22 98 lb 6.4 oz (44.6 kg)     GEN:  Well nourished, well developed in no acute distress CARDIAC: irregularly irregular, no murmurs, rubs, gallops RESPIRATORY:  Clear to auscultation without rales, wheezing or rhonchi       ASSESSMENT:    1. Persistent atrial fibrillation (HCC)   2. Chronic systolic CHF (congestive heart failure) (HCC)   3. Primary hypertension    PLAN:    In order of problems listed above:   #Persistent AF S/p 2 prior ablations. Now back in AF. Symptomatic. Antiarrhythmic options are limited. Dofetilide not an option given CKD. She has linear scarring at the lung bases making amiodarone not an ideal choice  Plan for DCCV. If recurrence after DCCV, consider starting low dose amiodarone. I discussed risks/benefits of DCCV and she wishes to proceed. She has not missed any doses of anticoagulant in the last 4 weeks.  If she returns to atrial fibrillation after a cardioversion, would deem her to be in permanent atrial fibrillation and focus our efforts on rate control only.  #Chronic systolic HF NYHA II-III. Warm and dry. Continue GDMT. Rhythm control indicated as above.   #Hypertension Slightly above goal today.  Recommend checking blood pressures  1-2 times per week at home and recording the values.  Recommend bringing these recordings to the primary care physician.  Follow-up in 6 to 8 weeks with APP.   I,Mathew Stumpf,acting as a Neurosurgeon for Lanier Prude, MD.,have documented all relevant documentation on the behalf of Lanier Prude, MD,as directed by  Lanier Prude, MD while in the presence of Lanier Prude, MD.  I, Lanier Prude, MD, have reviewed all documentation for this visit. The documentation on 09/11/22 for the exam, diagnosis, procedures, and orders are all accurate and  complete.   Signed, Steffanie Dunn, MD, [facility], [facility] 09/11/2022 1:52 PM    Electrophysiology [facility]

## 2022-09-11 NOTE — Patient Instructions (Addendum)
Medication Instructions:  Your physician recommends that you continue on your current medications as directed. Please refer to the Current Medication list given to you today.  *If you need a refill on your cardiac medications before your next appointment, please call your pharmacy*  Labs:  TODAY: BMET and CBC  Testing/Procedures: Your physician has recommended that you have a Cardioversion (DCCV). Electrical Cardioversion uses a jolt of electricity to your heart either through paddles or wired patches attached to your chest. This is a controlled, usually prescheduled, procedure. Defibrillation is done under light anesthesia in the hospital, and you usually go home the day of the procedure. This is done to get your heart back into a normal rhythm. You are not awake for the procedure. Please see the instruction sheet given to you today.  Follow-Up: At [facility], you and your health needs are our priority.  As part of our continuing mission to provide you with exceptional heart care, we have created designated Provider Care Teams.  These Care Teams include your primary Cardiologist (physician) and Advanced Practice Providers (APPs -  Physician Assistants and Nurse Practitioners) who all work together to provide you with the care you need, when you need it.  Your next appointment:   6 week(s)  Provider:   You will see one of the following Advanced Practice Providers on your designated Care Team:   Francis Dowse, [place]" [place], [place] Sherie Don, NP

## 2022-09-11 NOTE — Addendum Note (Signed)
Addended by: Frutoso Schatz on: 09/11/2022 02:03 PM   Modules accepted: Orders

## 2022-09-12 ENCOUNTER — Other Ambulatory Visit (HOSPITAL_COMMUNITY): Payer: Self-pay | Admitting: Cardiology

## 2022-09-12 LAB — CBC
Hematocrit: 37.2 % (ref 34.0–46.6)
Hemoglobin: 12 g/dL (ref 11.1–15.9)
MCH: 28.2 pg (ref 26.6–33.0)
MCHC: 32.3 g/dL (ref 31.5–35.7)
MCV: 88 fL (ref 79–97)
Platelets: 276 10*3/uL (ref 150–450)
RBC: 4.25 x10E6/uL (ref 3.77–5.28)
RDW: 12.1 % (ref 11.7–15.4)
WBC: 6.5 10*3/uL (ref 3.4–10.8)

## 2022-09-12 LAB — BASIC METABOLIC PANEL
BUN/Creatinine Ratio: 18 (ref 12–28)
BUN: 20 mg/dL (ref 8–27)
CO2: 24 mmol/L (ref 20–29)
Calcium: 9.2 mg/dL (ref 8.7–10.3)
Chloride: 103 mmol/L (ref 96–106)
Creatinine, Ser: 1.1 mg/dL — ABNORMAL HIGH (ref 0.57–1.00)
Glucose: 100 mg/dL — ABNORMAL HIGH (ref 70–99)
Potassium: 4.8 mmol/L (ref 3.5–5.2)
Sodium: 139 mmol/L (ref 134–144)
eGFR: 53 mL/min/{1.73_m2} — ABNORMAL LOW (ref >59)

## 2022-09-13 NOTE — Progress Notes (Signed)
Spoke to pt and confirmed that she has been on xarelto with no breaks. Confirmed that pt will have a ride home and a responsible adult to stay with her for 24 hours. Instructed pt to be NPO after 0000 on Sunday.

## 2022-09-15 NOTE — Anesthesia Preprocedure Evaluation (Signed)
Anesthesia Evaluation  Patient identified by MRN, date of birth, ID band Patient awake    Reviewed: Allergy & Precautions, NPO status , Patient's Chart, lab work & pertinent test results, reviewed documented beta blocker date and time   History of Anesthesia Complications Negative for: history of anesthetic complications  Airway Mallampati: II  TM Distance: >3 FB Neck ROM: Full    Dental  (+) Lower Dentures, Upper Dentures   Pulmonary neg pulmonary ROS   Pulmonary exam normal        Cardiovascular hypertension, Pt. on medications and Pt. on home beta blockers +CHF  Normal cardiovascular exam+ dysrhythmias Atrial Fibrillation   TTE 07/31/22: EF 50%, global hypokinesis, mild LVH, mild RV dysfunction, mild pHTN (RVSP 38.17mmHg), mild LAE, valves ok    Neuro/Psych negative neurological ROS     GI/Hepatic negative GI ROS, Neg liver ROS,,,  Endo/Other  negative endocrine ROS    Renal/GU negative Renal ROS  negative genitourinary   Musculoskeletal negative musculoskeletal ROS (+)    Abdominal   Peds  Hematology negative hematology ROS (+)   Anesthesia Other Findings Day of surgery medications reviewed with patient.  Reproductive/Obstetrics negative OB ROS                              Anesthesia Physical Anesthesia Plan  ASA: 3  Anesthesia Plan: General   Post-op Pain Management: Minimal or no pain anticipated   Induction: Intravenous  PONV Risk Score and Plan: 3 and Treatment may vary due to age or medical condition and Propofol infusion  Airway Management Planned: Mask  Additional Equipment: None  Intra-op Plan:   Post-operative Plan:   Informed Consent: I have reviewed the patients History and Physical, chart, labs and discussed the procedure including the risks, benefits and alternatives for the proposed anesthesia with the patient or authorized representative who has indicated  his/her understanding and acceptance.       Plan Discussed with: CRNA  Anesthesia Plan Comments:         Anesthesia Quick Evaluation

## 2022-09-16 ENCOUNTER — Other Ambulatory Visit: Payer: Self-pay

## 2022-09-16 ENCOUNTER — Encounter (HOSPITAL_COMMUNITY)
Admission: RE | Disposition: A | Discharge status: Home / Self Care | Payer: Self-pay | Source: Home / Self Care | Attending: Internal Medicine

## 2022-09-16 ENCOUNTER — Ambulatory Visit (HOSPITAL_COMMUNITY)
Admission: RE | Admit: 2022-09-16 | Discharge: 2022-09-16 | Disposition: A | Discharge status: Home / Self Care | Payer: BC Managed Care – PPO | Attending: Internal Medicine | Admitting: Internal Medicine

## 2022-09-16 ENCOUNTER — Ambulatory Visit (HOSPITAL_COMMUNITY): Payer: BC Managed Care – PPO | Admitting: Anesthesiology

## 2022-09-16 ENCOUNTER — Encounter (HOSPITAL_COMMUNITY): Payer: Self-pay | Admitting: Internal Medicine

## 2022-09-16 DIAGNOSIS — Z7901 Long term (current) use of anticoagulants: Secondary | ICD-10-CM | POA: Insufficient documentation

## 2022-09-16 DIAGNOSIS — N189 Chronic kidney disease, unspecified: Secondary | ICD-10-CM | POA: Insufficient documentation

## 2022-09-16 DIAGNOSIS — I5022 Chronic systolic (congestive) heart failure: Secondary | ICD-10-CM | POA: Diagnosis not present

## 2022-09-16 DIAGNOSIS — I4819 Other persistent atrial fibrillation: Secondary | ICD-10-CM | POA: Insufficient documentation

## 2022-09-16 DIAGNOSIS — I130 Hypertensive heart and chronic kidney disease with heart failure and stage 1 through stage 4 chronic kidney disease, or unspecified chronic kidney disease: Secondary | ICD-10-CM | POA: Insufficient documentation

## 2022-09-16 DIAGNOSIS — I509 Heart failure, unspecified: Secondary | ICD-10-CM | POA: Diagnosis not present

## 2022-09-16 DIAGNOSIS — I110 Hypertensive heart disease with heart failure: Secondary | ICD-10-CM | POA: Diagnosis not present

## 2022-09-16 DIAGNOSIS — I4891 Unspecified atrial fibrillation: Secondary | ICD-10-CM | POA: Diagnosis not present

## 2022-09-16 HISTORY — PX: CARDIOVERSION: SHX1299

## 2022-09-16 SURGERY — CARDIOVERSION
Anesthesia: General

## 2022-09-16 MED ORDER — SODIUM CHLORIDE 0.9 % IV SOLN: INTRAVENOUS | Status: DC

## 2022-09-16 MED ORDER — LIDOCAINE HCL (CARDIAC) PF 100 MG/5ML IV SOSY
PREFILLED_SYRINGE | INTRAVENOUS | Status: DC | PRN
  Administered 2022-09-16: 40 mg via INTRATRACHEAL

## 2022-09-16 MED ORDER — PROPOFOL 10 MG/ML IV BOLUS
INTRAVENOUS | Status: DC | PRN
  Administered 2022-09-16: 40 mg via INTRAVENOUS

## 2022-09-16 SURGICAL SUPPLY — 1 items: ELECT DEFIB PAD ADLT CADENCE (PAD) ×2 IMPLANT

## 2022-09-16 NOTE — Transfer of Care (Signed)
Immediate Anesthesia Transfer of Care Note  Patient: Mackenzie [name]  Procedure(s) Performed: CARDIOVERSION  Patient Location: Cath Lab  Anesthesia Type:General  Level of Consciousness: drowsy and patient cooperative  Airway & Oxygen Therapy: Patient Spontanous Breathing and Patient connected to nasal cannula oxygen  Post-op Assessment: Report given to RN and Post -op Vital signs reviewed and stable  Post vital signs: Reviewed and stable  Last Vitals:  Vitals Value Taken Time  BP    Temp    Pulse 61 09/16/22 0743  Resp 22 09/16/22 0743  SpO2 99 % 09/16/22 0743  Vitals shown include unvalidated device data.  Last Pain:  Vitals:   09/16/22 0700  TempSrc:   PainSc: 0-No pain         Complications: No notable events documented.

## 2022-09-16 NOTE — Interval H&P Note (Signed)
History and Physical Interval Note:  09/16/2022 7:29 AM  Mackenzie [name]  has presented today for surgery, with the diagnosis of afib.  The various methods of treatment have been discussed with the patient and family. After consideration of risks, benefits and other options for treatment, the patient has consented to  Procedure(s): CARDIOVERSION (N/A) as a surgical intervention.  The patient's history has been reviewed, patient examined, no change in status, stable for surgery.  I have reviewed the patient's chart and labs.  Questions were answered to the patient's satisfaction.     Hughes Wyndham A Axie Hayne

## 2022-09-16 NOTE — Anesthesia Postprocedure Evaluation (Signed)
Anesthesia Post Note  Patient: Mackenzie [name]  Procedure(s) Performed: CARDIOVERSION     Patient location during evaluation: PACU Anesthesia Type: General Level of consciousness: awake and alert Pain management: pain level controlled Vital Signs Assessment: post-procedure vital signs reviewed and stable Respiratory status: spontaneous breathing, nonlabored ventilation and respiratory function stable Cardiovascular status: blood pressure returned to baseline Postop Assessment: no apparent nausea or vomiting Anesthetic complications: no   No notable events documented.  Last Vitals:  Vitals:   09/16/22 0755 09/16/22 0800  BP: 131/83 (!) 162/73  Pulse: (!) 55 (!) 57  Resp: 17 13  Temp:    SpO2: 98% 99%    Last Pain:  Vitals:   09/16/22 0745  TempSrc: Temporal  PainSc:                  Shanda Howells

## 2022-09-16 NOTE — CV Procedure (Signed)
    Electrical Cardioversion Procedure Note Mackenzie [name] [id number] 09-21-1950  Procedure: Electrical Cardioversion Indications:  Atrial Fibrillation  Time Out: Verified patient identification, verified procedure,medications/allergies/relevent history reviewed, required imaging and test results available.  Performed  Procedure Details  The patient was NPO after midnight. Anesthesia was administered at the beside  by Dr.Howze and team.  Cardioversion was done with synchronized biphasic defibrillation with AP pads with 200 Joules.  The patient converted to sinus rhythm The patient tolerated the procedure well.  IMPRESSION:  Successful cardioversion of atrial fibrillation.   Mackenzie Lam, MD FASE [facility] Cardiologist [facility]  [address] [place], [address] [place], [place] [address] [phone]  7:43 AM

## 2022-09-17 ENCOUNTER — Encounter (HOSPITAL_COMMUNITY): Payer: Self-pay | Admitting: Internal Medicine

## 2022-09-23 ENCOUNTER — Telehealth: Payer: Self-pay | Admitting: Cardiology

## 2022-09-23 NOTE — Telephone Encounter (Signed)
HR  HR Spoke to the patient,currently in Afib since 6/3 and  mild shortness of breath since 6/8. Pt stated her bp today 0730 144/95 , HR 78    1530 136/80 HR 82. Will forward to Afib clinic,MD and nurse for advise.

## 2022-09-23 NOTE — Telephone Encounter (Signed)
Patient c/o Palpitations:  High priority if patient c/o lightheadedness, shortness of breath, or chest pain  How long have you had palpitations/irregular HR/ Afib? Are you having the symptoms now?   Yes  Are you currently experiencing lightheadedness, SOB or CP?   A little SOB yesterday  Do you have a history of afib (atrial fibrillation) or irregular heart rhythm?   Yes  Have you checked your BP or HR? (document readings if available):   7:30 am    BP 144/95   HR 78  3:30 pm   BP 136/80  HR 82  Are you experiencing any other symptoms?  No   Patient had cardioversion done last Monday and she was back in afib again this morning.

## 2022-09-25 ENCOUNTER — Encounter (HOSPITAL_COMMUNITY): Payer: Self-pay | Admitting: Physician Assistant

## 2022-09-25 ENCOUNTER — Ambulatory Visit (HOSPITAL_COMMUNITY)
Admission: RE | Admit: 2022-09-25 | Discharge: 2022-09-25 | Disposition: A | Discharge status: Home / Self Care | Payer: BC Managed Care – PPO | Source: Ambulatory Visit | Attending: Physician Assistant | Admitting: Physician Assistant

## 2022-09-25 VITALS — BP 126/82 | HR 83 | Ht 61.0 in | Wt 96.0 lb

## 2022-09-25 DIAGNOSIS — Z7902 Long term (current) use of antithrombotics/antiplatelets: Secondary | ICD-10-CM | POA: Insufficient documentation

## 2022-09-25 DIAGNOSIS — I4819 Other persistent atrial fibrillation: Secondary | ICD-10-CM | POA: Diagnosis not present

## 2022-09-25 DIAGNOSIS — Z79899 Other long term (current) drug therapy: Secondary | ICD-10-CM | POA: Diagnosis not present

## 2022-09-25 DIAGNOSIS — I10 Essential (primary) hypertension: Secondary | ICD-10-CM | POA: Insufficient documentation

## 2022-09-25 DIAGNOSIS — D6869 Other thrombophilia: Secondary | ICD-10-CM | POA: Diagnosis not present

## 2022-09-25 NOTE — Progress Notes (Signed)
Primary Care Physician: Nelwyn Salisbury, MD Referring Physician: Dr. Johney Frame Cardiologist: Dr. [name]  Primary EP: Dr Delman Cheadle is a 72 y.o. female with a h/o afib s/p ablation 01/16/21. She was maintained for several years on amiodarone but afib became more persistent. 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 Latest CT scan showed bibasilar scarring. Amiodarone stopped 01/16/21.   She had ablation 01/16/21 and is in the afib clinic as she felt herself return to afib recently.   Ekg today shows SR. She feels that she went back into rhythm this am. Since the ablation  she has seen increase in BP. Her losartan and chlorthalidone had been reduced in the past as when she was in afib her BP would run very low. HTN now may be triggering afib. She has seen 160-170 systolic at home. BP is 168/76 at home.   F/u in afib clinic, 05/29/21 for increased afib burden. When she saw Dr. Johney Frame in January was describing more afib episodes but has been persistent for several weeks now. No change in health. EKG shows afib at 92 bpm. No missed anticoagulation. Amiodarone was stopped last October at time of ablation.   Follow up in the AF clinic 06/25/22. Patient was seen by Dr Lalla Brothers and found to be back in persistent afib. She underwent afib ablation 06/03/22. Unfortunately, she was back in afib 2 days post ablation, confirmed on her [name] mobile device. She remains in afib today. She denies chest pain, swallowing pain, or groin issues post ablation.   Follow up in the AF clinic 09/25/22. Patient underwent DCCV on 10/13/22 but felt she was back in afib on 6/10. That day, she had tachypalpitations and SOB. Today, she remains in afib but is mostly unaware of her arrhythmia.   Today, she denies symptoms of chest pain, shortness of breath, orthopnea, PND, lower extremity edema, dizziness, presyncope, syncope, or neurologic sequela. The patient is tolerating  medications without difficulties and is otherwise without complaint today.   Past Medical History:  Diagnosis Date   Chronic systolic CHF (congestive heart failure) (HCC)    a. 2D ECHO on 02/10/15 w/ severe LV dysfunction with EF 25-30%. Mild MR and mild LAE. negative nuclear stress test, felt to be due to tachycardia    Dyspnea    with exertion   Dysrhythmia    PAF   Esophageal stricture    a. requiring dilations. crushes pills to take PO   Full dentures    GERD (gastroesophageal reflux disease)    Osteoporosis    sees Dr. Ardyth Harps    PAF (paroxysmal atrial fibrillation) [facility])    a. s/p succesfful DCCV on 02/13/15   Pneumonia    (08/15/16)- many years ago   Sebaceous cyst    on back of neck, has seen Dr. Bertram Savin   Past Surgical History:  Procedure Laterality Date   ATRIAL FIBRILLATION ABLATION N/A 01/16/2021   Procedure: ATRIAL FIBRILLATION ABLATION;  Surgeon: Hillis Range, MD;  Location: [facility];  Service: Cardiovascular;  Laterality: N/A;   ATRIAL FIBRILLATION ABLATION N/A 06/03/2022   Procedure: ATRIAL FIBRILLATION ABLATION;  Surgeon: Lanier Prude, MD;  Location: [facility];  Service: Cardiovascular;  Laterality: N/A;   BALLOON DILATION N/A 06/22/2018   Procedure: ESOPHAGEAL BALLOON DILATION;  Surgeon: Christia Reading, MD;  Location: [facility];  Service: ENT;  Laterality: N/A;   BREAST SURGERY Bilateral  Breast Implants   CARDIOVERSION N/A 02/13/2015   Procedure: CARDIOVERSION;  Surgeon: Chrystie Nose, MD;  Location: [facility] ENDOSCOPY;  Service: Cardiovascular;  Laterality: N/A;   CARDIOVERSION N/A 09/16/2022   Procedure: CARDIOVERSION;  Surgeon: Christell Constant, MD;  Location: [facility];  Service: Cardiovascular;  Laterality: N/A;   COLONOSCOPY  09/24/2002   ESOPHAGEAL DILATION N/A 08/19/2016   Procedure: ESOPHAGEAL DILATION;  Surgeon: Christia Reading, MD;  Location: [facility] OR;  Service: ENT;  Laterality: N/A;  esophagoscopy  with balloon dilation   ESOPHAGOGASTRODUODENOSCOPY  06/01/2003   12/13   ESOPHAGOGASTRODUODENOSCOPY (EGD) WITH ESOPHAGEAL DILATION     ESOPHAGOSCOPY WITH DILITATION N/A 06/16/2017   Procedure: ESOPHAGOSCOPY WITH BALLOON DILITATION;  Surgeon: Christia Reading, MD;  Location: [facility];  Service: ENT;  Laterality: N/A;   RIGID ESOPHAGOSCOPY N/A 01/18/2013   Procedure: RIGID ESOPHAGOSCOPY WITH ESPHAGEAL Balloon DILATION ;  Surgeon: Christia Reading, MD;  Location: [facility];  Service: ENT;  Laterality: N/A;   RIGID ESOPHAGOSCOPY N/A 06/22/2018   Procedure: RIGID ESOPHAGOSCOPY;  Surgeon: Christia Reading, MD;  Location: [facility];  Service: ENT;  Laterality: N/A;   TEE WITHOUT CARDIOVERSION N/A 02/13/2015   Procedure: TRANSESOPHAGEAL ECHOCARDIOGRAM (TEE);  Surgeon: Chrystie Nose, MD;  Location: [facility] ENDOSCOPY;  Service: Cardiovascular;  Laterality: N/A;    Current Outpatient Medications  Medication Sig Dispense Refill   chlorthalidone (HYGROTON) 25 MG tablet TAKE 1/2 TABLET BY MOUTH DAILY 45 tablet 3   lansoprazole (PREVACID) 30 MG capsule Take 1 capsule (30 mg total) by mouth daily. 90 capsule 3   losartan (COZAAR) 100 MG tablet Take 0.5 tablets (50 mg total) by mouth daily. 45 tablet 3   metoprolol tartrate (LOPRESSOR) 25 MG tablet Take 1 tablet (25 mg total) by mouth 2 (two) times daily. 180 tablet 1   XARELTO 15 MG TABS tablet TAKE 1 TABLET (15 MG TOTAL) BY MOUTH DAILY WITH SUPPER 90 tablet 1   No current facility-administered medications for this encounter.    Allergies  Allergen Reactions   Ramipril Cough    Social History   Socioeconomic History   Marital status: Married    Spouse name: Not on file   Number of children: 2   Years of education: Not on file   Highest education level: Not on file  Occupational History   Not on file  Tobacco Use   Smoking status: Never   Smokeless tobacco: Never   Tobacco comments:    Never smoke 06/25/22   Vaping Use   Vaping Use: Never used  Substance and Sexual Activity   Alcohol use: No    Alcohol/week: 0.0 standard drinks of alcohol   Drug use: No   Sexual activity: Not on file  Other Topics Concern   Not on file  Social History Narrative   Not on file   Social Determinants of Health   Financial Resource Strain: Not on file  Food Insecurity: No Food Insecurity (04/02/2022)   Hunger Vital Sign    Worried About Running Out of Food in the Last Year: Never true    Ran Out of Food in the Last Year: Never true  Transportation Needs: No Transportation Needs (04/02/2022)   PRAPARE - Administrator, Civil Service (Medical): No    Lack of Transportation (Non-Medical): No  Physical Activity: Not on file  Stress: Not on file  Social Connections: Not on file  Intimate Partner Violence: Not on file  Family History  Problem Relation Age of Onset   Heart attack Mother 71       died from massive heart attack   Aneurysm Father        cerebral   Hypertension Sister    Diabetes type II Sister    Heart attack Brother    Hypertension Sister    Heart attack Brother    Stroke Neg Hx     ROS- All systems are reviewed and negative except as per the HPI above  Physical Exam: Vitals:   09/25/22 0928  BP: 126/82  Pulse: 83  Weight: 43.5 kg  Height: 5\' 1"  (1.549 m)     Wt Readings from Last 3 Encounters:  09/25/22 43.5 kg  09/16/22 44.5 kg  09/11/22 44.5 kg    GEN- The patient is a well appearing female, alert and oriented x 3 today.   HEENT-head normocephalic, atraumatic, sclera clear, conjunctiva pink, hearing intact, trachea midline. Lungs- Clear to ausculation bilaterally, normal work of breathing Heart- irregular rate and rhythm, no murmurs, rubs or gallops  GI- soft, NT, ND, + BS Extremities- no clubbing, cyanosis, or edema MS- no significant deformity or atrophy Skin- no rash or lesion Psych- euthymic mood, full affect Neuro- strength and sensation are  intact   EKG today demonstrates Afib, NST Vent. rate 83 BPM PR interval * ms QRS duration 82 ms QT/QTcB 376/441 ms   CHA2DS2-VASc Score = 3  The patient's score is based upon: CHF History: 0 (EF normalized) HTN History: 1 Diabetes History: 0 Stroke History: 0 Vascular Disease History: 0 Age Score: 1 Gender Score: 1       ASSESSMENT AND PLAN: 1. Persistent Atrial Fibrillation (ICD10:  I48.19) The patient's CHA2DS2-VASc score is 3, indicating a 3.2% annual risk of stroke.   Amiodarone stopped 01/16/21 for lung scarring seen in the bases on CT S/p ablation 01/16/21 and 06/03/22 S/p DCCV 09/16/22 with quick return of afib.  We discussed rate vs rhythm control today. We discussed retrial of low dose amiodarone +DCCV. She would like to consider her options and discuss again at her visit on 7/16 with Otilio Saber. If she continues to have minimal symptoms, she may opt for rate control only.  Continue Xarelto 15 mg daily Continue Lopressor 25 mg BID [name] for home monitoring.   2. Secondary Hypercoagulable State (ICD10:  D68.69) The patient is at significant risk for stroke/thromboembolism based upon her CHA2DS2-VASc Score of 3.  Continue Rivaroxaban (Xarelto).   3. HTN Stable, no changes today.   Follow up with Otilio Saber as scheduled.    Jorja Loa PA-C Afib Clinic [facility] [address] [place], [place] [address] [phone]

## 2022-10-29 ENCOUNTER — Ambulatory Visit: Payer: BC Managed Care – PPO | Attending: Student | Admitting: Pulmonary Disease

## 2022-10-29 ENCOUNTER — Encounter: Payer: Self-pay | Admitting: Pulmonary Disease

## 2022-10-29 VITALS — BP 150/90 | HR 81 | Ht 61.0 in | Wt 97.4 lb

## 2022-10-29 DIAGNOSIS — I10 Essential (primary) hypertension: Secondary | ICD-10-CM | POA: Diagnosis not present

## 2022-10-29 DIAGNOSIS — I4891 Unspecified atrial fibrillation: Secondary | ICD-10-CM | POA: Diagnosis not present

## 2022-10-29 DIAGNOSIS — I4819 Other persistent atrial fibrillation: Secondary | ICD-10-CM

## 2022-10-29 DIAGNOSIS — D6869 Other thrombophilia: Secondary | ICD-10-CM

## 2022-10-29 MED ORDER — AMIODARONE HCL 200 MG PO TABS: ORAL_TABLET | ORAL | 0 refills | Status: DC

## 2022-10-29 NOTE — Progress Notes (Signed)
Electrophysiology Office Note:   Date:  10/29/2022  ID:  Mackenzie [name], DOB 12-04-1950, MRN [id number]  Primary Cardiologist: Peter [name], MD Electrophysiologist: Lanier Prude, MD      History of Present Illness:   Mackenzie [name] is a 72 y.o. female with h/o atrial fibrillation seen today for routine electrophysiology followup.   Th patient has known AF and underwent re-do catheter ablation 06/03/2022 with all 4 PV's requiring re-isolation in addition to the posterior wall. She went back into AF several days post ablation and remained in AF at follow-up 06/25/22 visit in AF Clinic. She was previously on Amiodarone, has known linear scarring at lung bases.  She has known CKD, making dofetilide not an ideal anti-arrhythmic choice. She was seen in clinic 09/11/22 by Dr. Lalla Brothers and planned for cardioversion. She underwent DCCV with 200j on 09/16/22 with thought that if she went back into AF, she would be considered permanent atrial fibrillation and transition focus to rate control. She converted to SR but noted 6/10 she had returned to AF. She was seen in the AF Clinic 6/12, at that time discussion was related to initiating amiodarone and re-trial of DCCV. She has maintained on Xarelto, lopressor and follows her rhythm with [name].    Since last being seen in our clinic the patient reports doing she does have some fatigue, difficulty with climbing stairs when AF, can feel fluttering in her chest when she is in AF.  She felt significantly better when in SR. "I felt normal".   She denies chest pain, PND, orthopnea, nausea, vomiting, dizziness, syncope, edema, weight gain, or early satiety.   Review of systems complete and found to be negative unless listed in HPI.   EP Information / Studies Reviewed:    EKG is ordered today. Personal review as below.  EKG Interpretation Date/Time:  Tuesday October 29 2022 10:25:20 EDT Ventricular Rate:  81 PR Interval:    QRS Duration:  76 QT  Interval:  378 QTC Calculation: 439 R Axis:   -24  Text Interpretation: Atrial fibrillation Minimal voltage criteria for LVH, may be normal variant ( R in aVL ) Confirmed by Canary Brim ([id number]) on 10/29/2022 10:47:46 AM   Studies:  CT Cardiac Morphology 05/28/22 > no acute cardiac abnormality, scattered descending aortic atherosclerosis  ECHO 07/31/22 > LVEF 50%, LV mildly decreased function with global hypokinesis, mild concentric LV hypertrophy, indeterminate diastolic function, RV systolic function mildly reduced, PASP 38.5, LA mildly dilated, trivial MVR   AAD Hx: -Amiodarone, stopped with linear scarring at lung bases after first ablation  -CKD precludes Tikosyn    Risk Assessment/Calculations:    CHA2DS2-VASc Score = 3   This indicates a 3.2% annual risk of stroke. The patient's score is based upon: CHF History: 0 (EF normalized) HTN History: 1 Diabetes History: 0 Stroke History: 0 Vascular Disease History: 0 Age Score: 1 Gender Score: 1    HYPERTENSION CONTROL Vitals:   10/29/22 1016 10/29/22 1128  BP: (!) 144/92 (!) 150/90    The patient's blood pressure is elevated above target today.  In order to address the patient's elevated BP: Blood pressure will be monitored at home to determine if medication changes need to be made.; The blood pressure is usually elevated in clinic.  Blood pressures monitored at home have been optimal.           Physical Exam:   VS:  BP (!) 150/90   Pulse 81   Ht 5\' 1"  (1.549 m)  Wt 97 lb 6.4 oz (44.2 kg)   SpO2 100%   BMI 18.40 kg/m    Wt Readings from Last 3 Encounters:  10/29/22 97 lb 6.4 oz (44.2 kg)  09/25/22 96 lb (43.5 kg)  09/16/22 98 lb (44.5 kg)     GEN: Well nourished, well developed in no acute distress NECK: No JVD; No carotid bruits CARDIAC: Irregularly irregular rate and rhythm, no murmurs, rubs, gallops RESPIRATORY:  Clear to auscultation without rales, wheezing or rhonchi  ABDOMEN: Soft, non-tender,  non-distended EXTREMITIES:  No edema; No deformity   ASSESSMENT AND PLAN:    Persistent Atrial Fibrillation  -load with amiodarone 200 mg BID for 10 days, then 200 mg every day -arrange DCCV  -if fails above, would be considered permanent AF  Secondary Hypercoagulable State -continue Xarelto 15mg  every day, confirmed reduced dose with Cr Cl (32 based on most recent labs)  HTN  -elevated in clinic 144-150 systolic, hold adjustments with addition of amiodarone and pending DCCV.  Pt states BP's 130's at home, reports she is higher at visits -note hx of dysphagia and difficulty swallowing, unable to take extended release versions of meds due to need to crush (consideration for rate control in future if needed). Pending repeat GI eval for strictures.   Follow up with AF Clinic post DCCV  Signed, Canary Brim, MSN, APRN, NP-C, AGACNP-BC [facility] - Electrophysiology  10/29/2022, 11:31 AM

## 2022-10-29 NOTE — Patient Instructions (Addendum)
Medication Instructions:  1.Start amiodarone 200 mg twice daily for 10 days then 200 mg daily thereafter *If you need a refill on your cardiac medications before your next appointment, please call your pharmacy*  Lab Work: BMET, CBC-one week prior to cardioversion If you have labs (blood work) drawn today and your tests are completely normal, you will receive your results only by: MyChart Message (if you have MyChart) OR A paper copy in the mail If you have any lab test that is abnormal or we need to change your treatment, we will call you to review the results.   Testing/Procedures:    Dear Mackenzie [name] are scheduled for a Cardioversion on Monday August 19 with Dr. Jacques Navy.  Please arrive at the [facility] (Main Entrance A) at [facility]: [address] [place], [place] [address] at 9:30 AM (This time is 1 hour(s) before your procedure to ensure your preparation). Free valet parking service is available. You will check in at ADMITTING. The support person will be asked to wait in the waiting room.  It is OK to have someone drop you off and come back when you are ready to be discharged.     DIET:  Nothing to eat or drink after midnight except a sip of water with medications (see medication instructions below)  MEDICATION INSTRUCTIONS: !!IF ANY NEW MEDICATIONS ARE STARTED AFTER TODAY, PLEASE NOTIFY YOUR PROVIDER AS SOON AS POSSIBLE!!  FYI: Medications such as Semaglutide (Ozempic, [place]), Tirzepatide (Mounjaro, Zepbound), Dulaglutide (Trulicity), etc ("GLP1 agonists") AND Canagliflozin (Invokana), Dapagliflozin (Farxiga), Empagliflozin (Jardiance), Ertugliflozin (Steglatro), Bexagliflozin [employer]) or any combination with one of these drugs such as Invokamet (Canagliflozin/Metformin), Synjardy (Empagliflozin/Metformin), etc ("SGLT2 inhibitors") must be held around the time of a procedure. This is not a comprehensive list of all of these drugs. Please review all of your  medications and talk to your provider if you take any one of these. If you are not sure, ask your provider.  Continue taking your anticoagulant (blood thinner): Rivaroxaban (Xarelto).  You will need to continue this after your procedure until you are told by your provider that it is safe to stop.    LABS: BMET, CBC-ONE WEEK PRIOR, HERE AT THE [address] OFFICE  FYI:  For your safety, and to allow [place] to monitor your vital signs accurately during the surgery/procedure we request: If you have artificial nails, gel coating, SNS etc, please have those removed prior to your surgery/procedure. Not having the nail coverings /polish removed may result in cancellation or delay of your surgery/procedure.  You must have a responsible person to drive you home and stay in the waiting area during your procedure. Failure to do so could result in cancellation.  Bring your insurance cards.  *Special Note: Every effort is made to have your procedure done on time. Occasionally there are emergencies that occur at the hospital that may cause delays. Please be patient if a delay does occur.       Follow-Up: At [facility], you and your health needs are our priority.  As part of our continuing mission to provide you with exceptional heart care, we have created designated Provider Care Teams.  These Care Teams include your primary Cardiologist (physician) and Advanced Practice Providers (APPs -  Physician Assistants and Nurse Practitioners) who all work together to provide you with the care you need, when you need it.  Your next appointment:   After your cardioversion.  Provider:   You will follow  up in the Atrial Fibrillation Clinic located at [facility]

## 2022-10-29 NOTE — Addendum Note (Signed)
Addended by: Valrie Hart on: 10/29/2022 11:40 AM   Modules accepted: Orders

## 2022-11-06 ENCOUNTER — Other Ambulatory Visit: Payer: Self-pay | Admitting: Pulmonary Disease

## 2022-11-07 ENCOUNTER — Ambulatory Visit: Payer: BC Managed Care – PPO | Admitting: Cardiology

## 2022-11-08 NOTE — Progress Notes (Unsigned)
Cardiology Office Note   Date:  11/08/2022   ID:  Mackenzie, [name] 01/29/1951, MRN [id number]  PCP:  Mackenzie Salisbury, MD  Cardiologist:  Avram Danielson [name], MD EP: Lanier Prude, MD  No chief complaint on file.      History of Present Illness: Mackenzie [name] is a 72 y.o. 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  She was evaluated by me on 02/24/2020 at which time she was doing okay from a cardiology standpoint.  She reported episodes of atrial fibrillation approximately once a month which would last for up to 3 days.  She had a particularly bad episode just prior to her visit with associated nausea, indigestion, and palpitations; felt very weak.  We discussed consideration for an ablation, however patient preferred watchful waiting and if more severe episodes would consider at that time.  Atrial fibrillation history dates back to 2016 when she was diagnosed after presenting with shortness of breath and palpitations.  Echo at that time showed EF 25 to 30%.  Nuclear stress test at that time showed no ischemia.  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  She underwent TEE/DCCV with successful restoration of NSR, however had recurrent A. fib within 1 month.  She was started on amiodarone at that time.  She saw Dr. Johney Frame with electrophysiology in 2020 for consideration of an ablation though did not go forward with scheduled procedure. She had subsequent recovery in her EF with sinus rhythm. Latest CT scan showed bibasilar scarring. She was later seen by Dr Johney Frame with recurrent Afib despite amiodarone. She had ablation 01/16/21. Amiodarone was later discontinued.   In February 2022 was seen in Afib clinic with increased Afib burden. She was scheduled for cardioversion but converted on her own. She did have a sleep study showing mild OSA.   She reports she tried  CPAP but hasn't been able to tolerate the mask.   She was seen  by Dr Lalla Brothers for her recurrent Afib. Not felt to be a good candidate for Tikosyn due to CKD. Discussed trying amiodarone versus repeat ablation.  She underwent afib ablation 06/03/22. Unfortunately, she was back in afib 2 days post ablation, confirmed on her [name] mobile device. She states she felt great for 1-2 days post procedure. She remained in Afib when seen in clinic on March 12. She was seen 09/11/22 by Dr. Lalla Brothers and planned for cardioversion. She underwent DCCV with 200j on 09/16/22 with thought that if she went back into AF, she would be considered permanent atrial fibrillation and transition focus to rate control. She converted to SR but noted 6/10 she had returned to AF. She was seen in the AF Clinic 6/12, at that time discussion was related to initiating amiodarone and re-trial of DCCV. She has maintained on Xarelto, lopressor and follows her rhythm with [name]. She noted feeling much better in NSR so decision was made to reload with oral amiodarone and repeat DCCV planned for August 18. Amiodarone was reloaded on July 16. The patient reports that she has been back in NSR for the past 11 days. She does note her breathing is not as good when she lies down at night and some during the day. No palpitations. No dizziness. She does complain of right arm pain and shoulder pain. This is worse with movement and worse at night.    Past Medical  History:  Diagnosis Date   Chronic systolic CHF (congestive heart failure) (HCC)    a. 2D ECHO on 02/10/15 w/ severe LV dysfunction with EF 25-30%. Mild MR and mild LAE. negative nuclear stress test, felt to be due to tachycardia    Dyspnea    with exertion   Dysrhythmia    PAF   Esophageal stricture    a. requiring dilations. crushes pills to take PO   Full dentures    GERD (gastroesophageal reflux disease)    Osteoporosis    sees Dr. Ardyth Harps    PAF (paroxysmal atrial  fibrillation) [facility])    a. s/p succesfful DCCV on 02/13/15   Pneumonia    (08/15/16)- many years ago   Sebaceous cyst    on back of neck, has seen Dr. Bertram Savin    Past Surgical History:  Procedure Laterality Date   ATRIAL FIBRILLATION ABLATION N/A 01/16/2021   Procedure: ATRIAL FIBRILLATION ABLATION;  Surgeon: Hillis Range, MD;  Location: [facility];  Service: Cardiovascular;  Laterality: N/A;   ATRIAL FIBRILLATION ABLATION N/A 06/03/2022   Procedure: ATRIAL FIBRILLATION ABLATION;  Surgeon: Lanier Prude, MD;  Location: [facility];  Service: Cardiovascular;  Laterality: N/A;   BALLOON DILATION N/A 06/22/2018   Procedure: ESOPHAGEAL BALLOON DILATION;  Surgeon: Christia Reading, MD;  Location: [facility];  Service: ENT;  Laterality: N/A;   BREAST SURGERY Bilateral    Breast Implants   CARDIOVERSION N/A 02/13/2015   Procedure: CARDIOVERSION;  Surgeon: Chrystie Nose, MD;  Location: [facility] ENDOSCOPY;  Service: Cardiovascular;  Laterality: N/A;   CARDIOVERSION N/A 09/16/2022   Procedure: CARDIOVERSION;  Surgeon: Christell Constant, MD;  Location: [facility];  Service: Cardiovascular;  Laterality: N/A;   COLONOSCOPY  09/24/2002   ESOPHAGEAL DILATION N/A 08/19/2016   Procedure: ESOPHAGEAL DILATION;  Surgeon: Christia Reading, MD;  Location: [facility] OR;  Service: ENT;  Laterality: N/A;  esophagoscopy with balloon dilation   ESOPHAGOGASTRODUODENOSCOPY  06/01/2003   12/13   ESOPHAGOGASTRODUODENOSCOPY (EGD) WITH ESOPHAGEAL DILATION     ESOPHAGOSCOPY WITH DILITATION N/A 06/16/2017   Procedure: ESOPHAGOSCOPY WITH BALLOON DILITATION;  Surgeon: Christia Reading, MD;  Location: [facility];  Service: ENT;  Laterality: N/A;   RIGID ESOPHAGOSCOPY N/A 01/18/2013   Procedure: RIGID ESOPHAGOSCOPY WITH ESPHAGEAL Balloon DILATION ;  Surgeon: Christia Reading, MD;  Location: [facility];  Service: ENT;  Laterality: N/A;   RIGID ESOPHAGOSCOPY N/A 06/22/2018    Procedure: RIGID ESOPHAGOSCOPY;  Surgeon: Christia Reading, MD;  Location: [facility];  Service: ENT;  Laterality: N/A;   TEE WITHOUT CARDIOVERSION N/A 02/13/2015   Procedure: TRANSESOPHAGEAL ECHOCARDIOGRAM (TEE);  Surgeon: Chrystie Nose, MD;  Location: [facility] ENDOSCOPY;  Service: Cardiovascular;  Laterality: N/A;     Current Outpatient Medications  Medication Sig Dispense Refill   amiodarone (PACERONE) 200 MG tablet Take one tablet by mouth twice daily for 10 days then take one tablet by mouth once daily thereafter 45 tablet 0   chlorthalidone (HYGROTON) 25 MG tablet TAKE 1/2 TABLET BY MOUTH DAILY 45 tablet 3   lansoprazole (PREVACID) 30 MG capsule Take 1 capsule (30 mg total) by mouth daily. 90 capsule 3   losartan (COZAAR) 100 MG tablet Take 0.5 tablets (50 mg total) by mouth daily. 45 tablet 3   metoprolol tartrate (LOPRESSOR) 25 MG tablet Take 1 tablet (25 mg total) by mouth 2 (two) times daily. 180 tablet 1   XARELTO 15 MG  TABS tablet TAKE 1 TABLET (15 MG TOTAL) BY MOUTH DAILY WITH SUPPER 90 tablet 1   No current facility-administered medications for this visit.    Allergies:   Ramipril    Social History:  The patient  reports that she has never smoked. She has never used smokeless tobacco. She reports that she does not drink alcohol and does not use drugs.   Family History:  The patient's family history includes Aneurysm in her father; Diabetes type II in her sister; Heart attack in her brother and brother; Heart attack (age of onset: 65) in her mother; Hypertension in her sister and sister.    ROS:  Please see the history of present illness.   Otherwise, review of systems are positive for none.   All other systems are reviewed and negative.    PHYSICAL EXAM: VS:  There were no vitals taken for this visit. , BMI There is no height or weight on file to calculate BMI. GEN: Well nourished, well developed, in no acute distress HEENT: sclera anicteric Neck: no JVD,  carotid bruits, or masses Cardiac: IRRR; no murmurs, rubs, or gallops, tr edema  Respiratory:  clear to auscultation bilaterally, normal work of breathing GI: soft, nontender, nondistended, + BS MS: no deformity or atrophy Skin: warm and dry, no rash Neuro:  Strength and sensation are intact Psych: euthymic mood, full affect   EKG Interpretation Date/Time:  Tuesday November 12 2022 10:24:06 EDT Ventricular Rate:  60 PR Interval:  222 QRS Duration:  100 QT Interval:  412 QTC Calculation: 412 R Axis:   -34  Text Interpretation: Sinus rhythm with 1st degree A-V block Left axis deviation Left ventricular hypertrophy with repolarization abnormality ( R in aVL , Cornell product , Romhilt-Estes ) When compared with ECG of 29-Oct-2022 10:25, Sinus rhythm has replaced Atrial fibrillation QRS duration has increased Non-specific change in ST segment in Anterior leads Confirmed by [name], Derral Colucci [phone]) on 11/12/2022 10:34:51 AM      Recent Labs: 06/26/2022: ALT 12; TSH 2.53 09/11/2022: BUN 20; Creatinine, Ser 1.10; Hemoglobin 12.0; Platelets 276; Potassium 4.8; Sodium 139    Lipid Panel    Component Value Date/Time   CHOL 183 06/26/2022 0843   CHOL 205 (H) 02/22/2019 1117   TRIG 98.0 06/26/2022 0843   HDL 57.40 06/26/2022 0843   HDL 77 02/22/2019 1117   CHOLHDL 3 06/26/2022 0843   VLDL 19.6 06/26/2022 0843   LDLCALC 106 (H) 06/26/2022 0843   LDLCALC 112 (H) 02/22/2019 1117   LDLDIRECT 121.0 03/14/2010 0931      Wt Readings from Last 3 Encounters:  10/29/22 97 lb 6.4 oz (44.2 kg)  09/25/22 96 lb (43.5 kg)  09/16/22 98 lb (44.5 kg)      Other studies Reviewed: Additional studies/ records that were reviewed today include:   Echocardiogram 2019: Study Conclusions   - Left ventricle: The cavity size was normal. Wall thickness was    normal. Systolic function was normal. The estimated ejection    fraction was in the range of 55% to 60%. Wall motion was normal;    there were no  regional wall motion abnormalities. Doppler    parameters are consistent with abnormal left ventricular    relaxation (grade 2 diastolic dysfunction). The E/e&' ratio is    >15, suggesting elevated LV filling pressure.  - Mitral valve: Mildly thickened leaflets . There was trivial    regurgitation.  - Left atrium: The atrium was normal in size.  - Tricuspid valve:  There was mild regurgitation.  - Pulmonary arteries: PA peak pressure: 34 mm Hg (S).  - Inferior vena cava: The vessel was normal in size. The    respirophasic diameter changes were in the normal range (>= 50%),    consistent with normal central venous pressure.   Impressions:   - Compared to a prior study in 2017, there are no significant    changes.   NST 2016: 1. No reversible ischemia or infarction. Small, mild fixed defect along the anterior apex.   2. Global hypokinesis.   3.  Decreased left ventricular ejection fraction of 21%.   4. High-risk stress test findings*.  Echo 10/27/20: IMPRESSIONS     1. Left ventricular ejection fraction, by estimation, is 60 to 65%. The  left ventricle has normal function. The left ventricle has no regional  wall motion abnormalities. There is mild asymmetric left ventricular  hypertrophy. Left ventricular diastolic  parameters are indeterminate. Elevated left ventricular end-diastolic  pressure.   2. Right ventricular systolic function is normal. The right ventricular  size is normal.   3. The mitral valve is normal in structure. Trivial mitral valve  regurgitation.   4. The aortic valve is normal in structure. Aortic valve regurgitation is  trivial.   Echo 4//17/24: IMPRESSIONS     1. Left ventricular ejection fraction, by estimation, is 50%. The left  ventricle has mildly decreased function. The left ventricle demonstrates  global hypokinesis. There is mild concentric left ventricular hypertrophy.  Left ventricular diastolic  parameters are indeterminate.   2. Right  ventricular systolic function is mildly reduced. The right  ventricular size is normal. There is mildly elevated pulmonary artery  systolic pressure. The estimated right ventricular systolic pressure is  38.5 mmHg.   3. Left atrial size was mildly dilated.   4. The mitral valve is normal in structure. Trivial mitral valve  regurgitation. No evidence of mitral stenosis.   5. The aortic valve is tricuspid. Aortic valve regurgitation is not  visualized. No aortic stenosis is present.   6. The inferior vena cava is dilated in size with >50% respiratory  variability, suggesting right atrial pressure of 8 mmHg.   7. The patient was in atrial fibrillation.   ASSESSMENT AND PLAN: Paroxysmal Afib- S/p Afib ablation in October 2022. - Recurrent Afib  -Continue Xarelto for stroke ppx.   - Continue metoprolol for rate control - s/p repeat Ablation on Feb 19. Repeat DCCV in June but returned to Afib. Now on amiodarone for past 2 weeks and has converted back to NSR - continue amiodarone 200 mg daily - follow up in Afib clinic end of August. Will cancel DCCV.    2.  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  - most recent Echo with EF 50%  - Continue chlorthalidone, losartan, and metoprolol  3.  Labile hypertension:  - continue losartan, chlorthalidone,  and metoprolol - reports BP at home has been good. 130/80 today  4.  Insomnia/daytime somnolence/snoring:  - OSA noted on sleep study - reports intolerance of CPAP  5.  Right arm/shoulder pain. History c/w musculoskeletal pain. Follow up with PCP      Disposition:   FU with me in 6 months  Signed, Ellyanna Holton [name], MD  11/08/2022 3:24 PM

## 2022-11-11 ENCOUNTER — Ambulatory Visit: Payer: BC Managed Care – PPO | Admitting: Family Medicine

## 2022-11-11 ENCOUNTER — Telehealth: Payer: Self-pay | Admitting: Internal Medicine

## 2022-11-11 NOTE — Telephone Encounter (Signed)
Patient states she is no longer in afib and she would like to know if cardioversion is still necessary. Please advise.

## 2022-11-11 NOTE — Telephone Encounter (Signed)
Patient states she started amiodarone on 7/16 after visit with [facility] NP.  She is out of AFIB and wanted to know if you think she will still need cardioversion.

## 2022-11-12 ENCOUNTER — Encounter: Payer: Self-pay | Admitting: Family Medicine

## 2022-11-12 ENCOUNTER — Encounter: Payer: Self-pay | Admitting: Cardiology

## 2022-11-12 ENCOUNTER — Ambulatory Visit: Payer: BC Managed Care – PPO | Admitting: Family Medicine

## 2022-11-12 ENCOUNTER — Ambulatory Visit: Payer: BC Managed Care – PPO | Attending: Cardiology | Admitting: Cardiology

## 2022-11-12 VITALS — BP 150/82 | HR 64 | Ht 61.0 in | Wt 97.0 lb

## 2022-11-12 VITALS — BP 128/74 | HR 60 | Temp 98.3°F | Wt 98.4 lb

## 2022-11-12 DIAGNOSIS — R0989 Other specified symptoms and signs involving the circulatory and respiratory systems: Secondary | ICD-10-CM | POA: Diagnosis not present

## 2022-11-12 DIAGNOSIS — R000 Tachycardia, unspecified: Secondary | ICD-10-CM

## 2022-11-12 DIAGNOSIS — I43 Cardiomyopathy in diseases classified elsewhere: Secondary | ICD-10-CM

## 2022-11-12 DIAGNOSIS — R0789 Other chest pain: Secondary | ICD-10-CM

## 2022-11-12 DIAGNOSIS — R079 Chest pain, unspecified: Secondary | ICD-10-CM

## 2022-11-12 DIAGNOSIS — I428 Other cardiomyopathies: Secondary | ICD-10-CM

## 2022-11-12 DIAGNOSIS — I480 Paroxysmal atrial fibrillation: Secondary | ICD-10-CM | POA: Diagnosis not present

## 2022-11-12 MED ORDER — METHYLPREDNISOLONE 4 MG PO TBPK: ORAL_TABLET | ORAL | 0 refills | Status: DC

## 2022-11-12 NOTE — Patient Instructions (Signed)
Medication Instructions:  Continue same medications *If you need a refill on your cardiac medications before your next appointment, please call your pharmacy*   Lab Work: None ordered   Testing/Procedures: Cardioversion will be cancelled   Follow-Up: At [facility], you and your health needs are our priority.  As part of our continuing mission to provide you with exceptional heart care, we have created designated Provider Care Teams.  These Care Teams include your primary Cardiologist (physician) and Advanced Practice Providers (APPs -  Physician Assistants and Nurse Practitioners) who all work together to provide you with the care you need, when you need it.  We recommend signing up for the patient portal called "MyChart".  Sign up information is provided on this After Visit Summary.  MyChart is used to connect with patients for Virtual Visits (Telemedicine).  Patients are able to view lab/test results, encounter notes, upcoming appointments, etc.  Non-urgent messages can be sent to your provider as well.   To learn more about what you can do with MyChart, go to [email or web address].    Your next appointment:  6 months    Call in Sept to schedule Jan appointment     Provider:  Dr.Jordan

## 2022-11-12 NOTE — Progress Notes (Signed)
   Subjective:    Patient ID: Mackenzie [name], female    DOB: 01/13/1951, 72 y.o.   MRN: [id number]  HPI Here for 5 days of sharp pain in the right chest and right axilla. Sometimes the pain radiates into the right upper arm. No numbness or weakness in the arm. This started as she was bending down and twisting to the side to pick something up off the floor. No SOB. She has applied BioFlex and applied heat.    Review of Systems  Constitutional: Negative.   Respiratory: Negative.    Cardiovascular:  Positive for chest pain. Negative for palpitations and leg swelling.  Gastrointestinal: Negative.   Musculoskeletal:  Positive for arthralgias.  Neurological:  Negative for weakness and numbness.       Objective:   Physical Exam Constitutional:      General: She is not in acute distress.    Appearance: Normal appearance.  Cardiovascular:     Rate and Rhythm: Normal rate and regular rhythm.     Pulses: Normal pulses.     Heart sounds: Normal heart sounds.  Pulmonary:     Effort: Pulmonary effort is normal.     Breath sounds: Normal breath sounds.  Musculoskeletal:     Comments: She is tender across the right upper chest area and into the right axilla. No lumps are felt. The shoulder is normal with no tenderness and full ROM.   Neurological:     Mental Status: She is alert.           Assessment & Plan:  Chest wall muscle strain. Treat with a Medrol dose pack. Recheck if not better by next week.  Gershon Crane, MD

## 2022-11-26 ENCOUNTER — Ambulatory Visit: Payer: BC Managed Care – PPO

## 2022-11-27 ENCOUNTER — Other Ambulatory Visit: Payer: Self-pay

## 2022-11-27 MED ORDER — AMIODARONE HCL 200 MG PO TABS: 200.0000 mg | ORAL_TABLET | Freq: Every day | ORAL | 3 refills | Status: DC

## 2022-11-27 NOTE — Telephone Encounter (Signed)
Pt's medication was sent to pt's pharmacy as requested. Confirmation received.  °

## 2022-12-02 ENCOUNTER — Ambulatory Visit (HOSPITAL_COMMUNITY): Admit: 2022-12-02 | Payer: BC Managed Care – PPO | Admitting: Internal Medicine

## 2022-12-02 ENCOUNTER — Encounter (HOSPITAL_COMMUNITY): Payer: Self-pay

## 2022-12-02 SURGERY — CARDIOVERSION
Anesthesia: General

## 2022-12-10 DIAGNOSIS — K222 Esophageal obstruction: Secondary | ICD-10-CM | POA: Diagnosis not present

## 2022-12-10 DIAGNOSIS — R1319 Other dysphagia: Secondary | ICD-10-CM | POA: Diagnosis not present

## 2022-12-11 ENCOUNTER — Ambulatory Visit (HOSPITAL_COMMUNITY): Payer: BC Managed Care – PPO | Admitting: Physician Assistant

## 2022-12-24 ENCOUNTER — Ambulatory Visit (HOSPITAL_COMMUNITY): Payer: BC Managed Care – PPO | Admitting: Physician Assistant

## 2022-12-31 ENCOUNTER — Ambulatory Visit (HOSPITAL_COMMUNITY): Payer: BC Managed Care – PPO | Admitting: Physician Assistant

## 2023-01-04 ENCOUNTER — Other Ambulatory Visit: Payer: Self-pay | Admitting: Cardiology

## 2023-01-17 ENCOUNTER — Other Ambulatory Visit: Payer: Self-pay | Admitting: Family Medicine

## 2023-01-17 DIAGNOSIS — Z1211 Encounter for screening for malignant neoplasm of colon: Secondary | ICD-10-CM

## 2023-01-17 DIAGNOSIS — Z1212 Encounter for screening for malignant neoplasm of rectum: Secondary | ICD-10-CM

## 2023-01-24 ENCOUNTER — Telehealth: Payer: Self-pay

## 2023-01-24 ENCOUNTER — Telehealth: Payer: Self-pay | Admitting: Cardiology

## 2023-01-24 NOTE — Telephone Encounter (Signed)
Patient with diagnosis of afib on Xarelto for anticoagulation.    Procedure:  Rigid Esophagoscopy with balloon dialation  Date of procedure: 02/10/23   CHA2DS2-VASc Score = 3   This indicates a 3.2% annual risk of stroke. The patient's score is based upon: CHF History: 0 (EF normalized) HTN History: 1 Diabetes History: 0 Stroke History: 0 Vascular Disease History: 0 Age Score: 1 Gender Score: 1      CrCl 34 ml/min  Per office protocol, patient can hold Xarelto for 2 days prior to procedure.    **This guidance is not considered finalized until pre-operative APP has relayed final recommendations.**

## 2023-01-24 NOTE — Telephone Encounter (Signed)
  Patient Consent for Virtual Visit         Mackenzie [name] has provided verbal consent on 01/24/2023 for a virtual visit (video or telephone).   CONSENT FOR VIRTUAL VISIT FOR:  Mackenzie [name]  By participating in this virtual visit I agree to the following:  I hereby voluntarily request, consent and authorize [facility] and its employed or contracted physicians, physician assistants, nurse practitioners or other licensed health care professionals (the Practitioner), to provide me with telemedicine health care services (the "Services") as deemed necessary by the treating Practitioner. I acknowledge and consent to receive the Services by the Practitioner via telemedicine. I understand that the telemedicine visit will involve communicating with the Practitioner through live audiovisual communication technology and the disclosure of certain medical information by electronic transmission. I acknowledge that I have been given the opportunity to request an in-person assessment or other available alternative prior to the telemedicine visit and am voluntarily participating in the telemedicine visit.  I understand that I have the right to withhold or withdraw my consent to the use of telemedicine in the course of my care at any time, without affecting my right to future care or treatment, and that the Practitioner or I may terminate the telemedicine visit at any time. I understand that I have the right to inspect all information obtained and/or recorded in the course of the telemedicine visit and may receive copies of available information for a reasonable fee.  I understand that some of the potential risks of receiving the Services via telemedicine include:  Delay or interruption in medical evaluation due to technological equipment failure or disruption; Information transmitted may not be sufficient (e.g. poor resolution of images) to allow for appropriate medical decision making by the  Practitioner; and/or  In rare instances, security protocols could fail, causing a breach of personal health information.  Furthermore, I acknowledge that it is my responsibility to provide information about my medical history, conditions and care that is complete and accurate to the best of my ability. I acknowledge that Practitioner's advice, recommendations, and/or decision may be based on factors not within their control, such as incomplete or inaccurate data provided by me or distortions of diagnostic images or specimens that may result from electronic transmissions. I understand that the practice of medicine is not an exact science and that Practitioner makes no warranties or guarantees regarding treatment outcomes. I acknowledge that a copy of this consent can be made available to me via my patient portal [facility] MyChart), or I can request a printed copy by calling the office of [facility].    I understand that my insurance will be billed for this visit.   I have read or had this consent read to me. I understand the contents of this consent, which adequately explains the benefits and risks of the Services being provided via telemedicine.  I have been provided ample opportunity to ask questions regarding this consent and the Services and have had my questions answered to my satisfaction. I give my informed consent for the services to be provided through the use of telemedicine in my medical care

## 2023-01-24 NOTE — Telephone Encounter (Signed)
Pre-operative Risk Assessment    Patient Name: Mackenzie [name]  DOB: [birth year] MRN: [id number]      Request for Surgical Clearance    Procedure:  Rigid Esophagoscopy with balloon dialation  Date of Surgery:   02-10-23                                 Surgeon:  Sr Christia Reading Surgeon's Group or Practice Name:   Phone number:  [phone] Fax number:  [phone]   Type of Clearance Requested:   - Medical  - Pharmacy:  Hold Rivaroxaban (Xarelto)     Type of Anesthesia:  General    Additional requests/questions:    [name]   01/24/2023, 2:30 PM

## 2023-01-24 NOTE — Telephone Encounter (Signed)
Name: Mackenzie [name]  DOB: 1950-09-05  MRN: [id number]  Primary Cardiologist: Peter [name], MD   Preoperative team, please contact this patient and set up a phone call appointment for further preoperative risk assessment. Please obtain consent and complete medication review. Thank you for your help.  I confirm that guidance regarding antiplatelet and oral anticoagulation therapy has been completed and, if necessary, noted below.  Per office protocol, patient can hold Xarelto for 2 days prior to procedure.  Please resume Xarelto as soon as possible postprocedure, at the discretion of the surgeon.    I also confirmed the patient resides in the state of [place]. As per [facility] telemedicine laws, the patient must reside in the state in which the provider is licensed.   Joylene Grapes, NP 01/24/2023, 4:16 PM  [facility]

## 2023-01-24 NOTE — Telephone Encounter (Signed)
Patient scheduled for tele visit on 02/05/23. Med rec and consent done

## 2023-02-05 ENCOUNTER — Ambulatory Visit: Payer: BC Managed Care – PPO | Attending: Cardiovascular Disease

## 2023-02-05 DIAGNOSIS — Z01810 Encounter for preprocedural cardiovascular examination: Secondary | ICD-10-CM | POA: Diagnosis not present

## 2023-02-05 NOTE — Progress Notes (Signed)
Virtual Visit via Telephone Note   Because of [name]'s co-morbid illnesses, she is at least at moderate risk for complications without adequate follow up.  This format is felt to be most appropriate for this patient at this time.  The patient did not have access to video technology/had technical difficulties with video requiring transitioning to audio format only (telephone).  All issues noted in this document were discussed and addressed.  No physical exam could be performed with this format.  Please refer to the patient's chart for her consent to telehealth for Mackenzie [name].  Evaluation Performed:  Preoperative cardiovascular risk assessment _____________   Date:  02/05/2023   Patient ID:  Mackenzie [name], DOB 1950/08/11, MRN [id number] Patient Location:  Home Provider location:   Office  Primary Care Provider:  Nelwyn Salisbury, MD Primary Cardiologist:  Peter [name], MD  Chief Complaint / Patient Profile   72 y.o. y/o female with a h/o paroxysmal atrial fibrillation, tachycardia induced cardiomyopathy, labile hypertension who is pending rigid esophagoscopy with balloon dilation and presents today for telephonic preoperative cardiovascular risk assessment.  History of Present Illness    Mackenzie [name] is a 72 y.o. female who presents via audio/video conferencing for a telehealth visit today.  Pt was last seen in cardiology clinic on 11/12/2022 by Dr. [name].  At that time Mackenzie [name] was doing well .  The patient is now pending procedure as outlined above. Since her last visit, she continues to be stable from a cardiac standpoint.  Today she denies chest pain, shortness of breath, lower extremity edema, fatigue, palpitations, melena, hematuria, hemoptysis, diaphoresis, weakness, presyncope, syncope, orthopnea, and PND.   Past Medical History    Past Medical History:  Diagnosis Date   Chronic systolic CHF (congestive heart failure) (HCC)    a. 2D ECHO on  02/10/15 w/ severe LV dysfunction with EF 25-30%. Mild MR and mild LAE. negative nuclear stress test, felt to be due to tachycardia    Dyspnea    with exertion   Dysrhythmia    PAF   Esophageal stricture    a. requiring dilations. crushes pills to take PO   Full dentures    GERD (gastroesophageal reflux disease)    Osteoporosis    sees Dr. Ardyth Harps    PAF (paroxysmal atrial fibrillation) [facility])    a. s/p succesfful DCCV on 02/13/15   Pneumonia    (08/15/16)- many years ago   Sebaceous cyst    on back of neck, has seen Dr. Bertram Savin   Past Surgical History:  Procedure Laterality Date   ATRIAL FIBRILLATION ABLATION N/A 01/16/2021   Procedure: ATRIAL FIBRILLATION ABLATION;  Surgeon: Hillis Range, MD;  [facility];  Service: Cardiovascular;  Laterality: N/A;   ATRIAL FIBRILLATION ABLATION N/A 06/03/2022   Procedure: ATRIAL FIBRILLATION ABLATION;  Surgeon: Lanier Prude, MD;  [facility];  Service: Cardiovascular;  Laterality: N/A;   BALLOON DILATION N/A 06/22/2018   Procedure: ESOPHAGEAL BALLOON DILATION;  Surgeon: Christia Reading, MD;  Location: [facility];  Service: ENT;  Laterality: N/A;   BREAST SURGERY Bilateral    Breast Implants   CARDIOVERSION N/A 02/13/2015   Procedure: CARDIOVERSION;  Surgeon: Chrystie Nose, MD;  Location: [facility] ENDOSCOPY;  Service: Cardiovascular;  Laterality: N/A;   CARDIOVERSION N/A 09/16/2022   Procedure: CARDIOVERSION;  Surgeon: Christell Constant, MD;  [facility];  Service: Cardiovascular;  Laterality: N/A;  COLONOSCOPY  09/24/2002   ESOPHAGEAL DILATION N/A 08/19/2016   Procedure: ESOPHAGEAL DILATION;  Surgeon: Christia Reading, MD;  Location: [facility] OR;  Service: ENT;  Laterality: N/A;  esophagoscopy with balloon dilation   ESOPHAGOGASTRODUODENOSCOPY  06/01/2003   12/13   ESOPHAGOGASTRODUODENOSCOPY (EGD) WITH ESOPHAGEAL DILATION     ESOPHAGOSCOPY WITH DILITATION N/A 06/16/2017   Procedure:  ESOPHAGOSCOPY WITH BALLOON DILITATION;  Surgeon: Christia Reading, MD;  Location: [facility];  Service: ENT;  Laterality: N/A;   RIGID ESOPHAGOSCOPY N/A 01/18/2013   Procedure: RIGID ESOPHAGOSCOPY WITH ESPHAGEAL Balloon DILATION ;  Surgeon: Christia Reading, MD;  Location: [facility];  Service: ENT;  Laterality: N/A;   RIGID ESOPHAGOSCOPY N/A 06/22/2018   Procedure: RIGID ESOPHAGOSCOPY;  Surgeon: Christia Reading, MD;  Location: [facility];  Service: ENT;  Laterality: N/A;   TEE WITHOUT CARDIOVERSION N/A 02/13/2015   Procedure: TRANSESOPHAGEAL ECHOCARDIOGRAM (TEE);  Surgeon: Chrystie Nose, MD;  Location: [facility] ENDOSCOPY;  Service: Cardiovascular;  Laterality: N/A;    Allergies  Allergies  Allergen Reactions   Ramipril Cough    Home Medications    Prior to Admission medications   Medication Sig Start Date End Date Taking? Authorizing Provider  amiodarone (PACERONE) 200 MG tablet Take 1 tablet (200 mg total) by mouth daily. 11/27/22   [name], Peter M, MD  chlorthalidone (HYGROTON) 25 MG tablet TAKE 1/2 TABLET BY MOUTH DAILY 09/12/22   Lanier Prude, MD  lansoprazole (PREVACID) 30 MG capsule Take 1 capsule (30 mg total) by mouth daily. 06/20/22   Nelwyn Salisbury, MD  losartan (COZAAR) 100 MG tablet TAKE 1 TABLET BY MOUTH EVERY DAY Patient taking differently: Take 50 mg by mouth daily. 01/06/23   [name], Peter M, MD  metoprolol tartrate (LOPRESSOR) 25 MG tablet Take 1 tablet (25 mg total) by mouth 2 (two) times daily. 08/21/22   [name], Peter M, MD  XARELTO 15 MG TABS tablet TAKE 1 TABLET (15 MG TOTAL) BY MOUTH DAILY WITH SUPPER 08/12/22   [name], Peter M, MD    Physical Exam    Vital Signs:  Mackenzie [name] does not have vital signs available for review today.  Given telephonic nature of communication, physical exam is limited. AAOx3. NAD. Normal affect.  Speech and respirations are unlabored.  Accessory Clinical Findings    None  Assessment & Plan     1.  Preoperative Cardiovascular Risk Assessment:  Rigid Esophagoscopy with balloon dialation , 02/10/2023, Dr.  Jenne Pane, fax #[phone]      Primary Cardiologist: Peter [name], MD  Chart reviewed as part of pre-operative protocol coverage. Given past medical history and time since last visit, based on ACC/AHA guidelines, Mackenzie [name] would be at acceptable risk for the planned procedure without further cardiovascular testing.   Per office protocol, patient can hold Xarelto for 2 days prior to procedure.  Please resume Xarelto as soon as possible postprocedure, at the discretion of the surgeon.   Patient was advised that if she develops new symptoms prior to surgery to contact our office to arrange a follow-up appointment.  She verbalized understanding.  I will route this recommendation to the requesting party via Epic fax function and remove from pre-op pool.      Time:   Today, I have spent 5 minutes with the patient with telehealth technology discussing medical history, symptoms, and management plan.  Prior to patient's phone evaluation I spent greater than 10 minutes reviewing their past medical history and cardiac medications.  Ronney Asters, NP  02/05/2023, 7:02 AM

## 2023-02-06 ENCOUNTER — Other Ambulatory Visit: Payer: Self-pay

## 2023-02-06 ENCOUNTER — Other Ambulatory Visit: Payer: Self-pay | Admitting: Otolaryngology

## 2023-02-06 ENCOUNTER — Encounter (HOSPITAL_COMMUNITY): Payer: Self-pay | Admitting: Otolaryngology

## 2023-02-06 NOTE — Progress Notes (Signed)
PCP - Dr Larey Dresser Cardiologist - Dr Peter [name] EP - Dr Steffanie Dunn  Chest x-ray - n/a EKG - 11/12/22 Stress Test - 02/11/15 ECHO - 07/31/22 Cardiac Cath - n/a  ICD Pacemaker/Loop - n/a  Sleep Study -  Yes (07/2021) CPAP - does not use CPAP  Diabetes - n/a  Blood Thinner Instructions:  Hold Xarelto 2 days prior to procedure per MD note.   ERAS: Clear liquids til 9:15 AM DOS.  Anesthesia review: Yes  STOP now taking any Aspirin (unless otherwise instructed by your surgeon), Aleve, Naproxen, Ibuprofen, Motrin, Advil, Goody's, BC's, all herbal medications, fish oil, and all vitamins.   Coronavirus Screening Do you have any of the following symptoms:  Cough yes/no: No Fever (>100.55F)  yes/no: No Runny nose yes/no: No Sore throat yes/no: No Difficulty breathing/shortness of breath  yes/no: No  Have you traveled in the last 14 days and where? yes/no: No  Patient verbalized understanding of instructions that were given to them at the PAT appointment. Patient was also instructed that they will need to review over the PAT instructions again at home before surgery.

## 2023-02-07 ENCOUNTER — Other Ambulatory Visit: Payer: Self-pay | Admitting: Cardiology

## 2023-02-07 DIAGNOSIS — I4819 Other persistent atrial fibrillation: Secondary | ICD-10-CM

## 2023-02-07 NOTE — Progress Notes (Signed)
Anesthesia Chart Review: SAME DAY WORK-UP  Case: [id number] Date/Time: 02/10/23 1200   Procedure: RIGID ESOPHAGOSCOPY WITH BALLOON DILATION   Anesthesia type: General   Pre-op diagnosis: Esophageal stricture; Esophageal dysphagia   [facility] ROOM 08 / [facility]   Surgeons: Christia Reading, MD       DISCUSSION: Patient is a 72 year old female scheduled for the above procedure.  History includes never smoker, HTN, PAF (diagnosed 2016, s/p afib ablation 01/16/21, 06/03/22, DCCV 02/13/15, 09/16/22), cardiomyopathy  (likely tachy-mediated, non-ischemic NST, EF 21% 2016; EF 50% 07/31/22), chronic combined systolic and diastolic CHF, exertional dyspnea, OSA (intolerant of CPAP), GERD, esophageal stenosis  (s/p balloon dilation 2014, 2018, 2019, 06/22/18), dementia.  Preoperative cardiology risk assessment outlined on 02/05/2023 by Edd Fabian, NP: "Given past medical history and time since last visit, based on ACC/AHA guidelines, Mackenzie [name] would be at acceptable risk for the planned procedure without further cardiovascular testing.    Per office protocol, patient can hold Xarelto for 2 days prior to procedure.  Please resume Xarelto as soon as possible postprocedure, at the discretion of the surgeon...."  Anesthesia team to evaluate on the day of surgery.   VS: Ht 5' (1.524 m)   Wt 44.5 kg   BMI 19.14 kg/m  BP Readings from Last 3 Encounters:  11/12/22 128/74  11/12/22 (!) 150/82  10/29/22 (!) 150/90   Pulse Readings from Last 3 Encounters:  11/12/22 60  11/12/22 64  10/29/22 81    PROVIDERS: Nelwyn Salisbury, MD is PCP  [name], Peter, MD is cardiologist, last office visit 11/12/22. She was in SR with first degree AV block. Preoperative APP evaluation 02/05/23.  Steffanie Dunn, MD is EP cardiologist, last visit 10/29/22 with Canary Brim, NP. She was back in rate controlled afib. Amiodarone previously discontinued due to linear scarring at lung bases on imaging, and CKD precluded use  of Tikosyn; However, amiodarone resumed since back and afib and had planned for repeat DCCV but she converted spontaneously by 11/11/22 and continued on low dose amiodarone daily. Also on metoprolol and Xarelto. CHA2DS2-VASc Score = 3.   LABS: Most recent lab results in [facility] include: Lab Results  Component Value Date   WBC 6.5 09/11/2022   HGB 12.0 09/11/2022   HCT 37.2 09/11/2022   PLT 276 09/11/2022   GLUCOSE 100 (H) 09/11/2022   ALT 12 06/26/2022   AST 22 06/26/2022   NA 139 09/11/2022   K 4.8 09/11/2022   CL 103 09/11/2022   CREATININE 1.10 (H) 09/11/2022   BUN 20 09/11/2022   CO2 24 09/11/2022   TSH 2.53 06/26/2022   HGBA1C 5.8 06/26/2022    IMAGING: CT Chest (over read CT Coronary) 05/28/22: FINDINGS: - Heart is normal size. Aorta normal caliber. No adenopathy. Scattered calcifications in the descending thoracic aorta. No confluent airspace opacities or effusions. Linear scarring in the lung bases. No acute findings in the upper abdomen Chest wall soft tissues are unremarkable. Bilateral breast implants partially visualized, grossly unremarkable. No acute bony abnormality. IMPRESSION: No no acute extra cardiac abnormality. Scattered descending aortic atherosclerosis.   EKG: 11/12/22 ([facility]): Sinus rhythm with 1st degree A-V block Left axis deviation Left ventricular hypertrophy with repolarization abnormality ( R in aVL , Cornell product , Romhilt-Estes ) When compared with ECG of 29-Oct-2022 10:25, Sinus rhythm has replaced Atrial fibrillation QRS duration has increased Non-specific change in ST segment in Anterior leads Confirmed by [name], Peter [phone]) on 11/12/2022 10:34:51 AM   CV: Echo 07/31/22:  IMPRESSIONS   1. Left ventricular ejection fraction, by estimation, is 50%. The left  ventricle has mildly decreased function. The left ventricle demonstrates  global hypokinesis. There is mild concentric left ventricular hypertrophy.  Left ventricular  diastolic  parameters are indeterminate.   2. Right ventricular systolic function is mildly reduced. The right  ventricular size is normal. There is mildly elevated pulmonary artery  systolic pressure. The estimated right ventricular systolic pressure is  38.5 mmHg.   3. Left atrial size was mildly dilated.   4. The mitral valve is normal in structure. Trivial mitral valve  regurgitation. No evidence of mitral stenosis.   5. The aortic valve is tricuspid. Aortic valve regurgitation is not  visualized. No aortic stenosis is present.   6. The inferior vena cava is dilated in size with >50% respiratory  variability, suggesting right atrial pressure of 8 mmHg.   7. The patient was in atrial fibrillation.   CT Cardiac 05/28/22 (pre afib ablation): IMPRESSION: 1. There is normal pulmonary vein drainage into the left atrium with ostial measurements above. 2. There is no thrombus in the left atrial appendage. 3. The esophagus runs in the left atrial midline and is not in proximity to any of the pulmonary vein ostia. 4. No PFO/ASD. 5. Normal coronary origin. Right dominance. 6. CAC score of 43.9 Which is 56 percentile for age-, race-, and sex-matched controls.      Past Medical History:  Diagnosis Date   Chronic systolic CHF (congestive heart failure) (HCC)    a. 2D ECHO on 02/10/15 w/ severe LV dysfunction with EF 25-30%. Mild MR and mild LAE. negative nuclear stress test, felt to be due to tachycardia    Dementia (HCC)    Dyspnea    with exertion   Dysrhythmia    PAF   Esophageal stricture    a. requiring dilations. crushes pills to take PO   Full dentures    GERD (gastroesophageal reflux disease)    Hypertension    Osteoporosis    sees Dr. Ardyth Harps    PAF (paroxysmal atrial fibrillation) [facility])    a. s/p succesfful DCCV on 02/13/15   Pneumonia    (08/15/16)- many years ago   Sebaceous cyst    on back of neck, has seen Dr. Bertram Savin   Sleep apnea    does not use CPAP     Past Surgical History:  Procedure Laterality Date   ATRIAL FIBRILLATION ABLATION N/A 01/16/2021   Procedure: ATRIAL FIBRILLATION ABLATION;  Surgeon: Hillis Range, MD;  [facility];  Service: Cardiovascular;  Laterality: N/A;   ATRIAL FIBRILLATION ABLATION N/A 06/03/2022   Procedure: ATRIAL FIBRILLATION ABLATION;  Surgeon: Lanier Prude, MD;  [facility];  Service: Cardiovascular;  Laterality: N/A;   BALLOON DILATION N/A 06/22/2018   Procedure: ESOPHAGEAL BALLOON DILATION;  Surgeon: Christia Reading, MD;  Location: [facility];  Service: ENT;  Laterality: N/A;   BREAST SURGERY Bilateral    Breast Implants   CARDIOVERSION N/A 02/13/2015   Procedure: CARDIOVERSION;  Surgeon: Chrystie Nose, MD;  Location: [facility] ENDOSCOPY;  Service: Cardiovascular;  Laterality: N/A;   CARDIOVERSION N/A 09/16/2022   Procedure: CARDIOVERSION;  Surgeon: Christell Constant, MD;  [facility];  Service: Cardiovascular;  Laterality: N/A;   COLONOSCOPY  09/24/2002   ESOPHAGEAL DILATION N/A 08/19/2016   Procedure: ESOPHAGEAL DILATION;  Surgeon: Christia Reading, MD;  Location: [facility] OR;  Service: ENT;  Laterality: N/A;  esophagoscopy with balloon dilation   ESOPHAGOGASTRODUODENOSCOPY  06/01/2003   12/13   ESOPHAGOGASTRODUODENOSCOPY (EGD) WITH ESOPHAGEAL DILATION     ESOPHAGOSCOPY WITH DILITATION N/A 06/16/2017   Procedure: ESOPHAGOSCOPY WITH BALLOON DILITATION;  Surgeon: Christia Reading, MD;  Location: [facility];  Service: ENT;  Laterality: N/A;   RIGID ESOPHAGOSCOPY N/A 01/18/2013   Procedure: RIGID ESOPHAGOSCOPY WITH ESPHAGEAL Balloon DILATION ;  Surgeon: Christia Reading, MD;  Location: [facility];  Service: ENT;  Laterality: N/A;   RIGID ESOPHAGOSCOPY N/A 06/22/2018   Procedure: RIGID ESOPHAGOSCOPY;  Surgeon: Christia Reading, MD;  Location: [facility];  Service: ENT;  Laterality: N/A;   TEE WITHOUT CARDIOVERSION N/A  02/13/2015   Procedure: TRANSESOPHAGEAL ECHOCARDIOGRAM (TEE);  Surgeon: Chrystie Nose, MD;  Location: [facility] ENDOSCOPY;  Service: Cardiovascular;  Laterality: N/A;    MEDICATIONS: No current facility-administered medications for this encounter.    amiodarone (PACERONE) 200 MG tablet   chlorthalidone (HYGROTON) 25 MG tablet   lansoprazole (PREVACID) 30 MG capsule   losartan (COZAAR) 100 MG tablet   metoprolol tartrate (LOPRESSOR) 25 MG tablet   XARELTO 15 MG TABS tablet    Shonna Chock, PA-C Surgical Short Stay/Anesthesiology [facility] Phone [phone] [facility] Phone [phone] 02/07/2023 10:58 AM

## 2023-02-07 NOTE — Telephone Encounter (Signed)
Prescription refill request for Xarelto received.  Indication:afib Last office visit:10/24 Weight:44.5  kg Age:72 Scr:1.10  5/24 CrCl:32  ml/min  Prescription refilled

## 2023-02-07 NOTE — Anesthesia Preprocedure Evaluation (Signed)
Anesthesia Evaluation  Patient identified by MRN, date of birth, ID band Patient awake    Reviewed: Allergy & Precautions, NPO status , Patient's Chart, lab work & pertinent test results, reviewed documented beta blocker date and time   History of Anesthesia Complications Negative for: history of anesthetic complications  Airway Mallampati: II  TM Distance: >3 FB Neck ROM: Full    Dental  (+) Lower Dentures, Upper Dentures   Pulmonary sleep apnea    Pulmonary exam normal        Cardiovascular hypertension, Pt. on medications and Pt. on home beta blockers +CHF  Normal cardiovascular exam+ dysrhythmias Atrial Fibrillation   TTE 07/31/22: EF 50%, global hypokinesis, mild LVH, mild RV dysfunction, mild pHTN (RVSP 38.39mmHg), mild LAE, valves ok    Neuro/Psych  PSYCHIATRIC DISORDERS     Dementia    GI/Hepatic Neg liver ROS,GERD  ,,  Endo/Other  negative endocrine ROS    Renal/GU negative Renal ROS  negative genitourinary   Musculoskeletal negative musculoskeletal ROS (+)    Abdominal   Peds  Hematology negative hematology ROS (+)   Anesthesia Other Findings   Reproductive/Obstetrics negative OB ROS                              Anesthesia Physical Anesthesia Plan  ASA: 3  Anesthesia Plan: General   Post-op Pain Management: Minimal or no pain anticipated   Induction: Intravenous  PONV Risk Score and Plan: 3 and Ondansetron and Dexamethasone  Airway Management Planned: Oral ETT  Additional Equipment: None  Intra-op Plan:   Post-operative Plan:   Informed Consent: I have reviewed the patients History and Physical, chart, labs and discussed the procedure including the risks, benefits and alternatives for the proposed anesthesia with the patient or authorized representative who has indicated his/her understanding and acceptance.       Plan Discussed with: CRNA  Anesthesia Plan  Comments: (PAT note written 02/07/2023 by Shonna Chock, PA-C.  Patient is a 72 year old female scheduled for the above procedure.   History includes never smoker, HTN, PAF (diagnosed 2016, s/p afib ablation 01/16/21, 06/03/22, DCCV 02/13/15, 09/16/22), cardiomyopathy  (likely tachy-mediated, non-ischemic NST, EF 21% 2016; EF 50% 07/31/22), chronic combined systolic and diastolic CHF, exertional dyspnea, OSA (intolerant of CPAP), GERD, esophageal stenosis  (s/p balloon dilation 2014, 2018, 2019, 06/22/18), dementia.   Preoperative cardiology risk assessment outlined on 02/05/2023 by Edd Fabian, NP: "Given past medical history and time since last visit, based on ACC/AHA guidelines, KATELIN AFRIDI would be at acceptable risk for the planned procedure without further cardiovascular testing.    Per office protocol, patient can hold Xarelto for 2 days prior to procedure.  Please resume Xarelto as soon as possible postprocedure, at the discretion of the surgeon...."   )        Anesthesia Quick Evaluation

## 2023-02-10 ENCOUNTER — Encounter (HOSPITAL_COMMUNITY)
Admission: RE | Disposition: A | Discharge status: Home / Self Care | Payer: Self-pay | Source: Home / Self Care | Attending: Otolaryngology

## 2023-02-10 ENCOUNTER — Other Ambulatory Visit: Payer: Self-pay

## 2023-02-10 ENCOUNTER — Ambulatory Visit (HOSPITAL_COMMUNITY): Payer: Self-pay | Admitting: Vascular Surgery

## 2023-02-10 ENCOUNTER — Ambulatory Visit (HOSPITAL_COMMUNITY): Payer: BC Managed Care – PPO | Admitting: Vascular Surgery

## 2023-02-10 ENCOUNTER — Ambulatory Visit (HOSPITAL_COMMUNITY)
Admission: RE | Admit: 2023-02-10 | Discharge: 2023-02-10 | Disposition: A | Discharge status: Home / Self Care | Payer: BC Managed Care – PPO | Attending: Otolaryngology | Admitting: Otolaryngology

## 2023-02-10 ENCOUNTER — Encounter (HOSPITAL_COMMUNITY): Payer: Self-pay | Admitting: Otolaryngology

## 2023-02-10 DIAGNOSIS — Z7901 Long term (current) use of anticoagulants: Secondary | ICD-10-CM | POA: Insufficient documentation

## 2023-02-10 DIAGNOSIS — K222 Esophageal obstruction: Secondary | ICD-10-CM | POA: Diagnosis not present

## 2023-02-10 DIAGNOSIS — I5042 Chronic combined systolic (congestive) and diastolic (congestive) heart failure: Secondary | ICD-10-CM | POA: Insufficient documentation

## 2023-02-10 DIAGNOSIS — Z79899 Other long term (current) drug therapy: Secondary | ICD-10-CM | POA: Insufficient documentation

## 2023-02-10 DIAGNOSIS — G4730 Sleep apnea, unspecified: Secondary | ICD-10-CM | POA: Diagnosis not present

## 2023-02-10 DIAGNOSIS — I480 Paroxysmal atrial fibrillation: Secondary | ICD-10-CM | POA: Diagnosis not present

## 2023-02-10 DIAGNOSIS — I272 Pulmonary hypertension, unspecified: Secondary | ICD-10-CM | POA: Insufficient documentation

## 2023-02-10 DIAGNOSIS — I700 Atherosclerosis of aorta: Secondary | ICD-10-CM | POA: Insufficient documentation

## 2023-02-10 DIAGNOSIS — F0390 Unspecified dementia without behavioral disturbance: Secondary | ICD-10-CM | POA: Insufficient documentation

## 2023-02-10 DIAGNOSIS — I110 Hypertensive heart disease with heart failure: Secondary | ICD-10-CM | POA: Diagnosis not present

## 2023-02-10 DIAGNOSIS — K219 Gastro-esophageal reflux disease without esophagitis: Secondary | ICD-10-CM | POA: Insufficient documentation

## 2023-02-10 DIAGNOSIS — I5021 Acute systolic (congestive) heart failure: Secondary | ICD-10-CM | POA: Diagnosis not present

## 2023-02-10 HISTORY — DX: Essential (primary) hypertension: I10

## 2023-02-10 HISTORY — PX: ESOPHAGOSCOPY: SHX5534

## 2023-02-10 HISTORY — DX: Sleep apnea, unspecified: G47.30

## 2023-02-10 HISTORY — PX: BALLOON DILATION: SHX5330

## 2023-02-10 HISTORY — DX: Unspecified dementia, unspecified severity, without behavioral disturbance, psychotic disturbance, mood disturbance, and anxiety: F03.90

## 2023-02-10 LAB — CBC
HCT: 40.9 % (ref 36.0–46.0)
Hemoglobin: 12.7 g/dL (ref 12.0–15.0)
MCH: 29.3 pg (ref 26.0–34.0)
MCHC: 31.1 g/dL (ref 30.0–36.0)
MCV: 94.5 fL (ref 80.0–100.0)
Platelets: 219 10*3/uL (ref 150–400)
RBC: 4.33 MIL/uL (ref 3.87–5.11)
RDW: 13.3 % (ref 11.5–15.5)
WBC: 6.3 10*3/uL (ref 4.0–10.5)
nRBC: 0 % (ref 0.0–0.2)

## 2023-02-10 LAB — BASIC METABOLIC PANEL
Anion gap: 8 (ref 5–15)
BUN: 15 mg/dL (ref 8–23)
CO2: 25 mmol/L (ref 22–32)
Calcium: 8.9 mg/dL (ref 8.9–10.3)
Chloride: 104 mmol/L (ref 98–111)
Creatinine, Ser: 1.15 mg/dL — ABNORMAL HIGH (ref 0.44–1.00)
GFR, Estimated: 50 mL/min — ABNORMAL LOW (ref >60)
Glucose, Bld: 106 mg/dL — ABNORMAL HIGH (ref 70–99)
Potassium: 3.8 mmol/L (ref 3.5–5.1)
Sodium: 137 mmol/L (ref 135–145)

## 2023-02-10 SURGERY — ESOPHAGOSCOPY
Anesthesia: General | Site: Esophagus

## 2023-02-10 MED ORDER — DEXAMETHASONE SODIUM PHOSPHATE 10 MG/ML IJ SOLN
INTRAMUSCULAR | Status: DC | PRN
  Administered 2023-02-10: 4 mg via INTRAVENOUS

## 2023-02-10 MED ORDER — LIDOCAINE HCL (CARDIAC) PF 100 MG/5ML IV SOSY
PREFILLED_SYRINGE | INTRAVENOUS | Status: DC | PRN
  Administered 2023-02-10: 40 mg via INTRAVENOUS

## 2023-02-10 MED ORDER — CHLORHEXIDINE GLUCONATE 0.12 % MT SOLN
15.0000 mL | Freq: Once | OROMUCOSAL | Status: AC
  Administered 2023-02-10: 15 mL via OROMUCOSAL
  Filled 2023-02-10: qty 15

## 2023-02-10 MED ORDER — DROPERIDOL 2.5 MG/ML IJ SOLN
0.6250 mg | Freq: Once | INTRAMUSCULAR | Status: DC | PRN
Start: 2023-02-10 — End: 2023-02-10

## 2023-02-10 MED ORDER — PROPOFOL 10 MG/ML IV BOLUS
INTRAVENOUS | Status: DC | PRN
  Administered 2023-02-10: 100 mg via INTRAVENOUS

## 2023-02-10 MED ORDER — FENTANYL CITRATE (PF) 250 MCG/5ML IJ SOLN
INTRAMUSCULAR | Status: AC
  Filled 2023-02-10: qty 5

## 2023-02-10 MED ORDER — FENTANYL CITRATE (PF) 250 MCG/5ML IJ SOLN
INTRAMUSCULAR | Status: DC | PRN
  Administered 2023-02-10: 50 ug via INTRAVENOUS

## 2023-02-10 MED ORDER — PHENYLEPHRINE 80 MCG/ML (10ML) SYRINGE FOR IV PUSH (FOR BLOOD PRESSURE SUPPORT)
PREFILLED_SYRINGE | INTRAVENOUS | Status: DC | PRN
  Administered 2023-02-10: 80 ug via INTRAVENOUS

## 2023-02-10 MED ORDER — ONDANSETRON HCL 4 MG/2ML IJ SOLN
INTRAMUSCULAR | Status: DC | PRN
  Administered 2023-02-10: 4 mg via INTRAVENOUS

## 2023-02-10 MED ORDER — 0.9 % SODIUM CHLORIDE (POUR BTL) OPTIME
TOPICAL | Status: DC | PRN
  Administered 2023-02-10: 1000 mL

## 2023-02-10 MED ORDER — FENTANYL CITRATE (PF) 100 MCG/2ML IJ SOLN
25.0000 ug | INTRAMUSCULAR | Status: DC | PRN
Start: 2023-02-10 — End: 2023-02-10

## 2023-02-10 MED ORDER — LACTATED RINGERS IV SOLN: INTRAVENOUS | Status: DC

## 2023-02-10 MED ORDER — PROPOFOL 10 MG/ML IV BOLUS
INTRAVENOUS | Status: AC
  Filled 2023-02-10: qty 20

## 2023-02-10 MED ORDER — ORAL CARE MOUTH RINSE: 15.0000 mL | Freq: Once | OROMUCOSAL | Status: AC

## 2023-02-10 MED ORDER — SUGAMMADEX SODIUM 200 MG/2ML IV SOLN
INTRAVENOUS | Status: DC | PRN
  Administered 2023-02-10: 200 mg via INTRAVENOUS

## 2023-02-10 MED ORDER — ROCURONIUM BROMIDE 10 MG/ML (PF) SYRINGE
PREFILLED_SYRINGE | INTRAVENOUS | Status: DC | PRN
  Administered 2023-02-10: 30 mg via INTRAVENOUS

## 2023-02-10 SURGICAL SUPPLY — 27 items
BAG COUNTER SPONGE SURGICOUNT (BAG) ×2 IMPLANT
BAG SPNG CNTER NS LX DISP (BAG) ×1
BALLN PULM 15 16.5 18X75 (BALLOONS) ×1
BALLOON PULM 15 16.5 18X75 (BALLOONS) IMPLANT
BLADE SURG 15 STRL LF DISP TIS (BLADE) IMPLANT
BLADE SURG 15 STRL SS (BLADE)
CANISTER SUCT 3000ML PPV (MISCELLANEOUS) ×2 IMPLANT
COVER BACK TABLE 60X90IN (DRAPES) ×2 IMPLANT
COVER MAYO STAND STRL (DRAPES) ×2 IMPLANT
DRAPE HALF SHEET 40X57 (DRAPES) ×2 IMPLANT
GAUZE 4X4 16PLY ~~LOC~~+RFID DBL (SPONGE) IMPLANT
GAUZE SPONGE 4X4 12PLY STRL (GAUZE/BANDAGES/DRESSINGS) ×2 IMPLANT
GLOVE BIO SURGEON STRL SZ7.5 (GLOVE) ×2 IMPLANT
GOWN STRL REUS W/ TWL LRG LVL3 (GOWN DISPOSABLE) ×4 IMPLANT
GOWN STRL REUS W/TWL LRG LVL3 (GOWN DISPOSABLE) ×2
KIT BASIN OR (CUSTOM PROCEDURE TRAY) ×2 IMPLANT
KIT TURNOVER KIT B (KITS) ×2 IMPLANT
NDL HYPO 25GX1X1/2 BEV (NEEDLE) IMPLANT
NEEDLE HYPO 25GX1X1/2 BEV (NEEDLE)
NS IRRIG 1000ML POUR BTL (IV SOLUTION) ×2 IMPLANT
PAD ARMBOARD 7.5X6 YLW CONV (MISCELLANEOUS) ×4 IMPLANT
PATTIES SURGICAL .5 X3 (DISPOSABLE) IMPLANT
POSITIONER HEAD DONUT 9IN (MISCELLANEOUS) IMPLANT
SOL ANTI FOG 6CC (MISCELLANEOUS) ×2 IMPLANT
SURGILUBE 2OZ TUBE FLIPTOP (MISCELLANEOUS) ×2 IMPLANT
TOWEL GREEN STERILE FF (TOWEL DISPOSABLE) ×2 IMPLANT
TUBE CONNECTING 12X1/4 (SUCTIONS) ×2 IMPLANT

## 2023-02-10 NOTE — Transfer of Care (Signed)
Immediate Anesthesia Transfer of Care Note  Patient: Mackenzie [name]  Procedure(s) Performed: RIGID ESOPHAGOSCOPY (Esophagus) BALLOON DILATION (Esophagus)  Patient Location: PACU  Anesthesia Type:General  Level of Consciousness: awake, alert , oriented, and patient cooperative  Airway & Oxygen Therapy: Patient Spontanous Breathing and Patient connected to nasal cannula oxygen  Post-op Assessment: Report given to RN and Post -op Vital signs reviewed and stable  Post vital signs: Reviewed and stable  Last Vitals:  Vitals Value Taken Time  BP 174/74 02/10/23 1255  Temp    Pulse 49 02/10/23 1257  Resp 11 02/10/23 1257  SpO2 100 % 02/10/23 1257  Vitals shown include unfiled device data.  Last Pain:  Vitals:   02/10/23 1006  TempSrc:   PainSc: 0-No pain      Patients Stated Pain Goal: 0 (02/10/23 1006)  Complications: No notable events documented.

## 2023-02-10 NOTE — Progress Notes (Signed)
Dr. Charlynn Grimes made aware of patient's elevated BP readings this morning. 184/90 upon arrival to Short Stay, 191/72 when rechecked at 1016 and 214/88 at 1026. Patient took Metoprolol 25 mg at 7 am. Ok per Dr. Charlynn Grimes. No new orders received.

## 2023-02-10 NOTE — Plan of Care (Signed)
 [facility] Tonsillectomy/Adenoidectomy, Postoperative PEDS care plan entered in error.

## 2023-02-10 NOTE — Anesthesia Postprocedure Evaluation (Signed)
Anesthesia Post Note  Patient: Mackenzie [name]  Procedure(s) Performed: RIGID ESOPHAGOSCOPY (Esophagus) BALLOON DILATION (Esophagus)     Patient location during evaluation: PACU Anesthesia Type: General Level of consciousness: awake and alert Pain management: pain level controlled Vital Signs Assessment: post-procedure vital signs reviewed and stable Respiratory status: spontaneous breathing, nonlabored ventilation, respiratory function stable and patient connected to nasal cannula oxygen Cardiovascular status: blood pressure returned to baseline and stable Postop Assessment: no apparent nausea or vomiting Anesthetic complications: no   No notable events documented.  Last Vitals:  Vitals:   02/10/23 1315 02/10/23 1325  BP: (!) 174/79 (!) 167/62  Pulse: (!) 50 (!) 50  Resp: 14 15  Temp:  36.4 C  SpO2: 98% 97%    Last Pain:  Vitals:   02/10/23 1325  TempSrc:   PainSc: 0-No pain                 [place]

## 2023-02-10 NOTE — Anesthesia Procedure Notes (Signed)
Procedure Name: Intubation Date/Time: 02/10/2023 12:29 PM  Performed by: Yolonda Kida, CRNAPre-anesthesia Checklist: Patient identified, Emergency Drugs available, Suction available and Patient being monitored Patient Re-evaluated:Patient Re-evaluated prior to induction Oxygen Delivery Method: Circle System Utilized Preoxygenation: Pre-oxygenation with 100% oxygen Induction Type: IV induction Ventilation: Mask ventilation without difficulty Laryngoscope Size: Miller and 2 Grade View: Grade I Tube type: Oral Tube size: 7.0 mm Number of attempts: 1 Airway Equipment and Method: Stylet Placement Confirmation: ETT inserted through vocal cords under direct vision, positive ETCO2 and breath sounds checked- equal and bilateral Secured at: 19 cm Tube secured with: Tape Dental Injury: Teeth and Oropharynx as per pre-operative assessment

## 2023-02-10 NOTE — Op Note (Signed)
Preop diagnosis: Proximal esophageal stricture Postop diagnosis: same Procedure: Rigid esophagoscopy with dilation Surgeon: Jenne Pane Anesth: General Compl: None Findings: 90% stricture at 15 cm from teeth.  Dilated to 18 mm balloon. Description:  After discussing risks, benefits, and alternatives, the patient was brought to the operating room and placed on the operative table in the supine position.  Anesthesia was induced and the patient was intubated by the anesthesia team without difficulty.  The eyes were taped closed and the bed was turned 90 degrees from anesthesia.  A damp gauze was placed over the upper gum and a Storz rigid esophagoscope was inserted and passed down the esophagus keeping the lumen in view.  The stricture was encountered at 15 cm from the teeth and was then dilated using a balloon dilator, first to 3 atm, then 4.5 atm, and finally to 7 atm.  This resulted in an excellent dilation with only mucosal tear.  The esophagoscope was then removed.  The patient was turned back to anesthesia for wake up and was extubated and moved to the recovery room in stable condition.

## 2023-02-10 NOTE — H&P (Signed)
Mackenzie [name] is an 72 y.o. female.   Chief Complaint: Esophageal stricture HPI: 72 year old female with history of proximal esophageal stricture who has required serial dilation, the last time being in 2020.  With worsened symptoms again, she presents for dilation.  Past Medical History:  Diagnosis Date   Chronic systolic CHF (congestive heart failure) (HCC)    a. 2D ECHO on 02/10/15 w/ severe LV dysfunction with EF 25-30%. Mild MR and mild LAE. negative nuclear stress test, felt to be due to tachycardia    Dementia (HCC)    Dyspnea    with exertion   Dysrhythmia    PAF   Esophageal stricture    a. requiring dilations. crushes pills to take PO   Full dentures    GERD (gastroesophageal reflux disease)    Hypertension    Osteoporosis    sees Dr. Ardyth Harps    PAF (paroxysmal atrial fibrillation) [facility])    a. s/p succesfful DCCV on 02/13/15   Pneumonia    (08/15/16)- many years ago   Sebaceous cyst    on back of neck, has seen Dr. Bertram Savin   Sleep apnea    does not use CPAP    Past Surgical History:  Procedure Laterality Date   ATRIAL FIBRILLATION ABLATION N/A 01/16/2021   Procedure: ATRIAL FIBRILLATION ABLATION;  Surgeon: Hillis Range, MD;  [facility];  Service: Cardiovascular;  Laterality: N/A;   ATRIAL FIBRILLATION ABLATION N/A 06/03/2022   Procedure: ATRIAL FIBRILLATION ABLATION;  Surgeon: Lanier Prude, MD;  Location: [facility];  Service: Cardiovascular;  Laterality: N/A;   BALLOON DILATION N/A 06/22/2018   Procedure: ESOPHAGEAL BALLOON DILATION;  Surgeon: Christia Reading, MD;  Location: [facility];  Service: ENT;  Laterality: N/A;   BREAST SURGERY Bilateral    Breast Implants   CARDIOVERSION N/A 02/13/2015   Procedure: CARDIOVERSION;  Surgeon: Chrystie Nose, MD;  Location: [facility]COPY;  Service: Cardiovascular;  Laterality: N/A;   CARDIOVERSION N/A 09/16/2022   Procedure: CARDIOVERSION;  Surgeon: Christell Constant, MD;   [facility];  Service: Cardiovascular;  Laterality: N/A;   COLONOSCOPY  09/24/2002   ESOPHAGEAL DILATION N/A 08/19/2016   Procedure: ESOPHAGEAL DILATION;  Surgeon: Christia Reading, MD;  Location: [facility] OR;  Service: ENT;  Laterality: N/A;  esophagoscopy with balloon dilation   ESOPHAGOGASTRODUODENOSCOPY  06/01/2003   12/13   ESOPHAGOGASTRODUODENOSCOPY (EGD) WITH ESOPHAGEAL DILATION     ESOPHAGOSCOPY WITH DILITATION N/A 06/16/2017   Procedure: ESOPHAGOSCOPY WITH BALLOON DILITATION;  Surgeon: Christia Reading, MD;  Location: [facility];  Service: ENT;  Laterality: N/A;   RIGID ESOPHAGOSCOPY N/A 01/18/2013   Procedure: RIGID ESOPHAGOSCOPY WITH ESPHAGEAL Balloon DILATION ;  Surgeon: Christia Reading, MD;  Location: [facility];  Service: ENT;  Laterality: N/A;   RIGID ESOPHAGOSCOPY N/A 06/22/2018   Procedure: RIGID ESOPHAGOSCOPY;  Surgeon: Christia Reading, MD;  Location[facility];  Service: ENT;  Laterality: N/A;   TEE WITHOUT CARDIOVERSION N/A 02/13/2015   Procedure: TRANSESOPHAGEAL ECHOCARDIOGRAM (TEE);  Surgeon: Chrystie Nose, MD;  Location: [facility] END[facility]COPY;  Service: Cardiovascular;  Laterality: N/A;    Family History  Problem Relation Age of Onset   Heart attack Mother 66       died from massive heart attack   Aneurysm Father        cerebral   Hypertension Sister    Diabetes type II Sister    Heart attack  Brother    Hypertension Sister    Heart attack Brother    Stroke Neg Hx    Social History:  reports that she has never smoked. She has never used smokeless tobacco. She reports that she does not drink alcohol and does not use drugs.  Allergies:  Allergies  Allergen Reactions   Ramipril Cough    Medications Prior to Admission  Medication Sig Dispense Refill   amiodarone (PACERONE) 200 MG tablet Take 1 tablet (200 mg total) by mouth daily. 90 tablet 3   chlorthalidone (HYGROTON) 25 MG tablet TAKE 1/2 TABLET BY MOUTH DAILY 45 tablet 3    lansoprazole (PREVACID) 30 MG capsule Take 1 capsule (30 mg total) by mouth daily. 90 capsule 3   losartan (COZAAR) 100 MG tablet TAKE 1 TABLET BY MOUTH EVERY DAY (Patient taking differently: Take 50 mg by mouth daily.) 90 tablet 3   metoprolol tartrate (LOPRESSOR) 25 MG tablet Take 1 tablet (25 mg total) by mouth 2 (two) times daily. 180 tablet 1   XARELTO 15 MG TABS tablet TAKE 1 TABLET (15 MG TOTAL) BY MOUTH DAILY WITH SUPPER 90 tablet 1    Results for orders placed or performed during the hospital encounter of 02/10/23 (from the past 48 hour(s))  Basic metabolic panel per protocol     Status: Abnormal   Collection Time: 02/10/23 10:17 AM  Result Value Ref Range   Sodium 137 135 - 145 mmol/L   Potassium 3.8 3.5 - 5.1 mmol/L   Chloride 104 98 - 111 mmol/L   CO2 25 22 - 32 mmol/L   Glucose, Bld 106 (H) 70 - 99 mg/dL    Comment: Glucose reference range applies only to samples taken after fasting for at least 8 hours.   BUN 15 8 - 23 mg/dL   Creatinine, Ser 1.61 (H) 0.44 - 1.00 mg/dL   Calcium 8.9 8.9 - 09.6 mg/dL   GFR, Estimated 50 (L) >60 mL/min    Comment: (NOTE) Calculated using the CKD-EPI Creatinine Equation (2021)    Anion gap 8 5 - 15    Comment: Performed at [facility], [address], [place], [place] [address]  CBC per protocol     Status: None   Collection Time: 02/10/23 10:17 AM  Result Value Ref Range   WBC 6.3 4.0 - 10.5 K/uL   RBC 4.33 3.87 - 5.11 MIL/uL   Hemoglobin 12.7 12.0 - 15.0 g/dL   HCT 98.1 19.1 - 47.8 %   MCV 94.5 80.0 - 100.0 fL   MCH 29.3 26.0 - 34.0 pg   MCHC 31.1 30.0 - 36.0 g/dL   RDW 29.5 62.1 - 30.8 %   Platelets 219 150 - 400 K/uL   nRBC 0.0 0.0 - 0.2 %    Comment: Performed at [facility], [address], [place], [place] [address]   No results found.  Review of Systems  HENT:  Positive for trouble swallowing.   All other systems reviewed and are negative.   Blood pressure (!) 241/88, pulse (!) 56, temperature 98 F  (36.7 C), temperature source Oral, resp. rate 20, height 5' (1.524 m), weight 44.5 kg, SpO2 99%. Physical Exam Constitutional:      Appearance: Normal appearance. She is normal weight.  HENT:     Head: Normocephalic and atraumatic.     Right Ear: External ear normal.     Left Ear: External ear normal.     Nose: Nose normal.  Mouth/Throat:     Mouth: Mucous membranes are moist.     Pharynx: Oropharynx is clear.  Eyes:     Extraocular Movements: Extraocular movements intact.     Conjunctiva/sclera: Conjunctivae normal.     Pupils: Pupils are equal, round, and reactive to light.  Cardiovascular:     Rate and Rhythm: Normal rate.  Pulmonary:     Effort: Pulmonary effort is normal.  Musculoskeletal:     Cervical back: Normal range of motion.  Skin:    General: Skin is warm and dry.  Neurological:     General: No focal deficit present.     Mental Status: She is alert and oriented to person, place, and time.  Psychiatric:        Mood and Affect: Mood normal.        Behavior: Behavior normal.        Thought Content: Thought content normal.        Judgment: Judgment normal.      Assessment/Plan Esophageal stricture  To OR for esophagoscopy with dilation.  Christia Reading, MD 02/10/2023, 11:58 AM

## 2023-02-10 NOTE — Brief Op Note (Signed)
02/10/2023  12:45 PM  PATIENT:  Mackenzie [name]  72 y.o. female  PRE-OPERATIVE DIAGNOSIS:  Esophageal stricture; Esophageal dysphagia  POST-OPERATIVE DIAGNOSIS:  Esophageal stricture; Esophageal dysphagia  PROCEDURE:  Procedure(s): RIGID ESOPHAGOSCOPY (N/A) BALLOON DILATION (N/A)  SURGEON:  Surgeons and Role:    Christia Reading, MD - Primary  PHYSICIAN ASSISTANT:   ASSISTANTS: none   ANESTHESIA:   general  EBL:  Minimal   BLOOD ADMINISTERED:none  DRAINS: none   LOCAL MEDICATIONS USED:  NONE  SPECIMEN:  No Specimen  DISPOSITION OF SPECIMEN:  N/A  COUNTS:  YES  TOURNIQUET:  * No tourniquets in log *  DICTATION: .Note written in EPIC  PLAN OF CARE: Discharge to home after PACU  PATIENT DISPOSITION:  PACU - hemodynamically stable.   Delay start of Pharmacological VTE agent (>24hrs) due to surgical blood loss or risk of bleeding: no

## 2023-02-11 ENCOUNTER — Encounter (HOSPITAL_COMMUNITY): Payer: Self-pay | Admitting: Otolaryngology

## 2023-02-26 DIAGNOSIS — K222 Esophageal obstruction: Secondary | ICD-10-CM | POA: Diagnosis not present

## 2023-03-28 ENCOUNTER — Other Ambulatory Visit: Payer: Self-pay | Admitting: Cardiology

## 2023-05-08 NOTE — Progress Notes (Deleted)
 Cardiology Office Note   Date:  05/08/2023   ID:  Mackenzie, [name] 18-Oct-1950, MRN [id number]  PCP:  Nelwyn Salisbury, MD  Cardiologist:  Herson Prichard [name], MD EP: Lanier Prude, MD  No chief complaint on file.      History of Present Illness: Mackenzie [name] is a 73 y.o. 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  She was evaluated by me on 02/24/2020 at which time she was doing okay from a cardiology standpoint.  She reported episodes of atrial fibrillation approximately once a month which would last for up to 3 days.  She had a particularly bad episode just prior to her visit with associated nausea, indigestion, and palpitations; felt very weak.  We discussed consideration for an ablation, however patient preferred watchful waiting and if more severe episodes would consider at that time.  Atrial fibrillation history dates back to 2016 when she was diagnosed after presenting with shortness of breath and palpitations.  Echo at that time showed EF 25 to 30%.  Nuclear stress test at that time showed no ischemia.  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  She underwent TEE/DCCV with successful restoration of NSR, however had recurrent A. fib within 1 month.  She was started on amiodarone at that time.  She saw Dr. Johney Frame with electrophysiology in 2020 for consideration of an ablation though did not go forward with scheduled procedure. She had subsequent recovery in her EF with sinus rhythm. Latest CT scan showed bibasilar scarring. She was later seen by Dr Johney Frame with recurrent Afib despite amiodarone. She had ablation 01/16/21. Amiodarone was later discontinued.   In February 2022 was seen in Afib clinic with increased Afib burden. She was scheduled for cardioversion but converted on her own. She did have a sleep study showing mild OSA.   She reports she tried  CPAP but hasn't been able to tolerate the mask.   She was seen  by Dr Lalla Brothers for her recurrent Afib. Not felt to be a good candidate for Tikosyn due to CKD. Discussed trying amiodarone versus repeat ablation.  She underwent afib ablation 06/03/22. Unfortunately, she was back in afib 2 days post ablation, confirmed on her [name] mobile device. She states she felt great for 1-2 days post procedure. She remained in Afib when seen in clinic on March 12. She was seen 09/11/22 by Dr. Lalla Brothers and planned for cardioversion. She underwent DCCV with 200j on 09/16/22 with thought that if she went back into AF, she would be considered permanent atrial fibrillation and transition focus to rate control. She converted to SR but noted 6/10 she had returned to AF. She was seen in the AF Clinic 6/12, at that time discussion was related to initiating amiodarone and re-trial of DCCV. She has maintained on Xarelto, lopressor and follows her rhythm with [name]. She noted feeling much better in NSR so decision was made to reload with oral amiodarone and repeat DCCV planned for August 18. Amiodarone was reloaded on July 16. When I saw her she was back in NSR and DCCV was canceled.  She did undergo esophageal dilation in October.    Past Medical History:  Diagnosis Date   Chronic systolic CHF (congestive heart failure) (HCC)    a. 2D ECHO on 02/10/15 w/ severe LV dysfunction with EF 25-30%. Mild MR and mild LAE. negative nuclear stress test,  felt to be due to tachycardia    Dementia (HCC)    Dyspnea    with exertion   Dysrhythmia    PAF   Esophageal stricture    a. requiring dilations. crushes pills to take PO   Full dentures    GERD (gastroesophageal reflux disease)    Hypertension    Osteoporosis    sees Dr. Ardyth Harps    PAF (paroxysmal atrial fibrillation) [facility])    a. s/p succesfful DCCV on 02/13/15   Pneumonia    (08/15/16)- many years ago   Sebaceous cyst    on back of neck, has seen Dr. Bertram Savin    Sleep apnea    does not use CPAP    Past Surgical History:  Procedure Laterality Date   ATRIAL FIBRILLATION ABLATION N/A 01/16/2021   Procedure: ATRIAL FIBRILLATION ABLATION;  Surgeon: Hillis Range, MD;  [facility];  Service: Cardiovascular;  Laterality: N/A;   ATRIAL FIBRILLATION ABLATION N/A 06/03/2022   Procedure: ATRIAL FIBRILLATION ABLATION;  Surgeon: Lanier Prude, MD;  [facility];  Service: Cardiovascular;  Laterality: N/A;   BALLOON DILATION N/A 06/22/2018   Procedure: ESOPHAGEAL BALLOON DILATION;  Surgeon: Christia Reading, MD;  Location: [facility];  Service: ENT;  Laterality: N/A;   BALLOON DILATION N/A 02/10/2023   Procedure: [place];  Surgeon: Christia Reading, MD;  Location: [facility] OR;  Service: ENT;  Laterality: N/A;   BREAST SURGERY Bilateral    Breast Implants   CARDIOVERSION N/A 02/13/2015   Procedure: CARDIOVERSION;  Surgeon: Chrystie Nose, MD;  Location: [facility] ENDOSCOPY;  Service: Cardiovascular;  Laterality: N/A;   CARDIOVERSION N/A 09/16/2022   Procedure: CARDIOVERSION;  Surgeon: Christell Constant, MD;  [facility];  Service: Cardiovascular;  Laterality: N/A;   COLONOSCOPY  09/24/2002   ESOPHAGEAL DILATION N/A 08/19/2016   Procedure: ESOPHAGEAL DILATION;  Surgeon: Christia Reading, MD;  Location: [facility] OR;  Service: ENT;  Laterality: N/A;  esophagoscopy with balloon dilation   ESOPHAGOGASTRODUODENOSCOPY  06/01/2003   12/13   ESOPHAGOGASTRODUODENOSCOPY (EGD) WITH ESOPHAGEAL DILATION     ESOPHAGOSCOPY N/A 02/10/2023   Procedure: RIGID ESOPHAGOSCOPY;  Surgeon: Christia Reading, MD;  Location: [facility] OR;  Service: ENT;  Laterality: N/A;   ESOPHAGOSCOPY WITH DILITATION N/A 06/16/2017   Procedure: ESOPHAGOSCOPY WITH BALLOON DILITATION;  Surgeon: Christia Reading, MD;  Location: [facility];  Service: ENT;  Laterality: N/A;   RIGID ESOPHAGOSCOPY N/A 01/18/2013   Procedure: RIGID ESOPHAGOSCOPY WITH ESPHAGEAL  Balloon DILATION ;  Surgeon: Christia Reading, MD;  Location: [facility];  Service: ENT;  Laterality: N/A;   RIGID ESOPHAGOSCOPY N/A 06/22/2018   Procedure: RIGID ESOPHAGOSCOPY;  Surgeon: Christia Reading, MD;  Location:  SURGERY CENTER;  Service: ENT;  Laterality: N/A;   TEE WITHOUT CARDIOVERSION N/A 02/13/2015   Procedure: TRANSESOPHAGEAL ECHOCARDIOGRAM (TEE);  Surgeon: Chrystie Nose, MD;  Location: [facility] ENDOSCOPY;  Service: Cardiovascular;  Laterality: N/A;     Current Outpatient Medications  Medication Sig Dispense Refill   amiodarone (PACERONE) 200 MG tablet Take 1 tablet (200 mg total) by mouth daily. 90 tablet 3   chlorthalidone (HYGROTON) 25 MG tablet TAKE 1/2 TABLET BY MOUTH DAILY 45 tablet 3   lansoprazole (PREVACID) 30 MG capsule Take 1 capsule (30 mg total) by mouth daily. 90 capsule 3   losartan (COZAAR) 100 MG tablet TAKE 1 TABLET BY MOUTH EVERY DAY (Patient taking differently: Take 50 mg by mouth daily.)  90 tablet 3   metoprolol tartrate (LOPRESSOR) 25 MG tablet TAKE 1 TABLET BY MOUTH TWICE A DAY 180 tablet 1   XARELTO 15 MG TABS tablet TAKE 1 TABLET (15 MG TOTAL) BY MOUTH DAILY WITH SUPPER 90 tablet 1   No current facility-administered medications for this visit.    Allergies:   Ramipril    Social History:  The patient  reports that she has never smoked. She has never used smokeless tobacco. She reports that she does not drink alcohol and does not use drugs.   Family History:  The patient's family history includes Aneurysm in her father; Diabetes type II in her sister; Heart attack in her brother and brother; Heart attack (age of onset: 10) in her mother; Hypertension in her sister and sister.    ROS:  Please see the history of present illness.   Otherwise, review of systems are positive for none.   All other systems are reviewed and negative.    PHYSICAL EXAM: VS:  There were no vitals taken for this visit. , BMI There is no height or weight on file to  calculate BMI. GEN: Well nourished, well developed, in no acute distress HEENT: sclera anicteric Neck: no JVD, carotid bruits, or masses Cardiac: IRRR; no murmurs, rubs, or gallops, tr edema  Respiratory:  clear to auscultation bilaterally, normal work of breathing GI: soft, nontender, nondistended, + BS MS: no deformity or atrophy Skin: warm and dry, no rash Neuro:  Strength and sensation are intact Psych: euthymic mood, full affect          Recent Labs: 06/26/2022: ALT 12; TSH 2.53 02/10/2023: BUN 15; Creatinine, Ser 1.15; Hemoglobin 12.7; Platelets 219; Potassium 3.8; Sodium 137    Lipid Panel    Component Value Date/Time   CHOL 183 06/26/2022 0843   CHOL 205 (H) 02/22/2019 1117   TRIG 98.0 06/26/2022 0843   HDL 57.40 06/26/2022 0843   HDL 77 02/22/2019 1117   CHOLHDL 3 06/26/2022 0843   VLDL 19.6 06/26/2022 0843   LDLCALC 106 (H) 06/26/2022 0843   LDLCALC 112 (H) 02/22/2019 1117   LDLDIRECT 121.0 03/14/2010 0931      Wt Readings from Last 3 Encounters:  02/10/23 98 lb (44.5 kg)  11/12/22 98 lb 6.4 oz (44.6 kg)  11/12/22 97 lb (44 kg)      Other studies Reviewed: Additional studies/ records that were reviewed today include:   Echocardiogram 2019: Study Conclusions   - Left ventricle: The cavity size was normal. Wall thickness was    normal. Systolic function was normal. The estimated ejection    fraction was in the range of 55% to 60%. Wall motion was normal;    there were no regional wall motion abnormalities. Doppler    parameters are consistent with abnormal left ventricular    relaxation (grade 2 diastolic dysfunction). The E/e&' ratio is    >15, suggesting elevated LV filling pressure.  - Mitral valve: Mildly thickened leaflets . There was trivial    regurgitation.  - Left atrium: The atrium was normal in size.  - Tricuspid valve: There was mild regurgitation.  - Pulmonary arteries: PA peak pressure: 34 mm Hg (S).  - Inferior vena cava: The  vessel was normal in size. The    respirophasic diameter changes were in the normal range (>= 50%),    consistent with normal central venous pressure.   Impressions:   - Compared to a prior study in 2017, there are no significant  changes.   NST 2016: 1. No reversible ischemia or infarction. Small, mild fixed defect along the anterior apex.   2. Global hypokinesis.   3.  Decreased left ventricular ejection fraction of 21%.   4. High-risk stress test findings*.  Echo 10/27/20: IMPRESSIONS     1. Left ventricular ejection fraction, by estimation, is 60 to 65%. The  left ventricle has normal function. The left ventricle has no regional  wall motion abnormalities. There is mild asymmetric left ventricular  hypertrophy. Left ventricular diastolic  parameters are indeterminate. Elevated left ventricular end-diastolic  pressure.   2. Right ventricular systolic function is normal. The right ventricular  size is normal.   3. The mitral valve is normal in structure. Trivial mitral valve  regurgitation.   4. The aortic valve is normal in structure. Aortic valve regurgitation is  trivial.   Echo 4//17/24: IMPRESSIONS     1. Left ventricular ejection fraction, by estimation, is 50%. The left  ventricle has mildly decreased function. The left ventricle demonstrates  global hypokinesis. There is mild concentric left ventricular hypertrophy.  Left ventricular diastolic  parameters are indeterminate.   2. Right ventricular systolic function is mildly reduced. The right  ventricular size is normal. There is mildly elevated pulmonary artery  systolic pressure. The estimated right ventricular systolic pressure is  38.5 mmHg.   3. Left atrial size was mildly dilated.   4. The mitral valve is normal in structure. Trivial mitral valve  regurgitation. No evidence of mitral stenosis.   5. The aortic valve is tricuspid. Aortic valve regurgitation is not  visualized. No aortic stenosis is  present.   6. The inferior vena cava is dilated in size with >50% respiratory  variability, suggesting right atrial pressure of 8 mmHg.   7. The patient was in atrial fibrillation.   ASSESSMENT AND PLAN: Paroxysmal Afib- S/p Afib ablation in October 2022. - Recurrent Afib  -Continue Xarelto for stroke ppx.   - Continue metoprolol for rate control - s/p repeat Ablation on Feb 19. Repeat DCCV in June but returned to Afib. Now on amiodarone for past 2 weeks and has converted back to NSR - continue amiodarone 200 mg daily - follow up in Afib clinic end of August. Will cancel DCCV.    2.  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  - most recent Echo with EF 50%  - Continue chlorthalidone, losartan, and metoprolol  3.  Labile hypertension:  - continue losartan, chlorthalidone,  and metoprolol - reports BP at home has been good. 130/80 today  4.  Insomnia/daytime somnolence/snoring:  - OSA noted on sleep study - reports intolerance of CPAP  5.  Right arm/shoulder pain. History c/w musculoskeletal pain. Follow up with PCP      Disposition:   FU with me in 6 months  Signed, Stryder Poitra [name], MD  05/08/2023 4:47 PM

## 2023-05-15 ENCOUNTER — Ambulatory Visit: Payer: BC Managed Care – PPO | Admitting: Cardiology

## 2023-05-20 NOTE — Progress Notes (Deleted)
 Cardiology Office Note   Date:  05/20/2023   ID:  Mackenzie, [name] February 05, 1951, MRN [id number]  PCP:  Mackenzie Salisbury, MD  Cardiologist:  Mackenzie Mutz [name], MD EP: Mackenzie Prude, MD  No chief complaint on file.      History of Present Illness: Mackenzie [name] is a 73 y.o. 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  She was evaluated by me on 02/24/2020 at which time she was doing okay from a cardiology standpoint.  She reported episodes of atrial fibrillation approximately once a month which would last for up to 3 days.  She had a particularly bad episode just prior to her visit with associated nausea, indigestion, and palpitations; felt very weak.  We discussed consideration for an ablation, however patient preferred watchful waiting and if more severe episodes would consider at that time.  Atrial fibrillation history dates back to 2016 when she was diagnosed after presenting with shortness of breath and palpitations.  Echo at that time showed EF 25 to 30%.  Nuclear stress test at that time showed no ischemia.  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  She underwent TEE/DCCV with successful restoration of NSR, however had recurrent A. fib within 1 month.  She was started on amiodarone at that time.  She saw Dr. Johney Frame with electrophysiology in 2020 for consideration of an ablation though did not go forward with scheduled procedure. She had subsequent recovery in her EF with sinus rhythm. Latest CT scan showed bibasilar scarring. She was later seen by Dr Johney Frame with recurrent Afib despite amiodarone. She had ablation 01/16/21. Amiodarone was later discontinued.   In February 2022 was seen in Afib clinic with increased Afib burden. She was scheduled for cardioversion but converted on her own. She did have a sleep study showing mild OSA.   She reports she tried  CPAP but hasn't been able to tolerate the mask.   She was seen  by Dr Lalla Brothers for her recurrent Afib. Not felt to be a good candidate for Tikosyn due to CKD. Discussed trying amiodarone versus repeat ablation.  She underwent afib ablation 06/03/22. Unfortunately, she was back in afib 2 days post ablation, confirmed on her [name] mobile device. She states she felt great for 1-2 days post procedure. She remained in Afib when seen in clinic on March 12. She was seen 09/11/22 by Dr. Lalla Brothers and planned for cardioversion. She underwent DCCV with 200j on 09/16/22 with thought that if she went back into AF, she would be considered permanent atrial fibrillation and transition focus to rate control. She converted to SR but noted 6/10 she had returned to AF. She was seen in the AF Clinic 6/12, at that time discussion was related to initiating amiodarone and re-trial of DCCV. She has maintained on Xarelto, lopressor and follows her rhythm with [name]. She noted feeling much better in NSR so decision was made to reload with oral amiodarone and repeat DCCV planned for August 18. Amiodarone was reloaded on July 16. When I saw her she was back in NSR and DCCV was canceled.  She did undergo esophageal dilation in October.    Past Medical History:  Diagnosis Date   Chronic systolic CHF (congestive heart failure) (HCC)    a. 2D ECHO on 02/10/15 w/ severe LV dysfunction with EF 25-30%. Mild MR and mild LAE. negative nuclear stress test,  felt to be due to tachycardia    Dementia (HCC)    Dyspnea    with exertion   Dysrhythmia    PAF   Esophageal stricture    a. requiring dilations. crushes pills to take PO   Full dentures    GERD (gastroesophageal reflux disease)    Hypertension    Osteoporosis    sees Dr. Ardyth Harps    PAF (paroxysmal atrial fibrillation) [facility])    a. s/p succesfful DCCV on 02/13/15   Pneumonia    (08/15/16)- many years ago   Sebaceous cyst    on back of neck, has seen Dr. Bertram Savin    Sleep apnea    does not use CPAP    Past Surgical History:  Procedure Laterality Date   ATRIAL FIBRILLATION ABLATION N/A 01/16/2021   Procedure: ATRIAL FIBRILLATION ABLATION;  Surgeon: Hillis Range, MD;  Location: [facility];  Service: Cardiovascular;  Laterality: N/A;   ATRIAL FIBRILLATION ABLATION N/A 06/03/2022   Procedure: ATRIAL FIBRILLATION ABLATION;  Surgeon: Mackenzie Prude, MD;  Location: [facility];  Service: Cardiovascular;  Laterality: N/A;   BALLOON DILATION N/A 06/22/2018   Procedure: ESOPHAGEAL BALLOON DILATION;  Surgeon: Christia Reading, MD;  Location: [facility];  Service: ENT;  Laterality: N/A;   BALLOON DILATION N/A 02/10/2023   Procedure: [place];  Surgeon: Christia Reading, MD;  Location: [facility] OR;  Service: ENT;  Laterality: N/A;   BREAST SURGERY Bilateral    Breast Implants   CARDIOVERSION N/A 02/13/2015   Procedure: CARDIOVERSION;  Surgeon: Chrystie Nose, MD;  Location: [facility] ENDOSCOPY;  Service: Cardiovascular;  Laterality: N/A;   CARDIOVERSION N/A 09/16/2022   Procedure: CARDIOVERSION;  Surgeon: Christell Constant, MD;  Location: [facility];  Service: Cardiovascular;  Laterality: N/A;   COLONOSCOPY  09/24/2002   ESOPHAGEAL DILATION N/A 08/19/2016   Procedure: ESOPHAGEAL DILATION;  Surgeon: Christia Reading, MD;  Location: [facility] OR;  Service: ENT;  Laterality: N/A;  esophagoscopy with balloon dilation   ESOPHAGOGASTRODUODENOSCOPY  06/01/2003   12/13   ESOPHAGOGASTRODUODENOSCOPY (EGD) WITH ESOPHAGEAL DILATION     ESOPHAGOSCOPY N/A 02/10/2023   Procedure: RIGID ESOPHAGOSCOPY;  Surgeon: Christia Reading, MD;  Location: [facility] OR;  Service: ENT;  Laterality: N/A;   ESOPHAGOSCOPY WITH DILITATION N/A 06/16/2017   Procedure: ESOPHAGOSCOPY WITH BALLOON DILITATION;  Surgeon: Christia Reading, MD;  Location: [facility];  Service: ENT;  Laterality: N/A;   RIGID ESOPHAGOSCOPY N/A 01/18/2013   Procedure: RIGID ESOPHAGOSCOPY WITH ESPHAGEAL  Balloon DILATION ;  Surgeon: Christia Reading, MD;  Location: [facility];  Service: ENT;  Laterality: N/A;   RIGID ESOPHAGOSCOPY N/A 06/22/2018   Procedure: RIGID ESOPHAGOSCOPY;  Surgeon: Christia Reading, MD;  Location: [facility];  Service: ENT;  Laterality: N/A;   TEE WITHOUT CARDIOVERSION N/A 02/13/2015   Procedure: TRANSESOPHAGEAL ECHOCARDIOGRAM (TEE);  Surgeon: Chrystie Nose, MD;  Location: [facility] ENDOSCOPY;  Service: Cardiovascular;  Laterality: N/A;     Current Outpatient Medications  Medication Sig Dispense Refill   amiodarone (PACERONE) 200 MG tablet Take 1 tablet (200 mg total) by mouth daily. 90 tablet 3   chlorthalidone (HYGROTON) 25 MG tablet TAKE 1/2 TABLET BY MOUTH DAILY 45 tablet 3   lansoprazole (PREVACID) 30 MG capsule Take 1 capsule (30 mg total) by mouth daily. 90 capsule 3   losartan (COZAAR) 100 MG tablet TAKE 1 TABLET BY MOUTH EVERY DAY (Patient taking differently: Take 50 mg by mouth daily.)  90 tablet 3   metoprolol tartrate (LOPRESSOR) 25 MG tablet TAKE 1 TABLET BY MOUTH TWICE A DAY 180 tablet 1   XARELTO 15 MG TABS tablet TAKE 1 TABLET (15 MG TOTAL) BY MOUTH DAILY WITH SUPPER 90 tablet 1   No current facility-administered medications for this visit.    Allergies:   Ramipril    Social History:  The patient  reports that she has never smoked. She has never used smokeless tobacco. She reports that she does not drink alcohol and does not use drugs.   Family History:  The patient's family history includes Aneurysm in her father; Diabetes type II in her sister; Heart attack in her brother and brother; Heart attack (age of onset: 75) in her mother; Hypertension in her sister and sister.    ROS:  Please see the history of present illness.   Otherwise, review of systems are positive for none.   All other systems are reviewed and negative.    PHYSICAL EXAM: VS:  There were no vitals taken for this visit. , BMI There is no height or weight on file to  calculate BMI. GEN: Well nourished, well developed, in no acute distress HEENT: sclera anicteric Neck: no JVD, carotid bruits, or masses Cardiac: IRRR; no murmurs, rubs, or gallops, tr edema  Respiratory:  clear to auscultation bilaterally, normal work of breathing GI: soft, nontender, nondistended, + BS MS: no deformity or atrophy Skin: warm and dry, no rash Neuro:  Strength and sensation are intact Psych: euthymic mood, full affect          Recent Labs: 06/26/2022: ALT 12; TSH 2.53 02/10/2023: BUN 15; Creatinine, Ser 1.15; Hemoglobin 12.7; Platelets 219; Potassium 3.8; Sodium 137    Lipid Panel    Component Value Date/Time   CHOL 183 06/26/2022 0843   CHOL 205 (H) 02/22/2019 1117   TRIG 98.0 06/26/2022 0843   HDL 57.40 06/26/2022 0843   HDL 77 02/22/2019 1117   CHOLHDL 3 06/26/2022 0843   VLDL 19.6 06/26/2022 0843   LDLCALC 106 (H) 06/26/2022 0843   LDLCALC 112 (H) 02/22/2019 1117   LDLDIRECT 121.0 03/14/2010 0931      Wt Readings from Last 3 Encounters:  02/10/23 98 lb (44.5 kg)  11/12/22 98 lb 6.4 oz (44.6 kg)  11/12/22 97 lb (44 kg)      Other studies Reviewed: Additional studies/ records that were reviewed today include:   Echocardiogram 2019: Study Conclusions   - Left ventricle: The cavity size was normal. Wall thickness was    normal. Systolic function was normal. The estimated ejection    fraction was in the range of 55% to 60%. Wall motion was normal;    there were no regional wall motion abnormalities. Doppler    parameters are consistent with abnormal left ventricular    relaxation (grade 2 diastolic dysfunction). The E/e&' ratio is    >15, suggesting elevated LV filling pressure.  - Mitral valve: Mildly thickened leaflets . There was trivial    regurgitation.  - Left atrium: The atrium was normal in size.  - Tricuspid valve: There was mild regurgitation.  - Pulmonary arteries: PA peak pressure: 34 mm Hg (S).  - Inferior vena cava: The  vessel was normal in size. The    respirophasic diameter changes were in the normal range (>= 50%),    consistent with normal central venous pressure.   Impressions:   - Compared to a prior study in 2017, there are no significant  changes.   NST 2016: 1. No reversible ischemia or infarction. Small, mild fixed defect along the anterior apex.   2. Global hypokinesis.   3.  Decreased left ventricular ejection fraction of 21%.   4. High-risk stress test findings*.  Echo 10/27/20: IMPRESSIONS     1. Left ventricular ejection fraction, by estimation, is 60 to 65%. The  left ventricle has normal function. The left ventricle has no regional  wall motion abnormalities. There is mild asymmetric left ventricular  hypertrophy. Left ventricular diastolic  parameters are indeterminate. Elevated left ventricular end-diastolic  pressure.   2. Right ventricular systolic function is normal. The right ventricular  size is normal.   3. The mitral valve is normal in structure. Trivial mitral valve  regurgitation.   4. The aortic valve is normal in structure. Aortic valve regurgitation is  trivial.   Echo 4//17/24: IMPRESSIONS     1. Left ventricular ejection fraction, by estimation, is 50%. The left  ventricle has mildly decreased function. The left ventricle demonstrates  global hypokinesis. There is mild concentric left ventricular hypertrophy.  Left ventricular diastolic  parameters are indeterminate.   2. Right ventricular systolic function is mildly reduced. The right  ventricular size is normal. There is mildly elevated pulmonary artery  systolic pressure. The estimated right ventricular systolic pressure is  38.5 mmHg.   3. Left atrial size was mildly dilated.   4. The mitral valve is normal in structure. Trivial mitral valve  regurgitation. No evidence of mitral stenosis.   5. The aortic valve is tricuspid. Aortic valve regurgitation is not  visualized. No aortic stenosis is  present.   6. The inferior vena cava is dilated in size with >50% respiratory  variability, suggesting right atrial pressure of 8 mmHg.   7. The patient was in atrial fibrillation.   ASSESSMENT AND PLAN: Paroxysmal Afib- S/p Afib ablation in October 2022. - Recurrent Afib  -Continue Xarelto for stroke ppx.   - Continue metoprolol for rate control - s/p repeat Ablation on Feb 19. Repeat DCCV in June but returned to Afib. Now on amiodarone for past 2 weeks and has converted back to NSR - continue amiodarone 200 mg daily - follow up in Afib clinic end of August. Will cancel DCCV.    2.  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  - most recent Echo with EF 50%  - Continue chlorthalidone, losartan, and metoprolol  3.  Labile hypertension:  - continue losartan, chlorthalidone,  and metoprolol - reports BP at home has been good. 130/80 today  4.  Insomnia/daytime somnolence/snoring:  - OSA noted on sleep study - reports intolerance of CPAP  5.  Right arm/shoulder pain. History c/w musculoskeletal pain. Follow up with PCP      Disposition:   FU with me in 6 months  Signed, Prudence Heiny [name], MD  05/20/2023 7:59 AM

## 2023-05-22 ENCOUNTER — Other Ambulatory Visit: Payer: Self-pay | Admitting: Cardiology

## 2023-05-22 ENCOUNTER — Other Ambulatory Visit: Payer: Self-pay | Admitting: Family Medicine

## 2023-05-22 DIAGNOSIS — K219 Gastro-esophageal reflux disease without esophagitis: Secondary | ICD-10-CM

## 2023-05-22 DIAGNOSIS — I4819 Other persistent atrial fibrillation: Secondary | ICD-10-CM

## 2023-05-22 NOTE — Telephone Encounter (Signed)
 Prescription refill request for Xarelto  received.  Indication: AFIB  Last office visit: Mackenzie [name], 11/12/2022 Weight: 44.5 kg  Age: 73 yo  Scr: 1.15, 02/10/2023 CrCl: 31 ml/min

## 2023-05-26 ENCOUNTER — Ambulatory Visit: Payer: BC Managed Care – PPO | Admitting: Cardiology

## 2023-05-28 ENCOUNTER — Ambulatory Visit: Payer: BC Managed Care – PPO | Attending: Cardiology | Admitting: Cardiology

## 2023-05-28 ENCOUNTER — Encounter: Payer: Self-pay | Admitting: Cardiology

## 2023-05-28 VITALS — BP 200/94 | HR 59 | Ht 60.0 in | Wt 103.2 lb

## 2023-05-28 DIAGNOSIS — I10 Essential (primary) hypertension: Secondary | ICD-10-CM | POA: Diagnosis not present

## 2023-05-28 DIAGNOSIS — R0989 Other specified symptoms and signs involving the circulatory and respiratory systems: Secondary | ICD-10-CM | POA: Diagnosis not present

## 2023-05-28 DIAGNOSIS — I480 Paroxysmal atrial fibrillation: Secondary | ICD-10-CM | POA: Diagnosis not present

## 2023-05-28 DIAGNOSIS — I5022 Chronic systolic (congestive) heart failure: Secondary | ICD-10-CM

## 2023-05-28 MED ORDER — CARVEDILOL 25 MG PO TABS: 25.0000 mg | ORAL_TABLET | Freq: Two times a day (BID) | ORAL | 3 refills | Status: DC

## 2023-05-28 NOTE — Progress Notes (Signed)
Cardiology Office Note   Date:  05/28/2023   ID:  Mackenzie, [name] 08/21/1950, MRN [id number]  PCP:  Nelwyn Salisbury, MD  Cardiologist:  Solon Alban [name], MD EP: Lanier Prude, MD  No chief complaint on file.      History of Present Illness: Mackenzie [name] is a 73 y.o. 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  She was evaluated by me on 02/24/2020 at which time she was doing okay from a cardiology standpoint.  She reported episodes of atrial fibrillation approximately once a month which would last for up to 3 days.  She had a particularly bad episode just prior to her visit with associated nausea, indigestion, and palpitations; felt very weak.  We discussed consideration for an ablation, however patient preferred watchful waiting and if more severe episodes would consider at that time.  Atrial fibrillation history dates back to 2016 when she was diagnosed after presenting with shortness of breath and palpitations.  Echo at that time showed EF 25 to 30%.  Nuclear stress test at that time showed no ischemia.  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  She underwent TEE/DCCV with successful restoration of NSR, however had recurrent A. fib within 1 month.  She was started on amiodarone at that time.  She saw Dr. Johney Frame with electrophysiology in 2020 for consideration of an ablation though did not go forward with scheduled procedure. She had subsequent recovery in her EF with sinus rhythm. Latest CT scan showed bibasilar scarring. She was later seen by Dr Johney Frame with recurrent Afib despite amiodarone. She had ablation 01/16/21. Amiodarone was later discontinued.   In February 2022 was seen in Afib clinic with increased Afib burden. She was scheduled for cardioversion but converted on her own. She did have a sleep study showing mild OSA.   She reports she tried  CPAP but hasn't been able to tolerate the mask.   She was seen  by Dr Lalla Brothers for her recurrent Afib. Not felt to be a good candidate for Tikosyn due to CKD. Discussed trying amiodarone versus repeat ablation.  She underwent afib ablation 06/03/22. Unfortunately, she was back in afib 2 days post ablation, confirmed on her [name] mobile device. She states she felt great for 1-2 days post procedure. She remained in Afib when seen in clinic on March 12. She was seen 09/11/22 by Dr. Lalla Brothers and planned for cardioversion. She underwent DCCV with 200j on 09/16/22 with thought that if she went back into AF, she would be considered permanent atrial fibrillation and transition focus to rate control. She converted to SR but noted 6/10 she had returned to AF. She was seen in the AF Clinic 6/12, at that time discussion was related to initiating amiodarone and re-trial of DCCV. She has maintained on Xarelto, lopressor and follows her rhythm with [name]. She noted feeling much better in NSR so decision was made to reload with oral amiodarone and repeat DCCV planned for August 18. Amiodarone was reloaded on July 16. When I saw her she was back in NSR and DCCV was canceled.  She did undergo esophageal dilation in October. She states this helped with her swallowing. She states she did have a bad HA at work last week and this is often associated with high BP but she hasn't taken BP recently. Denies any palpitations.    Past Medical History:  Diagnosis Date   Chronic systolic CHF (congestive heart failure) (HCC)    a. 2D ECHO on 02/10/15 w/ severe LV dysfunction with EF 25-30%. Mild MR and mild LAE. negative nuclear stress test, felt to be due to tachycardia    Dementia (HCC)    Dyspnea    with exertion   Dysrhythmia    PAF   Esophageal stricture    a. requiring dilations. crushes pills to take PO   Full dentures    GERD (gastroesophageal reflux disease)    Hypertension    Osteoporosis    sees Dr. Ardyth Harps     PAF (paroxysmal atrial fibrillation) [facility])    a. s/p succesfful DCCV on 02/13/15   Pneumonia    (08/15/16)- many years ago   Sebaceous cyst    on back of neck, has seen Dr. Bertram Savin   Sleep apnea    does not use CPAP    Past Surgical History:  Procedure Laterality Date   ATRIAL FIBRILLATION ABLATION N/A 01/16/2021   Procedure: ATRIAL FIBRILLATION ABLATION;  Surgeon: Hillis Range, MD;  Location: [facility];  Service: Cardiovascular;  Laterality: N/A;   ATRIAL FIBRILLATION ABLATION N/A 06/03/2022   Procedure: ATRIAL FIBRILLATION ABLATION;  Surgeon: Lanier Prude, MD;  Location: [facility];  Service: Cardiovascular;  Laterality: N/A;   BALLOON DILATION N/A 06/22/2018   Procedure: ESOPHAGEAL BALLOON DILATION;  Surgeon: Christia Reading, MD;  Location: [facility];  Service: ENT;  Laterality: N/A;   BALLOON DILATION N/A 02/10/2023   Procedure: [place];  Surgeon: Christia Reading, MD;  Location: [facility] OR;  Service: ENT;  Laterality: N/A;   BREAST SURGERY Bilateral    Breast Implants   CARDIOVERSION N/A 02/13/2015   Procedure: CARDIOVERSION;  Surgeon: Chrystie Nose, MD;  Location: [facility];  Service: Cardiovascular;  Laterality: N/A;   CARDIOVERSION N/A 09/16/2022   Procedure: CARDIOVERSION;  Surgeon: Christell Constant, MD;  Location: [facility];  Service: Cardiovascular;  Laterality: N/A;   COLONOSCOPY  09/24/2002   ESOPHAGEAL DILATION N/A 08/19/2016   Procedure: ESOPHAGEAL DILATION;  Surgeon: Christia Reading, MD;  Location: [facility] OR;  Service: ENT;  Laterality: N/A;  esophagoscopy with balloon dilation   ESOPHAGOGASTRODUODENOSCOPY  06/01/2003   12/13   ESOPHAGOGASTRODUODENOSCOPY (EGD) WITH ESOPHAGEAL DILATION     ESOPHAGOSCOPY N/A 02/10/2023   Procedure: RIGID ESOPHAGOSCOPY;  Surgeon: Christia Reading, MD;  Location: [facility] OR;  Service: ENT;  Laterality: N/A;   ESOPHAGOSCOPY WITH DILITATION N/A 06/16/2017   Procedure: ESOPHAGOSCOPY WITH BALLOON  DILITATION;  Surgeon: Christia Reading, MD;  Location: [facility];  Service: ENT;  Laterality: N/A;   RIGID ESOPHAGOSCOPY N/A 01/18/2013   Procedure: RIGID ESOPHAGOSCOPY WITH ESPHAGEAL Balloon DILATION ;  Surgeon: Christia Reading, MD;  Location: [facility];  Service: ENT;  Laterality: N/A;   RIGID ESOPHAGOSCOPY N/A 06/22/2018   Procedure: RIGID ESOPHAGOSCOPY;  Surgeon: Christia Reading, MD;  Location: [facility];  Service: ENT;  Laterality: N/A;   TEE WITHOUT CARDIOVERSION N/A 02/13/2015   Procedure: TRANSESOPHAGEAL ECHOCARDIOGRAM (TEE);  Surgeon: Chrystie Nose, MD;  Location: [facility];  Service: Cardiovascular;  Laterality: N/A;     Current Outpatient Medications  Medication Sig Dispense Refill   amiodarone (PACERONE) 200 MG tablet Take 1 tablet (200 mg total) by mouth daily. 90 tablet 3   chlorthalidone (HYGROTON) 25 MG tablet TAKE 1/2 TABLET BY MOUTH DAILY 45 tablet 3   lansoprazole (PREVACID) 30 MG capsule  TAKE 1 CAPSULE BY MOUTH EVERY DAY 90 capsule 3   losartan (COZAAR) 100 MG tablet TAKE 1 TABLET BY MOUTH EVERY DAY (Patient taking differently: Take 50 mg by mouth daily.) 90 tablet 3   metoprolol tartrate (LOPRESSOR) 25 MG tablet TAKE 1 TABLET BY MOUTH TWICE A DAY 180 tablet 1   XARELTO 15 MG TABS tablet TAKE 1 TABLET (15 MG TOTAL) BY MOUTH DAILY WITH SUPPER 90 tablet 1   No current facility-administered medications for this visit.    Allergies:   Ramipril    Social History:  The patient  reports that she has never smoked. She has never used smokeless tobacco. She reports that she does not drink alcohol and does not use drugs.   Family History:  The patient's family history includes Aneurysm in her father; Diabetes type II in her sister; Heart attack in her brother and brother; Heart attack (age of onset: 17) in her mother; Hypertension in her sister and sister.    ROS:  Please see the history of present illness.   Otherwise, review of  systems are positive for none.   All other systems are reviewed and negative.    PHYSICAL EXAM: VS:  BP (!) 200/94 (BP Location: Left Arm, Patient Position: Sitting, Cuff Size: Normal)   Pulse (!) 59   Ht 5' (1.524 m)   Wt 103 lb 3.2 oz (46.8 kg)   SpO2 99%   BMI 20.15 kg/m  , BMI Body mass index is 20.15 kg/m. GEN: Well nourished, well developed, in no acute distress HEENT: sclera anicteric Neck: no JVD, carotid bruits, or masses Cardiac: IRRR; no murmurs, rubs, or gallops, tr edema  Respiratory:  clear to auscultation bilaterally, normal work of breathing GI: soft, nontender, nondistended, + BS MS: no deformity or atrophy Skin: warm and dry, no rash Neuro:  Strength and sensation are intact Psych: euthymic mood, full affect     Recent Labs: 06/26/2022: ALT 12; TSH 2.53 02/10/2023: BUN 15; Creatinine, Ser 1.15; Hemoglobin 12.7; Platelets 219; Potassium 3.8; Sodium 137    Lipid Panel    Component Value Date/Time   CHOL 183 06/26/2022 0843   CHOL 205 (H) 02/22/2019 1117   TRIG 98.0 06/26/2022 0843   HDL 57.40 06/26/2022 0843   HDL 77 02/22/2019 1117   CHOLHDL 3 06/26/2022 0843   VLDL 19.6 06/26/2022 0843   LDLCALC 106 (H) 06/26/2022 0843   LDLCALC 112 (H) 02/22/2019 1117   LDLDIRECT 121.0 03/14/2010 0931      Wt Readings from Last 3 Encounters:  05/28/23 103 lb 3.2 oz (46.8 kg)  02/10/23 98 lb (44.5 kg)  11/12/22 98 lb 6.4 oz (44.6 kg)      Other studies Reviewed: Additional studies/ records that were reviewed today include:   Echocardiogram 2019: Study Conclusions   - Left ventricle: The cavity size was normal. Wall thickness was    normal. Systolic function was normal. The estimated ejection    fraction was in the range of 55% to 60%. Wall motion was normal;    there were no regional wall motion abnormalities. Doppler    parameters are consistent with abnormal left ventricular    relaxation (grade 2 diastolic dysfunction). The E/e&' ratio is    >15,  suggesting elevated LV filling pressure.  - Mitral valve: Mildly thickened leaflets . There was trivial    regurgitation.  - Left atrium: The atrium was normal in size.  - Tricuspid valve: There was mild regurgitation.  - Pulmonary arteries:  PA peak pressure: 34 mm Hg (S).  - Inferior vena cava: The vessel was normal in size. The    respirophasic diameter changes were in the normal range (>= 50%),    consistent with normal central venous pressure.   Impressions:   - Compared to a prior study in 2017, there are no significant    changes.   NST 2016: 1. No reversible ischemia or infarction. Small, mild fixed defect along the anterior apex.   2. Global hypokinesis.   3.  Decreased left ventricular ejection fraction of 21%.   4. High-risk stress test findings*.  Echo 10/27/20: IMPRESSIONS     1. Left ventricular ejection fraction, by estimation, is 60 to 65%. The  left ventricle has normal function. The left ventricle has no regional  wall motion abnormalities. There is mild asymmetric left ventricular  hypertrophy. Left ventricular diastolic  parameters are indeterminate. Elevated left ventricular end-diastolic  pressure.   2. Right ventricular systolic function is normal. The right ventricular  size is normal.   3. The mitral valve is normal in structure. Trivial mitral valve  regurgitation.   4. The aortic valve is normal in structure. Aortic valve regurgitation is  trivial.   Echo 4//17/24: IMPRESSIONS     1. Left ventricular ejection fraction, by estimation, is 50%. The left  ventricle has mildly decreased function. The left ventricle demonstrates  global hypokinesis. There is mild concentric left ventricular hypertrophy.  Left ventricular diastolic  parameters are indeterminate.   2. Right ventricular systolic function is mildly reduced. The right  ventricular size is normal. There is mildly elevated pulmonary artery  systolic pressure. The estimated right  ventricular systolic pressure is  38.5 mmHg.   3. Left atrial size was mildly dilated.   4. The mitral valve is normal in structure. Trivial mitral valve  regurgitation. No evidence of mitral stenosis.   5. The aortic valve is tricuspid. Aortic valve regurgitation is not  visualized. No aortic stenosis is present.   6. The inferior vena cava is dilated in size with >50% respiratory  variability, suggesting right atrial pressure of 8 mmHg.   7. The patient was in atrial fibrillation.   ASSESSMENT AND PLAN: Paroxysmal Afib- S/p Afib ablation in October 2022. - Recurrent Afib  -Continue Xarelto for stroke ppx.   - has been on metoprolol for rate control - will switch to Coreg for better BP control  - s/p repeat Ablation on Jun 03, 2022. Repeat DCCV in June but returned to Afib. Later confirmed return to NSR on higher amiodarone dose - continue amiodarone 200 mg daily  2.  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  - most recent Echo with EF 50%  - Continue chlorthalidone, losartan, and Coreg  3.  Labile hypertension:  - BP is quite high today - continue losartan, chlorthalidone - will switch metoprolol to Coreg 25 mg bid.  - requested she monitor her BP at home and let us know if > 140 - low sodium diet.   4.  Insomnia/daytime somnolence/snoring:  - OSA noted on sleep study - reports intolerance of CPAP   Disposition:   FU with me in 6 months  Signed, Mackenzie Haskew [name], MD  05/28/2023 8:54 AM

## 2023-05-28 NOTE — Patient Instructions (Signed)
Medication Instructions:  Stop Metoprolol Start Carvedilol 25 mg twice a day Continue all other medications *If you need a refill on your cardiac medications before your next appointment, please call your pharmacy*   Lab Work: None ordered   Testing/Procedures: None ordered   Follow-Up: At [facility], you and your health needs are our priority.  As part of our continuing mission to provide you with exceptional heart care, we have created designated Provider Care Teams.  These Care Teams include your primary Cardiologist (physician) and Advanced Practice Providers (APPs -  Physician Assistants and Nurse Practitioners) who all work together to provide you with the care you need, when you need it.  We recommend signing up for the patient portal called "MyChart".  Sign up information is provided on this After Visit Summary.  MyChart is used to connect with patients for Virtual Visits (Telemedicine).  Patients are able to view lab/test results, encounter notes, upcoming appointments, etc.  Non-urgent messages can be sent to your provider as well.   To learn more about what you can do with MyChart, go to [email or web address].    Your next appointment:  6 months   Call in April to schedule August appointment     Provider:  Dr.Jordan     Monitor blood pressure daily  Call if Systolic staying 161

## 2023-05-30 ENCOUNTER — Telehealth: Payer: Self-pay | Admitting: Cardiology

## 2023-05-30 NOTE — Telephone Encounter (Signed)
Spoke to patient Dr.Jordan's advice given.Advised to continue to monitor B/P and call back if B/P low.

## 2023-05-30 NOTE — Telephone Encounter (Signed)
Spoke to patient she stated she took her first dose of Coreg 25 mg this morning at 6:30 am.B/P 187/93 pulse 57.Stated at 8:30 am she noticed she was dizzy B/P 85/49 pulse 55.At present B/P 127/60 pulse 65.Stated she is concerned about taking pm dose.Advised I will send message to Dr.Jordan for advice.

## 2023-05-30 NOTE — Telephone Encounter (Signed)
Pt c/o BP issue: STAT if pt c/o blurred vision, one-sided weakness or slurred speech  1. What are your last 5 BP readings? 187/93, 86.59  2. Are you having any other symptoms (ex. Dizziness, headache, blurred vision, passed out)? dizziness  3. What is your BP issue? Pt's medication was changed at over yesterday  carvedilol (COREG) 25 MG tablet . This first BP Reading was when she got up this morning. Second one was after she took the medication. She felt so dizzy.

## 2023-06-11 ENCOUNTER — Ambulatory Visit: Payer: BC Managed Care – PPO | Admitting: Family Medicine

## 2023-06-21 ENCOUNTER — Other Ambulatory Visit: Payer: Self-pay | Admitting: Cardiology

## 2023-07-15 ENCOUNTER — Other Ambulatory Visit: Payer: Self-pay | Admitting: Family Medicine

## 2023-07-15 ENCOUNTER — Other Ambulatory Visit (HOSPITAL_COMMUNITY): Payer: Self-pay | Admitting: Cardiology

## 2023-07-15 DIAGNOSIS — K219 Gastro-esophageal reflux disease without esophagitis: Secondary | ICD-10-CM

## 2023-07-23 ENCOUNTER — Telehealth: Payer: Self-pay | Admitting: Cardiology

## 2023-07-23 ENCOUNTER — Telehealth: Payer: Self-pay

## 2023-07-23 NOTE — Telephone Encounter (Signed)
 Copied from CRM [id number]. Topic: Clinical - Medication Question >> Jul 23, 2023  2:27 PM Elizebeth Brooking wrote: Reason for CRM: Patient called in stating her insurance stated that they cover these two medications, patient would like to know which one Dr.Fry would approve for her to rake  Omeprazole Pantoprazole   [phone]

## 2023-07-23 NOTE — Telephone Encounter (Signed)
 Pt c/o medication issue:  1. Name of Medication:   carvedilol (COREG) 25 MG tablet   2. How are you currently taking this medication (dosage and times per day)?   As prescribed  3. Are you having a reaction (difficulty breathing--STAT)?   4. What is your medication issue?   Patient stated her BP has been going up and down with this medication and wants to know next steps.

## 2023-07-23 NOTE — Telephone Encounter (Signed)
 Attempted to call patient, no answer left message requesting a call back.

## 2023-07-23 NOTE — Telephone Encounter (Signed)
 Patient identification verified by 2 forms. Marilynn Rail, RN    Called and spoke to patient  Patient states:   -she is having BP concerns   -BP going up and down   -BP yesterday was: 93/52 (after taking carvedilol, amiodarone and losartan)   -BP 4/2 160/89 at 8:30 Am after taking medications   -BP 4/9 at 11:00am 103/63  -BP 4/9 at 1:15 104/56  -BP 4/9 at 4:00pm 99/60   -when BP is low she feels like she can't function Patient denies:   -lightheadedness/dizziness   -LOC/falls Informed patient message sent to Dr. [name] for input/advisement  Reviewed ED warning signs/precautions  Patient verbalized understanding, no questions at this time

## 2023-07-23 NOTE — Telephone Encounter (Signed)
 Spoke to patient Dr.Jordan's advice given.She will hold Chlorthalidone.Advised to continue to monitor B/P and call back if no improvement.

## 2023-07-24 ENCOUNTER — Other Ambulatory Visit: Payer: Self-pay

## 2023-07-24 MED ORDER — PANTOPRAZOLE SODIUM 40 MG PO TBEC: 40.0000 mg | DELAYED_RELEASE_TABLET | Freq: Every day | ORAL | 3 refills | Status: DC

## 2023-07-24 NOTE — Telephone Encounter (Signed)
 New Rx sent and pt notified

## 2023-07-24 NOTE — Telephone Encounter (Signed)
 I would prefer her take the Pantoprazole. Please call in 40 mg daily, #90 with 3 rf for this if needed

## 2023-07-26 ENCOUNTER — Other Ambulatory Visit: Payer: Self-pay | Admitting: Cardiology

## 2023-07-28 ENCOUNTER — Telehealth: Payer: Self-pay | Admitting: Cardiology

## 2023-07-28 ENCOUNTER — Telehealth: Payer: Self-pay | Admitting: Family Medicine

## 2023-07-28 MED ORDER — OMEPRAZOLE 40 MG PO CPDR: 40.0000 mg | DELAYED_RELEASE_CAPSULE | Freq: Every day | ORAL | 3 refills | Status: AC

## 2023-07-28 NOTE — Telephone Encounter (Signed)
 STAT if HR is under 50 or over 120 (normal HR is 60-100 beats per minute)  What is your heart rate?  108 at 1:30 pm 104 at 12:00 pm  Do you have a log of your heart rate readings (document readings)?   Yes  Do you have any other symptoms?   Low grade fever yesterday, slight headache, feeling tired

## 2023-07-28 NOTE — Telephone Encounter (Signed)
 I sent in Omeprazole capsules

## 2023-07-28 NOTE — Telephone Encounter (Signed)
 Spoke with Mackenzie [name], she reports that she has noticed her heart rate is running high and her kardia monitor does not show atrial fib. She notices it during the day and at night when she gets up to the bathroom her heart will race. She is staying well hydrated. When reviewing medications, she reports her carvedilol was stopped 07/24/23 when she had called about her blood pressure. According to the notes in her chart, she was told to hold the chlorthalidone not the carvedilol. She reports she had stopped the wrong medication. Medication discussed in detail with the patient. She will restart the carvedilol and continue to monitor. She will call if symptoms do not change.

## 2023-07-28 NOTE — Telephone Encounter (Unsigned)
 Copied from CRM [id number]. Topic: Clinical - Medical Advice >> Jul 28, 2023  9:28 AM Jyl Or S wrote: Reason for CRM: Patient unable to swallow pill patient wanted to know if she can get capsule instead  pantoprazole (PROTONIX) 40 MG tablet [[id number]]

## 2023-07-29 NOTE — Telephone Encounter (Signed)
 Pt notified of new Rx for Omeprazole

## 2023-08-12 DIAGNOSIS — Z961 Presence of intraocular lens: Secondary | ICD-10-CM | POA: Diagnosis not present

## 2023-08-12 DIAGNOSIS — H52203 Unspecified astigmatism, bilateral: Secondary | ICD-10-CM | POA: Diagnosis not present

## 2023-08-19 ENCOUNTER — Telehealth: Payer: Self-pay | Admitting: Cardiology

## 2023-08-19 NOTE — Telephone Encounter (Signed)
 Pt c/o medication issue:  1. Name of Medication:   carvedilol  (COREG ) 25 MG tablet    2. How are you currently taking this medication (dosage and times per day)? Take 1/2 tablet ( 12.5 mg ) twice a day   3. Are you having a reaction (difficulty breathing--STAT)? No  4. What is your medication issue? Pt states that for the last 2-3 weeks she has been coughing to the oint that it gets hard for her to breathe. Pt seems to think that it may be the above medication. Please advise

## 2023-08-19 NOTE — Telephone Encounter (Signed)
 Called patient about her message. Patient stated that she has been having a cough in the morning and then at night after she takes her coreg . Patient stated the cough sometimes makes her feel like she cannot catch her breath. Patient stated her BP and HR are fine. Patient stated she also has swelling in her ankles daily and it goes down at night. Will send message to Dr. [name] and his nurse.

## 2023-08-20 MED ORDER — CARVEDILOL 6.25 MG PO TABS: 6.2500 mg | ORAL_TABLET | Freq: Two times a day (BID) | ORAL | 3 refills | Status: DC

## 2023-08-20 NOTE — Telephone Encounter (Signed)
 [name], Peter M, MD to [facility] Triage  Honesty, Schwallie, LPN     04/20/08 96:04 PM Let's try reducing Coreg  to 6.25 mg bid and see if symptoms improve. If not let us  know.  Peter [name] MD, [facility]  Advised patient, verbalized understanding

## 2023-08-25 ENCOUNTER — Ambulatory Visit: Admitting: Family Medicine

## 2023-08-26 ENCOUNTER — Ambulatory Visit: Admitting: Family Medicine

## 2023-10-15 NOTE — Progress Notes (Signed)
 [facility] Quality Team Note  Name: Mackenzie [name] Date of Birth: 03/29/1951 MRN: [id number] Date: 10/15/2023  [facility] Quality Team has reviewed this patient's chart, please see recommendations below:  [facility] Quality Other; (CHART REVIEWED. PATIENT LAST BLOOD PRESSURE OUT OF RANGE. NEED BLOOD PRESSURE LESS THAN 140/90)

## 2023-11-13 ENCOUNTER — Other Ambulatory Visit: Payer: Self-pay | Admitting: Cardiology

## 2023-11-13 DIAGNOSIS — I4819 Other persistent atrial fibrillation: Secondary | ICD-10-CM

## 2023-11-17 NOTE — Telephone Encounter (Signed)
 Prescription refill request for Xarelto  received.  Indication:afib Last office visit:2/25 Weight:46.8  kg Age:73 Scr:1.15  10/24 CrCl:31.71  ml/min  Prescription refilled

## 2023-11-20 NOTE — Progress Notes (Signed)
 Cardiology Office Note   Date:  11/20/2023   ID:  Dia, Donate [birth year], MRN [id number]  PCP:  Johnny Garnette LABOR, MD  Cardiologist:  Sakina Briones [name], MD EP: OLE ONEIDA HOLTS, MD  No chief complaint on file.      History of Present Illness: Mackenzie [name] is a 73 y.o. 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  She was evaluated by me on 02/24/2020 at which time she was doing okay from a cardiology standpoint.  She reported episodes of atrial fibrillation approximately once a month which would last for up to 3 days.  She had a particularly bad episode just prior to her visit with associated nausea, indigestion, and palpitations; felt very weak.  We discussed consideration for an ablation, however patient preferred watchful waiting and if more severe episodes would consider at that time.  Atrial fibrillation history dates back to 2016 when she was diagnosed after presenting with shortness of breath and palpitations.  Echo at that time showed EF 25 to 30%.  Nuclear stress test at that time showed no ischemia.  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  She underwent TEE/DCCV with successful restoration of NSR, however had recurrent A. fib within 1 month.  She was started on amiodarone  at that time.  She saw Dr. Kelsie with electrophysiology in 2020 for consideration of an ablation though did not go forward with scheduled procedure. She had subsequent recovery in her EF with sinus rhythm. Latest CT scan showed bibasilar scarring. She was later seen by Dr Kelsie with recurrent Afib despite amiodarone . She had ablation 01/16/21. Amiodarone  was later discontinued.   In February 2022 was seen in Afib clinic with increased Afib burden. She was scheduled for cardioversion but converted on her own. She did have a sleep study showing mild OSA.   She reports she tried  CPAP but hasn't been able to tolerate the mask.   She was seen  by Dr HOLTS for her recurrent Afib. Not felt to be a good candidate for Tikosyn due to CKD. Discussed trying amiodarone  versus repeat ablation.  She underwent afib ablation 06/03/22. Unfortunately, she was back in afib 2 days post ablation, confirmed on her Crist mobile device. She states she felt great for 1-2 days post procedure. She remained in Afib when seen in clinic on March 12. She was seen 09/11/22 by Dr. HOLTS and planned for cardioversion. She underwent DCCV with 200j on 09/16/22 with thought that if she went back into AF, she would be considered permanent atrial fibrillation and transition focus to rate control. She converted to SR but noted 6/10 she had returned to AF. She was seen in the AF Clinic 6/12, at that time discussion was related to initiating amiodarone  and re-trial of DCCV. She has maintained on Xarelto , lopressor  and follows her rhythm with [name]. She noted feeling much better in NSR so decision was made to reload with oral amiodarone  and repeat DCCV planned for August 18. Amiodarone  was reloaded on July 16. When I saw her she was back in NSR and DCCV was canceled.  She did undergo esophageal dilation in October. She states this helped with her swallowing. She states she did have a bad HA at work last week and this is often associated with high BP but she hasn't taken BP recently. Denies any palpitations.    Past Medical History:  Diagnosis Date   Chronic systolic CHF (congestive heart failure) (HCC)    a. 2D ECHO on 02/10/15 w/ severe LV dysfunction with EF 25-30%. Mild MR and mild LAE. negative nuclear stress test, felt to be due to tachycardia    Dementia (HCC)    Dyspnea    with exertion   Dysrhythmia    PAF   Esophageal stricture    a. requiring dilations. crushes pills to take PO   Full dentures    GERD (gastroesophageal reflux disease)    Hypertension    Osteoporosis    sees Dr. Marcey Ore     PAF (paroxysmal atrial fibrillation) [facility])    a. s/p succesfful DCCV on 02/13/15   Pneumonia    (08/15/16)- many years ago   Sebaceous cyst    on back of neck, has seen Dr. Jeoffrey Dawn   Sleep apnea    does not use CPAP    Past Surgical History:  Procedure Laterality Date   ATRIAL FIBRILLATION ABLATION N/A 01/16/2021   Procedure: ATRIAL FIBRILLATION ABLATION;  Surgeon: Kelsie Agent, MD;  Location: [facility];  Service: Cardiovascular;  Laterality: N/A;   ATRIAL FIBRILLATION ABLATION N/A 06/03/2022   Procedure: ATRIAL FIBRILLATION ABLATION;  Surgeon: Cindie Ole DASEN, MD;  Location: [facility];  Service: Cardiovascular;  Laterality: N/A;   BALLOON DILATION N/A 06/22/2018   Procedure: ESOPHAGEAL BALLOON DILATION;  Surgeon: Carlie Clark, MD;  Location: [facility];  Service: ENT;  Laterality: N/A;   BALLOON DILATION N/A 02/10/2023   Procedure: MERRILL HODGKIN;  Surgeon: Carlie Clark, MD;  Location: [facility] OR;  Service: ENT;  Laterality: N/A;   BREAST SURGERY Bilateral    Breast Implants   CARDIOVERSION N/A 02/13/2015   Procedure: CARDIOVERSION;  Surgeon: Vinie JAYSON Maxcy, MD;  Location: [facility] ENDOSCOPY;  Service: Cardiovascular;  Laterality: N/A;   CARDIOVERSION N/A 09/16/2022   Procedure: CARDIOVERSION;  Surgeon: Santo Stanly LABOR, MD;  Location: [facility];  Service: Cardiovascular;  Laterality: N/A;   COLONOSCOPY  09/24/2002   ESOPHAGEAL DILATION N/A 08/19/2016   Procedure: ESOPHAGEAL DILATION;  Surgeon: Carlie Clark, MD;  Location: [facility] OR;  Service: ENT;  Laterality: N/A;  esophagoscopy with balloon dilation   ESOPHAGOGASTRODUODENOSCOPY  06/01/2003   12/13   ESOPHAGOGASTRODUODENOSCOPY (EGD) WITH ESOPHAGEAL DILATION     ESOPHAGOSCOPY N/A 02/10/2023   Procedure: RIGID ESOPHAGOSCOPY;  Surgeon: Carlie Clark, MD;  Location[facility] OR;  Service: ENT;  Laterality: N/A;   ESOPHAGOSCOPY WITH DILITATION N/A 06/16/2017   Procedure: ESOPHAGOSCOPY WITH BALLOON  DILITATION;  Surgeon: Carlie Clark, MD;  Location: [facility];  Service: ENT;  Laterality: N/A;   RIGID ESOPHAGOSCOPY N/A 01/18/2013   Procedure: RIGID ESOPHAGOSCOPY WITH ESPHAGEAL Balloon DILATION ;  Surgeon: Clark Carlie, MD;  Location: [facility];  Service: ENT;  Laterality: N/A;   RIGID ESOPHAGOSCOPY N/A 06/22/2018   Procedure: RIGID ESOPHAGOSCOPY;  Surgeon: Carlie Clark, MD;  Location: [facility];  Service: ENT;  Laterality: N/A;   TEE WITHOUT CARDIOVERSION N/A 02/13/2015   Procedure: TRANSESOPHAGEAL ECHOCARDIOGRAM (TEE);  Surgeon: Vinie JAYSON Maxcy, MD;  Location: [facility] ENDOSCOPY;  Service: Cardiovascular;  Laterality: N/A;     Current Outpatient Medications  Medication Sig Dispense Refill   amiodarone  (PACERONE ) 200 MG tablet TAKE 1 TABLET BY MOUTH EVERY DAY 90 tablet 1   carvedilol  (COREG ) 6.25 MG tablet TAKE 1 TABLET BY MOUTH 2 TIMES DAILY WITH A MEAL. 180 tablet 1   lansoprazole  (PREVACID )  30 MG capsule TAKE 1 CAPSULE BY MOUTH EVERY DAY 90 capsule 3   losartan  (COZAAR ) 100 MG tablet TAKE 1/2 TABLET BY MOUTH DAILY 45 tablet 3   omeprazole  (PRILOSEC) 40 MG capsule Take 1 capsule (40 mg total) by mouth daily. 90 capsule 3   XARELTO  15 MG TABS tablet TAKE 1 TABLET (15 MG TOTAL) BY MOUTH DAILY WITH SUPPER 90 tablet 1   No current facility-administered medications for this visit.    Allergies:   Ramipril     Social History:  The patient  reports that she has never smoked. She has never used smokeless tobacco. She reports that she does not drink alcohol and does not use drugs.   Family History:  The patient's family history includes Aneurysm in her father; Diabetes type II in her sister; Heart attack in her brother and brother; Heart attack (age of onset: 57) in her mother; Hypertension in her sister and sister.    ROS:  Please see the history of present illness.   Otherwise, review of systems are positive for none.   All other systems are  reviewed and negative.    PHYSICAL EXAM: VS:  There were no vitals taken for this visit. , BMI There is no height or weight on file to calculate BMI. GEN: Well nourished, well developed, in no acute distress HEENT: sclera anicteric Neck: no JVD, carotid bruits, or masses Cardiac: IRRR; no murmurs, rubs, or gallops, tr edema  Respiratory:  clear to auscultation bilaterally, normal work of breathing GI: soft, nontender, nondistended, + BS MS: no deformity or atrophy Skin: warm and dry, no rash Neuro:  Strength and sensation are intact Psych: euthymic mood, full affect     Recent Labs: 02/10/2023: BUN 15; Creatinine, Ser 1.15; Hemoglobin 12.7; Platelets 219; Potassium 3.8; Sodium 137    Lipid Panel    Component Value Date/Time   CHOL 183 06/26/2022 0843   CHOL 205 (H) 02/22/2019 1117   TRIG 98.0 06/26/2022 0843   HDL 57.40 06/26/2022 0843   HDL 77 02/22/2019 1117   CHOLHDL 3 06/26/2022 0843   VLDL 19.6 06/26/2022 0843   LDLCALC 106 (H) 06/26/2022 0843   LDLCALC 112 (H) 02/22/2019 1117   LDLDIRECT 121.0 03/14/2010 0931      Wt Readings from Last 3 Encounters:  05/28/23 103 lb 3.2 oz (46.8 kg)  02/10/23 98 lb (44.5 kg)  11/12/22 98 lb 6.4 oz (44.6 kg)     EKG Interpretation Date/Time:  Tuesday November 25 2023 09:28:54 EDT Ventricular Rate:  57 PR Interval:  228 QRS Duration:  108 QT Interval:  456 QTC Calculation: 443 R Axis:   -35  Text Interpretation: Sinus bradycardia with 1st degree A-V block Left axis deviation Left ventricular hypertrophy with repolarization abnormality ( R in aVL , Cornell product , Romhilt-Estes ) When compared with ECG of 12-Nov-2022 10:24, T wave inversion no longer evident in Inferior leads T wave amplitude has increased in Anterior leads Confirmed by [name], Ainhoa Rallo [phone]) on 11/25/2023 9:40:09 AM   Other studies Reviewed: Additional studies/ records that were reviewed today include:   Echocardiogram 2019: Study Conclusions   - Left  ventricle: The cavity size was normal. Wall thickness was    normal. Systolic function was normal. The estimated ejection    fraction was in the range of 55% to 60%. Wall motion was normal;    there were no regional wall motion abnormalities. Doppler    parameters are consistent with abnormal left ventricular  relaxation (grade 2 diastolic dysfunction). The E/e&' ratio is    >15, suggesting elevated LV filling pressure.  - Mitral valve: Mildly thickened leaflets . There was trivial    regurgitation.  - Left atrium: The atrium was normal in size.  - Tricuspid valve: There was mild regurgitation.  - Pulmonary arteries: PA peak pressure: 34 mm Hg (S).  - Inferior vena cava: The vessel was normal in size. The    respirophasic diameter changes were in the normal range (>= 50%),    consistent with normal central venous pressure.   Impressions:   - Compared to a prior study in 2017, there are no significant    changes.   NST 2016: 1. No reversible ischemia or infarction. Small, mild fixed defect along the anterior apex.   2. Global hypokinesis.   3.  Decreased left ventricular ejection fraction of 21%.   4. High-risk stress test findings*.  Echo 10/27/20: IMPRESSIONS     1. Left ventricular ejection fraction, by estimation, is 60 to 65%. The  left ventricle has normal function. The left ventricle has no regional  wall motion abnormalities. There is mild asymmetric left ventricular  hypertrophy. Left ventricular diastolic  parameters are indeterminate. Elevated left ventricular end-diastolic  pressure.   2. Right ventricular systolic function is normal. The right ventricular  size is normal.   3. The mitral valve is normal in structure. Trivial mitral valve  regurgitation.   4. The aortic valve is normal in structure. Aortic valve regurgitation is  trivial.   Echo 4//17/24: IMPRESSIONS     1. Left ventricular ejection fraction, by estimation, is 50%. The left  ventricle  has mildly decreased function. The left ventricle demonstrates  global hypokinesis. There is mild concentric left ventricular hypertrophy.  Left ventricular diastolic  parameters are indeterminate.   2. Right ventricular systolic function is mildly reduced. The right  ventricular size is normal. There is mildly elevated pulmonary artery  systolic pressure. The estimated right ventricular systolic pressure is  38.5 mmHg.   3. Left atrial size was mildly dilated.   4. The mitral valve is normal in structure. Trivial mitral valve  regurgitation. No evidence of mitral stenosis.   5. The aortic valve is tricuspid. Aortic valve regurgitation is not  visualized. No aortic stenosis is present.   6. The inferior vena cava is dilated in size with >50% respiratory  variability, suggesting right atrial pressure of 8 mmHg.   7. The patient was in atrial fibrillation.   ASSESSMENT AND PLAN: Paroxysmal Afib- S/p Afib ablation in October 2022. - Recurrent Afib  -Continue Xarelto  for stroke ppx.   - on Coreg  for rate control - s/p repeat Ablation on Jun 03, 2022. Repeat DCCV in June but returned to Afib. Later confirmed return to NSR on higher amiodarone  dose - continue amiodarone  200 mg daily - will check labs today  2.  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  - most recent Echo with EF 50% in April 2024  - Continue losartan , and Coreg   3.  Labile hypertension:  - BP is quite high today - will increase losartan  to 100 mg daily - continue  Coreg  6.25 mg bid.  - previously became hypotensive on chlorthalidone  - if BP still running high would add amlodipine  2.5 mg daily - low sodium diet.   4.  Insomnia/daytime somnolence/snoring:  - OSA noted on sleep study -  reports intolerance of CPAP   Disposition:   FU with me in 6 months  Signed, Karli Wickizer [name], MD  11/20/2023 1:13 PM

## 2023-11-25 ENCOUNTER — Ambulatory Visit: Attending: Cardiology | Admitting: Cardiology

## 2023-11-25 VITALS — BP 205/92 | HR 58 | Ht 61.0 in | Wt 101.0 lb

## 2023-11-25 DIAGNOSIS — I4891 Unspecified atrial fibrillation: Secondary | ICD-10-CM

## 2023-11-25 DIAGNOSIS — R0989 Other specified symptoms and signs involving the circulatory and respiratory systems: Secondary | ICD-10-CM

## 2023-11-25 DIAGNOSIS — I5022 Chronic systolic (congestive) heart failure: Secondary | ICD-10-CM | POA: Diagnosis not present

## 2023-11-25 DIAGNOSIS — I480 Paroxysmal atrial fibrillation: Secondary | ICD-10-CM

## 2023-11-25 MED ORDER — LOSARTAN POTASSIUM 100 MG PO TABS: 100.0000 mg | ORAL_TABLET | Freq: Every day | ORAL | 3 refills | Status: AC

## 2023-11-25 NOTE — Patient Instructions (Signed)
 Medication Instructions:  Increase Losartan  to 100 mg daily Continue all other medications *If you need a refill on your cardiac medications before your next appointment, please call your pharmacy*  Lab Work: Cbc,cmet,lipid panel,tsh today  Testing/Procedures: None ordered  Follow-Up: At [facility], you and your health needs are our priority.  As part of our continuing mission to provide you with exceptional heart care, our providers are all part of one team.  This team includes your primary Cardiologist (physician) and Advanced Practice Providers or APPs (Physician Assistants and Nurse Practitioners) who all work together to provide you with the care you need, when you need it.  Your next appointment:  6 months  Call in Oct to schedule Feb appointment     Provider:  Dr.Jordan      Monitor blood pressure daily Keep a diary Call if blood pressure continues to be elevated     We recommend signing up for the patient portal called MyChart.  Sign up information is provided on this After Visit Summary.  MyChart is used to connect with patients for Virtual Visits (Telemedicine).  Patients are able to view lab/test results, encounter notes, upcoming appointments, etc.  Non-urgent messages can be sent to your provider as well.   To learn more about what you can do with MyChart, go to [email or web address].

## 2023-11-26 ENCOUNTER — Ambulatory Visit: Payer: Self-pay | Admitting: Cardiology

## 2023-11-26 LAB — COMPREHENSIVE METABOLIC PANEL WITH GFR
ALT: 17 IU/L (ref 0–32)
AST: 31 IU/L (ref 0–40)
Albumin: 3.9 g/dL (ref 3.8–4.8)
Alkaline Phosphatase: 47 IU/L (ref 44–121)
BUN/Creatinine Ratio: 15 (ref 12–28)
BUN: 17 mg/dL (ref 8–27)
Bilirubin Total: 0.4 mg/dL (ref 0.0–1.2)
CO2: 19 mmol/L — ABNORMAL LOW (ref 20–29)
Calcium: 9.3 mg/dL (ref 8.7–10.3)
Chloride: 102 mmol/L (ref 96–106)
Creatinine, Ser: 1.13 mg/dL — ABNORMAL HIGH (ref 0.57–1.00)
Globulin, Total: 3 g/dL (ref 1.5–4.5)
Glucose: 89 mg/dL (ref 70–99)
Potassium: 4.5 mmol/L (ref 3.5–5.2)
Sodium: 138 mmol/L (ref 134–144)
Total Protein: 6.9 g/dL (ref 6.0–8.5)
eGFR: 51 mL/min/1.73 — ABNORMAL LOW (ref >59)

## 2023-11-26 LAB — CBC WITH DIFFERENTIAL/PLATELET
Basophils Absolute: 0 x10E3/uL (ref 0.0–0.2)
Basos: 1 %
EOS (ABSOLUTE): 0.1 x10E3/uL (ref 0.0–0.4)
Eos: 1 %
Hematocrit: 37.4 % (ref 34.0–46.6)
Hemoglobin: 11.5 g/dL (ref 11.1–15.9)
Immature Grans (Abs): 0 x10E3/uL (ref 0.0–0.1)
Immature Granulocytes: 0 %
Lymphocytes Absolute: 1.3 x10E3/uL (ref 0.7–3.1)
Lymphs: 21 %
MCH: 27.6 pg (ref 26.6–33.0)
MCHC: 30.7 g/dL — ABNORMAL LOW (ref 31.5–35.7)
MCV: 90 fL (ref 79–97)
Monocytes Absolute: 0.5 x10E3/uL (ref 0.1–0.9)
Monocytes: 9 %
Neutrophils Absolute: 4.1 x10E3/uL (ref 1.4–7.0)
Neutrophils: 68 %
Platelets: 260 x10E3/uL (ref 150–450)
RBC: 4.16 x10E6/uL (ref 3.77–5.28)
RDW: 13.2 % (ref 11.7–15.4)
WBC: 6 x10E3/uL (ref 3.4–10.8)

## 2023-11-26 LAB — LIPID PANEL
Chol/HDL Ratio: 2.8 ratio (ref 0.0–4.4)
Cholesterol, Total: 206 mg/dL — ABNORMAL HIGH (ref 100–199)
HDL: 74 mg/dL (ref >39)
LDL Chol Calc (NIH): 115 mg/dL — ABNORMAL HIGH (ref 0–99)
Triglycerides: 98 mg/dL (ref 0–149)
VLDL Cholesterol Cal: 17 mg/dL (ref 5–40)

## 2023-11-26 LAB — TSH: TSH: 3.14 u[IU]/mL (ref 0.450–4.500)

## 2023-12-02 ENCOUNTER — Telehealth: Payer: Self-pay | Admitting: Cardiology

## 2023-12-02 NOTE — Telephone Encounter (Signed)
 Spoke to patient she stated her B/P has been up and down.Readings listed below.At present 149/76 pulse 64.Stated she has not felt good weak,no energy,headache.Dr.Jordan is out of office this week.I will send message to him for advice.

## 2023-12-02 NOTE — Telephone Encounter (Signed)
 Pt c/o BP issue: STAT if pt c/o blurred vision, one-sided weakness or slurred speech.  STAT if BP is GREATER than 180/120 TODAY.  STAT if BP is LESS than 90/60 and SYMPTOMATIC TODAY  1. What is your BP concern? Elevated BP after med change to losartan   2. Have you taken any BP medication today?yes  3. What are your last 5 BP readings? 12/01/23: 100/55 12/02/23: 180/85 HR 86 (showed afib) 12/02/23 9:00AM: 180/112 HR 84 (showed afib) 12/02/23 10:30AM: 166/83 HR 66 (no afib)  4. Are you having any other symptoms (ex. Dizziness, headache, blurred vision, passed out)? headache   Patient c/o Palpitations:  STAT if patient reporting lightheadedness, shortness of breath, or chest pain  How long have you had palpitations/irregular HR/ Afib? Are you having the symptoms now? no  Are you currently experiencing lightheadedness, SOB or CP? no  Do you have a history of afib (atrial fibrillation) or irregular heart rhythm? yes  Have you checked your BP or HR? (document readings if available): yes, readings above  Are you experiencing any other symptoms? Headaches, tired and high BP

## 2023-12-03 MED ORDER — AMLODIPINE BESYLATE 2.5 MG PO TABS: 2.5000 mg | ORAL_TABLET | Freq: Every day | ORAL | 3 refills | Status: DC

## 2023-12-03 NOTE — Telephone Encounter (Signed)
 Spoke to patient Dr.Jordan's advice given.She will start Amlodipine  2.5 mg daily.She will continue to monitor B/P daily and call back in 2 weeks to report B/P readings.

## 2023-12-08 ENCOUNTER — Telehealth: Payer: Self-pay | Admitting: Cardiology

## 2023-12-08 DIAGNOSIS — I10 Essential (primary) hypertension: Secondary | ICD-10-CM

## 2023-12-08 MED ORDER — SPIRONOLACTONE 25 MG PO TABS: ORAL_TABLET | ORAL | 3 refills | Status: DC

## 2023-12-08 NOTE — Telephone Encounter (Signed)
 Spoke to patient Dr.Jordan's advice given.She will stop Amlodipine  and start Aldactone  12.5 mg daily.Bmet to be done in 1 week.Lab order mailed.

## 2023-12-08 NOTE — Telephone Encounter (Signed)
  Pt c/o swelling/edema: STAT if pt has developed SOB within 24 hours  If swelling, where is the swelling located? Feet, ankles and legs   How much weight have you gained and in what time span? No   Have you gained 2 pounds in a day or 5 pounds in a week? No   Do you have a log of your daily weights (if so, list)? None   Are you currently taking a fluid pill? No   Are you currently SOB? No   Have you traveled recently in a car or plane for an extended period of time? No   The patient said her feet, ankles, and legs are very swollen. Also, 2 nights ago she experienced pain from her shoulder blade to her back throughout the night.

## 2023-12-08 NOTE — Telephone Encounter (Signed)
 Patient reports for the past 2-3 weeks she has had swelling in her feet, legs and ankles. She reports the left leg is more swollen than the right, and notes swelling has been worse since starting amlodipine  last week.  She denies any SOB, CP, nausea, or change in diet. She elevates her legs as able with work and notes some improvement to edema with elevation.  She also notes about 2 nights ago she had a pain in her shoulder to her back, and it seemed to be worse when her BP was higher.  She reports she continues to get elevated BP readings, occasionally SBP 200s before medication and down to 150-170 about 2 hours after taking BP meds. She did not have any readings to report during call.  Will forward to Dr. [name] to review and advise.

## 2023-12-17 ENCOUNTER — Telehealth: Payer: Self-pay | Admitting: Cardiology

## 2023-12-17 ENCOUNTER — Encounter: Payer: Self-pay | Admitting: Cardiology

## 2023-12-17 NOTE — Telephone Encounter (Signed)
 Error

## 2023-12-17 NOTE — Telephone Encounter (Signed)
 Spoke to patient she stated she has been taking Aldactone  25 mg 1/2 tablet daily for the past 9 days B/P has been elevated.Readings listed below.B/P at present 155/57 pulse 63.Stated B/P higher in mornings.Stated yesterday around 12:00 noon she felt light headed B/P 100/57.Stated that was first time B/P has been low.Stated she does not have any swelling in legs now.She will have bmet done this week. Advised I will send message to Dr.Jordan for advice.

## 2023-12-17 NOTE — Telephone Encounter (Signed)
 Pt c/o BP issue: STAT if pt c/o blurred vision, one-sided weakness or slurred speech.   STAT if BP is GREATER than 180/120 TODAY.   STAT if BP is LESS than 90/60 and SYMPTOMATIC TODAY   1. What is your BP concern? Blood pressure has been up and down.    2. Have you taken any BP medication today?yes, spironolactone    3. What are your last 5 BP readings? 192/91 pulse 65  203/97 pulse 66   188/96  100/67 lightheaded     4. Are you having any other symptoms (ex. Dizziness, headache, blurred vision, passed out)? Lightheaded, sore neck

## 2023-12-17 NOTE — Telephone Encounter (Signed)
 Spoke to patient Dr.Jordan's advice given.She will have bmet done tomorrow.She will continue to monitor B/P.

## 2023-12-19 ENCOUNTER — Other Ambulatory Visit: Payer: Self-pay

## 2023-12-19 ENCOUNTER — Ambulatory Visit: Payer: Self-pay | Admitting: Cardiology

## 2023-12-19 DIAGNOSIS — I10 Essential (primary) hypertension: Secondary | ICD-10-CM

## 2023-12-19 LAB — BASIC METABOLIC PANEL WITH GFR
BUN/Creatinine Ratio: 16 (ref 12–28)
BUN: 21 mg/dL (ref 8–27)
CO2: 20 mmol/L (ref 20–29)
Calcium: 9.5 mg/dL (ref 8.7–10.3)
Chloride: 103 mmol/L (ref 96–106)
Creatinine, Ser: 1.29 mg/dL — ABNORMAL HIGH (ref 0.57–1.00)
Glucose: 94 mg/dL (ref 70–99)
Potassium: 4.8 mmol/L (ref 3.5–5.2)
Sodium: 140 mmol/L (ref 134–144)
eGFR: 44 mL/min/1.73 — ABNORMAL LOW (ref >59)

## 2023-12-19 NOTE — Telephone Encounter (Signed)
  Patient is calling to get result

## 2023-12-23 ENCOUNTER — Encounter: Payer: Self-pay | Admitting: Family Medicine

## 2023-12-23 ENCOUNTER — Ambulatory Visit: Admitting: Family Medicine

## 2023-12-23 VITALS — BP 148/80 | HR 60 | Temp 98.4°F | Wt 101.0 lb

## 2023-12-23 DIAGNOSIS — M542 Cervicalgia: Secondary | ICD-10-CM

## 2023-12-23 MED ORDER — METHYLPREDNISOLONE 4 MG PO TBPK: ORAL_TABLET | ORAL | 0 refills | Status: AC

## 2023-12-23 MED ORDER — CYCLOBENZAPRINE HCL 10 MG PO TABS: 10.0000 mg | ORAL_TABLET | Freq: Three times a day (TID) | ORAL | 1 refills | Status: AC | PRN

## 2023-12-23 NOTE — Progress Notes (Signed)
   Subjective:    Patient ID: Mackenzie [name], female    DOB: 14-Nov-1950, 73 y.o.   MRN: [id number]  HPI Here for 2 weeks of tightness and pain in the back of her neck, more on the left side than the right. Sometimes this extends up the back of her head and gives her a headache. No recent trauma. Heat helps but not Tylenol .    Review of Systems  Constitutional: Negative.   Respiratory: Negative.    Cardiovascular: Negative.   Musculoskeletal:  Positive for neck pain and neck stiffness.  Neurological:  Positive for headaches.       Objective:   Physical Exam Constitutional:      Appearance: Normal appearance.  Cardiovascular:     Rate and Rhythm: Normal rate. Rhythm irregular.     Pulses: Normal pulses.     Heart sounds: Normal heart sounds.  Pulmonary:     Effort: Pulmonary effort is normal.     Breath sounds: Normal breath sounds.  Musculoskeletal:     Comments: She is tender along the cervical spine and on both sides of the spine. ROM is full. There is substantial spasm in the left upper trapezius   Neurological:     Mental Status: She is alert.           Assessment & Plan:  Neck pain. Treat with Flexeril  and a Medrol  dose pack. Recheck as needed.  Garnette Olmsted, MD

## 2023-12-31 NOTE — Telephone Encounter (Signed)
 BP Readings 9/17 8:00 186/92 67 9/17 9:30 took meds 9/17 11:00 135/66 62  9/17 1:20 160/77 60 9/16 9 :00 184/91 68 Took meds 9/16 10:30 127/60 65 9/16 4:30 155/77 66 After meds  9/16 7:00 161/75 62 9/15 7:30141/84 75 9/15 5:30/127/71 69 Took meds 9/15 8:00 132/63 64

## 2024-01-13 DIAGNOSIS — I10 Essential (primary) hypertension: Secondary | ICD-10-CM | POA: Diagnosis not present

## 2024-01-14 LAB — BASIC METABOLIC PANEL WITH GFR
BUN/Creatinine Ratio: 16 (ref 12–28)
BUN: 26 mg/dL (ref 8–27)
CO2: 21 mmol/L (ref 20–29)
Calcium: 9.4 mg/dL (ref 8.7–10.3)
Chloride: 102 mmol/L (ref 96–106)
Creatinine, Ser: 1.65 mg/dL — ABNORMAL HIGH (ref 0.57–1.00)
Glucose: 93 mg/dL (ref 70–99)
Potassium: 4.4 mmol/L (ref 3.5–5.2)
Sodium: 138 mmol/L (ref 134–144)
eGFR: 32 mL/min/1.73 — ABNORMAL LOW (ref >59)

## 2024-01-18 ENCOUNTER — Ambulatory Visit: Payer: Self-pay | Admitting: Cardiology

## 2024-01-28 ENCOUNTER — Telehealth: Payer: Self-pay | Admitting: Cardiology

## 2024-01-28 NOTE — Telephone Encounter (Signed)
  Patient is returning call regarding results 

## 2024-01-28 NOTE — Telephone Encounter (Signed)
 Spoke with pt, aware to stop spironolactone .

## 2024-05-01 ENCOUNTER — Other Ambulatory Visit: Payer: Self-pay | Admitting: Cardiology

## 2024-05-20 NOTE — Progress Notes (Unsigned)
 "    Cardiology Office Note   Date:  05/20/2024   ID:  Mackenzie [name], Mackenzie [name] 1950/09/26, MRN [id number]  PCP:  Johnny Garnette LABOR, MD  Cardiologist:  Maylani Embree, MD EP: OLE ONEIDA HOLTS, MD (Inactive)  No chief complaint on file.      History of Present Illness: Mackenzie [name] is a 74 y.o. 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  She was evaluated by me on 02/24/2020 at which time she was doing okay from a cardiology standpoint.  She reported episodes of atrial fibrillation approximately once a month which would last for up to 3 days.  She had a particularly bad episode just prior to her visit with associated nausea, indigestion, and palpitations; felt very weak.  We discussed consideration for an ablation, however patient preferred watchful waiting and if more severe episodes would consider at that time.  Atrial fibrillation history dates back to 2016 when she was diagnosed after presenting with shortness of breath and palpitations.  Echo at that time showed EF 25 to 30%.  Nuclear stress test at that time showed no ischemia.  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  She underwent TEE/DCCV with successful restoration of NSR, however had recurrent A. fib within 1 month.  She was started on amiodarone  at that time.  She saw Dr. Kelsie with electrophysiology in 2020 for consideration of an ablation though did not go forward with scheduled procedure. She had subsequent recovery in her EF with sinus rhythm. Latest CT scan showed bibasilar scarring. She was later seen by Dr Kelsie with recurrent Afib despite amiodarone . She had ablation 01/16/21. Amiodarone  was later discontinued.   In February 2022 was seen in Afib clinic with increased Afib burden. She was scheduled for cardioversion but converted on her own. She did have a sleep study showing mild OSA.   She reports  she tried CPAP but hasn't been able to tolerate the mask.   She was seen  by Dr Holts for her recurrent Afib. Not felt to be a good candidate for Tikosyn due to CKD. Discussed trying amiodarone  versus repeat ablation.  She underwent afib ablation 06/03/22. Unfortunately, she was back in afib 2 days post ablation, confirmed on her Crist mobile device. She states she felt great for 1-2 days post procedure. She remained in Afib when seen in clinic on March 12. She was seen 09/11/22 by Dr. Holts and planned for cardioversion. She underwent DCCV with 200j on 09/16/22 with thought that if she went back into AF, she would be considered permanent atrial fibrillation and transition focus to rate control. She converted to SR but noted 6/10 she had returned to AF. She was seen in the AF Clinic 6/12, at that time discussion was related to initiating amiodarone  and re-trial of DCCV. She has maintained on Xarelto , lopressor  and follows her rhythm with [name]. She noted feeling much better in NSR so decision was made to reload with oral amiodarone  and repeat DCCV planned for August 18. Amiodarone  was reloaded on July 16. When I saw her she was back in NSR and DCCV was canceled.  She did undergo esophageal dilation in October. She states this helped with her swallowing. She states she did have a bad HA at work last week and this is often associated with high BP but she hasn't taken BP recently. Denies any palpitations.  Past Medical History:  Diagnosis Date   Chronic systolic CHF (congestive heart failure) (HCC)    a. 2D ECHO on 02/10/15 w/ severe LV dysfunction with EF 25-30%. Mild MR and mild LAE. negative nuclear stress test, felt to be due to tachycardia    Dementia (HCC)    Dyspnea    with exertion   Dysrhythmia    PAF   Esophageal stricture    a. requiring dilations. crushes pills to take PO   Full dentures    GERD (gastroesophageal reflux disease)    Hypertension    Osteoporosis    sees Dr.  Marcey Ore    PAF (paroxysmal atrial fibrillation) [facility])    a. s/p succesfful DCCV on 02/13/15   Pneumonia    (08/15/16)- many years ago   Sebaceous cyst    on back of neck, has seen Dr. Jeoffrey Dawn   Sleep apnea    does not use CPAP    Past Surgical History:  Procedure Laterality Date   ATRIAL FIBRILLATION ABLATION N/A 01/16/2021   Procedure: ATRIAL FIBRILLATION ABLATION;  Surgeon: Kelsie Agent, MD;  Location: [facility];  Service: Cardiovascular;  Laterality: N/A;   ATRIAL FIBRILLATION ABLATION N/A 06/03/2022   Procedure: ATRIAL FIBRILLATION ABLATION;  Surgeon: Cindie Ole DASEN, MD;  Location: [facility];  Service: Cardiovascular;  Laterality: N/A;   BALLOON DILATION N/A 06/22/2018   Procedure: ESOPHAGEAL BALLOON DILATION;  Surgeon: Carlie Clark, MD;  Location: [facility];  Service: ENT;  Laterality: N/A;   BALLOON DILATION N/A 02/10/2023   Procedure: MERRILL HODGKIN;  Surgeon: Carlie Clark, MD;  Location: [facility] OR;  Service: ENT;  Laterality: N/A;   BREAST SURGERY Bilateral    Breast Implants   CARDIOVERSION N/A 02/13/2015   Procedure: CARDIOVERSION;  Surgeon: Vinie JAYSON Maxcy, MD;  Location: [facility] END[facility]COPY;  Service: Cardiovascular;  Laterality: N/A;   CARDIOVERSION N/A 09/16/2022   Procedure: CARDIOVERSION;  Surgeon: Santo Stanly LABOR, MD;  Location: [facility];  Service: Cardiovascular;  Laterality: N/A;   COLONOSCOPY  09/24/2002   ESOPHAGEAL DILATION N/A 08/19/2016   Procedure: ESOPHAGEAL DILATION;  Surgeon: Carlie Clark, MD;  Location: [facility] OR;  Service: ENT;  Laterality: N/A;  esophagoscopy with balloon dilation   ESOPHAGOGASTRODUODENOSCOPY  06/01/2003   12/13   ESOPHAGOGASTRODUODENOSCOPY (EGD) WITH ESOPHAGEAL DILATION     ESOPHAGOSCOPY N/A 02/10/2023   Procedure: RIGID ESOPHAGOSCOPY;  Surgeon: Carlie Clark, MD;  Location: [facility] OR;  Service: ENT;  Laterality: N/A;   ESOPHAGOSCOPY WITH DILITATION N/A 06/16/2017   Procedure: ESOPHAGOSCOPY WITH  BALLOON DILITATION;  Surgeon: Carlie Clark, MD;  Location: [facility];  Service: ENT;  Laterality: N/A;   RIGID ESOPHAGOSCOPY N/A 01/18/2013   Procedure: RIGID ESOPHAGOSCOPY WITH ESPHAGEAL Balloon DILATION ;  Surgeon: Clark Carlie, MD;  Location: [facility];  Service: ENT;  Laterality: N/A;   RIGID ESOPHAGOSCOPY N/A 06/22/2018   Procedure: RIGID ESOPHAGOSCOPY;  Surgeon: Carlie Clark, MD;  Location: [facility];  Service: ENT;  Laterality: N/A;   TEE WITHOUT CARDIOVERSION N/A 02/13/2015   Procedure: TRANSESOPHAGEAL ECHOCARDIOGRAM (TEE);  Surgeon: Vinie JAYSON Maxcy, MD;  Location: [facility] ENDOSCOPY;  Service: Cardiovascular;  Laterality: N/A;     Current Outpatient Medications  Medication Sig Dispense Refill   amiodarone  (PACERONE ) 200 MG tablet TAKE 1 TABLET BY MOUTH EVERY DAY 90 tablet 1   carvedilol  (COREG ) 6.25 MG tablet TAKE 1 TABLET BY MOUTH TWICE A DAY WITH FOOD 180 tablet 1  cyclobenzaprine  (FLEXERIL ) 10 MG tablet Take 1 tablet (10 mg total) by mouth 3 (three) times daily as needed for muscle spasms. 60 tablet 1   losartan  (COZAAR ) 100 MG tablet Take 1 tablet (100 mg total) by mouth daily. 90 tablet 3   methylPREDNISolone  (MEDROL  DOSEPAK) 4 MG TBPK tablet As directed 21 tablet 0   omeprazole  (PRILOSEC) 40 MG capsule Take 1 capsule (40 mg total) by mouth daily. 90 capsule 3   XARELTO  15 MG TABS tablet TAKE 1 TABLET (15 MG TOTAL) BY MOUTH DAILY WITH SUPPER 90 tablet 1   No current facility-administered medications for this visit.    Allergies:   Ramipril     Social History:  The patient  reports that she has never smoked. She has never used smokeless tobacco. She reports that she does not drink alcohol and does not use drugs.   Family History:  The patient's family history includes Aneurysm in her father; Diabetes type II in her sister; Heart attack in her brother and brother; Heart attack (age of onset: 50) in her mother; Hypertension in her sister  and sister.    ROS:  Please see the history of present illness.   Otherwise, review of systems are positive for none.   All other systems are reviewed and negative.    PHYSICAL EXAM: VS:  There were no vitals taken for this visit. , BMI There is no height or weight on file to calculate BMI. GEN: Well nourished, well developed, in no acute distress HEENT: sclera anicteric Neck: no JVD, carotid bruits, or masses Cardiac: IRRR; no murmurs, rubs, or gallops, tr edema  Respiratory:  clear to auscultation bilaterally, normal work of breathing GI: soft, nontender, nondistended, + BS MS: no deformity or atrophy Skin: warm and dry, no rash Neuro:  Strength and sensation are intact Psych: euthymic mood, full affect     Recent Labs: 11/25/2023: ALT 17; Hemoglobin 11.5; Platelets 260; TSH 3.140 01/13/2024: BUN 26; Creatinine, Ser 1.65; Potassium 4.4; Sodium 138    Lipid Panel    Component Value Date/Time   CHOL 206 (H) 11/25/2023 1025   TRIG 98 11/25/2023 1025   HDL 74 11/25/2023 1025   CHOLHDL 2.8 11/25/2023 1025   CHOLHDL 3 06/26/2022 0843   VLDL 19.6 06/26/2022 0843   LDLCALC 115 (H) 11/25/2023 1025   LDLDIRECT 121.0 03/14/2010 0931      Wt Readings from Last 3 Encounters:  12/23/23 101 lb (45.8 kg)  11/25/23 101 lb (45.8 kg)  05/28/23 103 lb 3.2 oz (46.8 kg)         Other studies Reviewed: Additional studies/ records that were reviewed today include:   Echocardiogram 2019: Study Conclusions   - Left ventricle: The cavity size was normal. Wall thickness was    normal. Systolic function was normal. The estimated ejection    fraction was in the range of 55% to 60%. Wall motion was normal;    there were no regional wall motion abnormalities. Doppler    parameters are consistent with abnormal left ventricular    relaxation (grade 2 diastolic dysfunction). The E/e&' ratio is    >15, suggesting elevated LV filling pressure.  - Mitral valve: Mildly thickened leaflets . There  was trivial    regurgitation.  - Left atrium: The atrium was normal in size.  - Tricuspid valve: There was mild regurgitation.  - Pulmonary arteries: PA peak pressure: 34 mm Hg (S).  - Inferior vena cava: The vessel was normal in size. The  respirophasic diameter changes were in the normal range (>= 50%),    consistent with normal central venous pressure.   Impressions:   - Compared to a prior study in 2017, there are no significant    changes.   NST 2016: 1. No reversible ischemia or infarction. Small, mild fixed defect along the anterior apex.   2. Global hypokinesis.   3.  Decreased left ventricular ejection fraction of 21%.   4. High-risk stress test findings*.  Echo 10/27/20: IMPRESSIONS     1. Left ventricular ejection fraction, by estimation, is 60 to 65%. The  left ventricle has normal function. The left ventricle has no regional  wall motion abnormalities. There is mild asymmetric left ventricular  hypertrophy. Left ventricular diastolic  parameters are indeterminate. Elevated left ventricular end-diastolic  pressure.   2. Right ventricular systolic function is normal. The right ventricular  size is normal.   3. The mitral valve is normal in structure. Trivial mitral valve  regurgitation.   4. The aortic valve is normal in structure. Aortic valve regurgitation is  trivial.   Echo 4//17/24: IMPRESSIONS     1. Left ventricular ejection fraction, by estimation, is 50%. The left  ventricle has mildly decreased function. The left ventricle demonstrates  global hypokinesis. There is mild concentric left ventricular hypertrophy.  Left ventricular diastolic  parameters are indeterminate.   2. Right ventricular systolic function is mildly reduced. The right  ventricular size is normal. There is mildly elevated pulmonary artery  systolic pressure. The estimated right ventricular systolic pressure is  38.5 mmHg.   3. Left atrial size was mildly dilated.   4. The  mitral valve is normal in structure. Trivial mitral valve  regurgitation. No evidence of mitral stenosis.   5. The aortic valve is tricuspid. Aortic valve regurgitation is not  visualized. No aortic stenosis is present.   6. The inferior vena cava is dilated in size with >50% respiratory  variability, suggesting right atrial pressure of 8 mmHg.   7. The patient was in atrial fibrillation.   ASSESSMENT AND PLAN: Paroxysmal Afib- S/p Afib ablation in October 2022. - Recurrent Afib  -Continue Xarelto  for stroke ppx.   - on Coreg  for rate control - s/p repeat Ablation on Jun 03, 2022. Repeat DCCV in June but returned to Afib. Later confirmed return to NSR on higher amiodarone  dose - continue amiodarone  200 mg daily - will check labs today  2.  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  - most recent Echo with EF 50% in April 2024  - Continue losartan , and Coreg   3.  Labile hypertension:  - BP is quite high today - will increase losartan  to 100 mg daily - continue  Coreg  6.25 mg bid.  - previously became hypotensive on chlorthalidone  - if BP still running high would add amlodipine  2.5 mg daily - low sodium diet.   4.  Insomnia/daytime somnolence/snoring:  - OSA noted on sleep study - reports intolerance of CPAP   Disposition:   FU with me in 6 months  Signed, Reynald Woods, MD  05/20/2024 12:50 PM    "

## 2024-05-24 ENCOUNTER — Ambulatory Visit: Admitting: Cardiology
